# Patient Record
Sex: Female | Born: 1935 | Race: White | Hispanic: No | Marital: Married | Smoking: Former smoker
Health system: Southern US, Community
[De-identification: ages and names within clinical notes are randomized; demographics above are authoritative.]

## PROBLEM LIST (undated history)

## (undated) DIAGNOSIS — I10 Essential (primary) hypertension: Secondary | ICD-10-CM

## (undated) DIAGNOSIS — R32 Unspecified urinary incontinence: Secondary | ICD-10-CM

## (undated) DIAGNOSIS — F329 Major depressive disorder, single episode, unspecified: Secondary | ICD-10-CM

## (undated) DIAGNOSIS — F419 Anxiety disorder, unspecified: Secondary | ICD-10-CM

## (undated) DIAGNOSIS — F32A Depression, unspecified: Secondary | ICD-10-CM

## (undated) DIAGNOSIS — E785 Hyperlipidemia, unspecified: Secondary | ICD-10-CM

## (undated) DIAGNOSIS — M1990 Unspecified osteoarthritis, unspecified site: Secondary | ICD-10-CM

## (undated) DIAGNOSIS — G4733 Obstructive sleep apnea (adult) (pediatric): Secondary | ICD-10-CM

## (undated) DIAGNOSIS — F0390 Unspecified dementia without behavioral disturbance: Secondary | ICD-10-CM

## (undated) HISTORY — DX: Essential (primary) hypertension: I10

## (undated) HISTORY — DX: Hyperlipidemia, unspecified: E78.5

## (undated) HISTORY — DX: Major depressive disorder, single episode, unspecified: F32.9

## (undated) HISTORY — DX: Unspecified osteoarthritis, unspecified site: M19.90

## (undated) HISTORY — PX: NECK SURGERY: SHX720

## (undated) HISTORY — PX: ABDOMINAL HYSTERECTOMY: SHX81

## (undated) HISTORY — DX: Obstructive sleep apnea (adult) (pediatric): G47.33

## (undated) HISTORY — PX: APPENDECTOMY: SHX54

## (undated) HISTORY — DX: Unspecified dementia, unspecified severity, without behavioral disturbance, psychotic disturbance, mood disturbance, and anxiety: F03.90

## (undated) HISTORY — DX: Depression, unspecified: F32.A

## (undated) HISTORY — DX: Unspecified urinary incontinence: R32

## (undated) HISTORY — DX: Anxiety disorder, unspecified: F41.9

---

## 1998-03-18 ENCOUNTER — Other Ambulatory Visit: Admission: RE | Admit: 1998-03-18 | Discharge: 1998-03-18 | Payer: Self-pay | Admitting: Family Medicine

## 1999-04-23 ENCOUNTER — Other Ambulatory Visit: Admission: RE | Admit: 1999-04-23 | Discharge: 1999-04-23 | Payer: Self-pay | Admitting: Family Medicine

## 2000-01-12 ENCOUNTER — Encounter: Admission: RE | Admit: 2000-01-12 | Discharge: 2000-01-12 | Payer: Self-pay | Admitting: Family Medicine

## 2000-01-12 ENCOUNTER — Encounter: Payer: Self-pay | Admitting: Family Medicine

## 2000-06-27 ENCOUNTER — Other Ambulatory Visit: Admission: RE | Admit: 2000-06-27 | Discharge: 2000-06-27 | Payer: Self-pay | Admitting: Family Medicine

## 2001-01-12 ENCOUNTER — Encounter: Admission: RE | Admit: 2001-01-12 | Discharge: 2001-01-12 | Payer: Self-pay | Admitting: Internal Medicine

## 2001-01-12 ENCOUNTER — Encounter: Payer: Self-pay | Admitting: Internal Medicine

## 2001-02-15 ENCOUNTER — Encounter (INDEPENDENT_AMBULATORY_CARE_PROVIDER_SITE_OTHER): Payer: Self-pay | Admitting: Specialist

## 2001-02-15 ENCOUNTER — Emergency Department (HOSPITAL_COMMUNITY): Admission: EM | Admit: 2001-02-15 | Discharge: 2001-02-15 | Payer: Self-pay | Admitting: Emergency Medicine

## 2001-02-15 ENCOUNTER — Ambulatory Visit (HOSPITAL_COMMUNITY): Admission: RE | Admit: 2001-02-15 | Discharge: 2001-02-15 | Payer: Self-pay | Admitting: *Deleted

## 2001-10-26 ENCOUNTER — Other Ambulatory Visit: Admission: RE | Admit: 2001-10-26 | Discharge: 2001-10-26 | Payer: Self-pay | Admitting: Gynecology

## 2002-02-26 ENCOUNTER — Encounter: Payer: Self-pay | Admitting: Gynecology

## 2002-02-26 ENCOUNTER — Encounter: Admission: RE | Admit: 2002-02-26 | Discharge: 2002-02-26 | Payer: Self-pay | Admitting: Gynecology

## 2003-01-28 ENCOUNTER — Other Ambulatory Visit: Admission: RE | Admit: 2003-01-28 | Discharge: 2003-01-28 | Payer: Self-pay | Admitting: Gynecology

## 2003-03-03 ENCOUNTER — Encounter: Admission: RE | Admit: 2003-03-03 | Discharge: 2003-03-03 | Payer: Self-pay | Admitting: Gynecology

## 2003-03-03 ENCOUNTER — Encounter: Payer: Self-pay | Admitting: Gynecology

## 2003-06-07 LAB — HM COLONOSCOPY: HM Colonoscopy: NORMAL

## 2004-03-04 ENCOUNTER — Encounter: Admission: RE | Admit: 2004-03-04 | Discharge: 2004-03-04 | Payer: Self-pay | Admitting: Internal Medicine

## 2004-10-28 ENCOUNTER — Ambulatory Visit (HOSPITAL_COMMUNITY): Admission: RE | Admit: 2004-10-28 | Discharge: 2004-10-28 | Payer: Self-pay | Admitting: *Deleted

## 2004-12-17 ENCOUNTER — Encounter: Admission: RE | Admit: 2004-12-17 | Discharge: 2004-12-17 | Payer: Self-pay | Admitting: Internal Medicine

## 2005-03-21 ENCOUNTER — Encounter: Admission: RE | Admit: 2005-03-21 | Discharge: 2005-03-21 | Payer: Self-pay | Admitting: Internal Medicine

## 2005-04-14 ENCOUNTER — Other Ambulatory Visit: Admission: RE | Admit: 2005-04-14 | Discharge: 2005-04-14 | Payer: Self-pay | Admitting: Gynecology

## 2006-03-31 ENCOUNTER — Encounter: Admission: RE | Admit: 2006-03-31 | Discharge: 2006-03-31 | Payer: Self-pay | Admitting: Internal Medicine

## 2006-05-10 ENCOUNTER — Other Ambulatory Visit: Admission: RE | Admit: 2006-05-10 | Discharge: 2006-05-10 | Payer: Self-pay | Admitting: Gynecology

## 2007-01-03 ENCOUNTER — Encounter: Payer: Self-pay | Admitting: Pulmonary Disease

## 2007-02-22 ENCOUNTER — Encounter: Payer: Self-pay | Admitting: Pulmonary Disease

## 2007-07-19 ENCOUNTER — Encounter: Admission: RE | Admit: 2007-07-19 | Discharge: 2007-07-19 | Payer: Self-pay | Admitting: Internal Medicine

## 2008-01-07 ENCOUNTER — Telehealth: Payer: Self-pay | Admitting: Family Medicine

## 2008-03-19 ENCOUNTER — Encounter: Payer: Self-pay | Admitting: Family Medicine

## 2008-03-26 ENCOUNTER — Ambulatory Visit: Payer: Self-pay | Admitting: Family Medicine

## 2008-03-26 DIAGNOSIS — G43009 Migraine without aura, not intractable, without status migrainosus: Secondary | ICD-10-CM | POA: Insufficient documentation

## 2008-03-26 DIAGNOSIS — E785 Hyperlipidemia, unspecified: Secondary | ICD-10-CM

## 2008-03-26 DIAGNOSIS — J309 Allergic rhinitis, unspecified: Secondary | ICD-10-CM | POA: Insufficient documentation

## 2008-03-26 DIAGNOSIS — I10 Essential (primary) hypertension: Secondary | ICD-10-CM

## 2008-04-09 ENCOUNTER — Ambulatory Visit: Payer: Self-pay | Admitting: Family Medicine

## 2008-04-09 DIAGNOSIS — M545 Low back pain: Secondary | ICD-10-CM

## 2008-04-16 ENCOUNTER — Telehealth: Payer: Self-pay | Admitting: Family Medicine

## 2008-06-16 ENCOUNTER — Ambulatory Visit: Payer: Self-pay | Admitting: Family Medicine

## 2008-06-16 LAB — CONVERTED CEMR LAB
ALT: 19 units/L (ref 0–35)
AST: 21 units/L (ref 0–37)
Albumin: 3.5 g/dL (ref 3.5–5.2)
Alkaline Phosphatase: 63 units/L (ref 39–117)
BUN: 19 mg/dL (ref 6–23)
Calcium: 9.4 mg/dL (ref 8.4–10.5)
Cholesterol: 276 mg/dL (ref 0–200)
Creatinine, Ser: 0.7 mg/dL (ref 0.4–1.2)
GFR calc Af Amer: 105 mL/min
GFR calc non Af Amer: 86 mL/min
Glucose, Bld: 98 mg/dL (ref 70–99)
Sodium: 140 meq/L (ref 135–145)
Triglycerides: 198 mg/dL — ABNORMAL HIGH (ref 0–149)

## 2008-06-20 ENCOUNTER — Ambulatory Visit: Payer: Self-pay | Admitting: Family Medicine

## 2008-06-20 LAB — CONVERTED CEMR LAB: Cholesterol, target level: 200 mg/dL

## 2008-07-21 ENCOUNTER — Encounter: Admission: RE | Admit: 2008-07-21 | Discharge: 2008-07-21 | Payer: Self-pay | Admitting: Family Medicine

## 2008-07-22 ENCOUNTER — Encounter (INDEPENDENT_AMBULATORY_CARE_PROVIDER_SITE_OTHER): Payer: Self-pay | Admitting: *Deleted

## 2008-07-31 ENCOUNTER — Encounter (INDEPENDENT_AMBULATORY_CARE_PROVIDER_SITE_OTHER): Payer: Self-pay | Admitting: *Deleted

## 2008-08-08 ENCOUNTER — Telehealth: Payer: Self-pay | Admitting: Family Medicine

## 2008-09-26 ENCOUNTER — Ambulatory Visit: Payer: Self-pay | Admitting: Family Medicine

## 2008-09-26 DIAGNOSIS — F341 Dysthymic disorder: Secondary | ICD-10-CM

## 2008-09-29 LAB — CONVERTED CEMR LAB
ALT: 21 units/L (ref 0–35)
AST: 26 units/L (ref 0–37)
Alkaline Phosphatase: 65 units/L (ref 39–117)
Bilirubin, Direct: 0.1 mg/dL (ref 0.0–0.3)
Folate: >20 ng/mL
TSH: 1.35 microintl units/mL (ref 0.35–5.50)
Total Bilirubin: 0.8 mg/dL (ref 0.3–1.2)
Total Protein: 7 g/dL (ref 6.0–8.3)
Vitamin B-12: 906 pg/mL (ref 211–911)

## 2009-04-08 ENCOUNTER — Ambulatory Visit: Payer: Self-pay | Admitting: Family Medicine

## 2009-04-08 DIAGNOSIS — F0390 Unspecified dementia without behavioral disturbance: Secondary | ICD-10-CM | POA: Insufficient documentation

## 2009-04-09 ENCOUNTER — Encounter: Payer: Self-pay | Admitting: Family Medicine

## 2009-04-09 LAB — CONVERTED CEMR LAB
AST: 29 units/L (ref 0–37)
Albumin: 3.6 g/dL (ref 3.5–5.2)
Alkaline Phosphatase: 68 units/L (ref 39–117)
Basophils Relative: 0.5 % (ref 0.0–3.0)
CO2: 30 meq/L (ref 19–32)
Chloride: 103 meq/L (ref 96–112)
Direct LDL: 213.3 mg/dL
Folate: >20 ng/mL
HCT: 42.5 % (ref 36.0–46.0)
HDL: 36.9 mg/dL — ABNORMAL LOW (ref >39.00)
Hemoglobin: 14.5 g/dL (ref 12.0–15.0)
Lymphocytes Relative: 34.3 % (ref 12.0–46.0)
MCHC: 34.1 g/dL (ref 30.0–36.0)
MCV: 92.3 fL (ref 78.0–100.0)
Monocytes Absolute: 0.7 10*3/uL (ref 0.1–1.0)
Monocytes Relative: 15.5 % — ABNORMAL HIGH (ref 3.0–12.0)
Neutrophils Relative %: 45.4 % (ref 43.0–77.0)
Potassium: 3.9 meq/L (ref 3.5–5.1)
RBC: 4.61 M/uL (ref 3.87–5.11)
RDW: 12.8 % (ref 11.5–14.6)
Total Bilirubin: 0.6 mg/dL (ref 0.3–1.2)
Total Protein: 7.3 g/dL (ref 6.0–8.3)
Triglycerides: 164 mg/dL — ABNORMAL HIGH (ref 0.0–149.0)
Vitamin B-12: 921 pg/mL — ABNORMAL HIGH (ref 211–911)

## 2009-04-10 LAB — CONVERTED CEMR LAB: Vit D, 25-Hydroxy: 33 ng/mL (ref 30–89)

## 2009-04-17 ENCOUNTER — Ambulatory Visit: Payer: Self-pay | Admitting: Pulmonary Disease

## 2009-04-17 DIAGNOSIS — G4733 Obstructive sleep apnea (adult) (pediatric): Secondary | ICD-10-CM

## 2009-04-22 ENCOUNTER — Other Ambulatory Visit: Admission: RE | Admit: 2009-04-22 | Discharge: 2009-04-22 | Payer: Self-pay | Admitting: Gynecology

## 2009-04-22 ENCOUNTER — Encounter: Payer: Self-pay | Admitting: Family Medicine

## 2009-04-22 ENCOUNTER — Ambulatory Visit: Payer: Self-pay | Admitting: Gynecology

## 2009-05-04 ENCOUNTER — Encounter (INDEPENDENT_AMBULATORY_CARE_PROVIDER_SITE_OTHER): Payer: Self-pay | Admitting: *Deleted

## 2009-05-04 LAB — CONVERTED CEMR LAB
ALT: 15 units/L
Cholesterol: 307 mg/dL
HDL: 33 mg/dL

## 2009-05-14 ENCOUNTER — Ambulatory Visit: Payer: Self-pay | Admitting: Family Medicine

## 2009-05-22 ENCOUNTER — Telehealth: Payer: Self-pay | Admitting: Family Medicine

## 2009-06-25 ENCOUNTER — Encounter: Payer: Self-pay | Admitting: Pulmonary Disease

## 2009-07-06 ENCOUNTER — Encounter: Payer: Self-pay | Admitting: Pulmonary Disease

## 2009-07-13 ENCOUNTER — Ambulatory Visit: Payer: Self-pay | Admitting: Pulmonary Disease

## 2009-07-16 ENCOUNTER — Ambulatory Visit: Payer: Self-pay | Admitting: Family Medicine

## 2009-07-22 LAB — CONVERTED CEMR LAB
ALT: 25 units/L (ref 0–35)
AST: 25 units/L (ref 0–37)
HDL: 44.1 mg/dL (ref >39.00)
Total CHOL/HDL Ratio: 6
Triglycerides: 228 mg/dL — ABNORMAL HIGH (ref 0.0–149.0)

## 2009-07-29 ENCOUNTER — Encounter: Admission: RE | Admit: 2009-07-29 | Discharge: 2009-07-29 | Payer: Self-pay | Admitting: Family Medicine

## 2009-08-04 ENCOUNTER — Ambulatory Visit: Payer: Self-pay | Admitting: Gynecology

## 2009-08-10 ENCOUNTER — Telehealth (INDEPENDENT_AMBULATORY_CARE_PROVIDER_SITE_OTHER): Payer: Self-pay | Admitting: *Deleted

## 2009-08-14 ENCOUNTER — Telehealth: Payer: Self-pay | Admitting: Family Medicine

## 2009-08-20 ENCOUNTER — Encounter: Payer: Self-pay | Admitting: Family Medicine

## 2009-08-21 ENCOUNTER — Ambulatory Visit: Payer: Self-pay | Admitting: Family Medicine

## 2009-08-21 ENCOUNTER — Telehealth: Payer: Self-pay | Admitting: Family Medicine

## 2009-08-28 ENCOUNTER — Ambulatory Visit: Payer: Self-pay | Admitting: Family Medicine

## 2009-10-19 ENCOUNTER — Ambulatory Visit: Payer: Self-pay | Admitting: Family Medicine

## 2009-10-20 LAB — CONVERTED CEMR LAB
AST: 20 units/L (ref 0–37)
Direct LDL: 109.4 mg/dL
HDL: 43.7 mg/dL (ref >39.00)
VLDL: 44 mg/dL — ABNORMAL HIGH (ref 0.0–40.0)

## 2009-10-28 ENCOUNTER — Telehealth: Payer: Self-pay | Admitting: Family Medicine

## 2009-11-04 ENCOUNTER — Encounter: Payer: Self-pay | Admitting: Pulmonary Disease

## 2009-11-16 ENCOUNTER — Ambulatory Visit: Payer: Self-pay | Admitting: Family Medicine

## 2009-11-16 DIAGNOSIS — K219 Gastro-esophageal reflux disease without esophagitis: Secondary | ICD-10-CM | POA: Insufficient documentation

## 2010-01-12 ENCOUNTER — Telehealth: Payer: Self-pay | Admitting: Pulmonary Disease

## 2010-01-15 ENCOUNTER — Telehealth: Payer: Self-pay | Admitting: Pulmonary Disease

## 2010-02-03 ENCOUNTER — Ambulatory Visit: Payer: Self-pay | Admitting: Pulmonary Disease

## 2010-04-20 ENCOUNTER — Ambulatory Visit: Payer: Self-pay | Admitting: Family Medicine

## 2010-04-20 LAB — CONVERTED CEMR LAB
Bilirubin Urine: NEGATIVE
Blood in Urine, dipstick: NEGATIVE
Glucose, Urine, Semiquant: NEGATIVE
Ketones, urine, test strip: NEGATIVE

## 2010-04-21 LAB — CONVERTED CEMR LAB
ALT: 20 units/L (ref 0–35)
Alkaline Phosphatase: 70 units/L (ref 39–117)
BUN: 16 mg/dL (ref 6–23)
Cholesterol: 227 mg/dL — ABNORMAL HIGH (ref 0–200)
Creatinine, Ser: 1 mg/dL (ref 0.4–1.2)
GFR calc non Af Amer: 60.45 mL/min (ref >60)
Glucose, Bld: 88 mg/dL (ref 70–99)
HDL: 47.8 mg/dL (ref >39.00)
Potassium: 4.4 meq/L (ref 3.5–5.1)
Total Bilirubin: 0.4 mg/dL (ref 0.3–1.2)
Total Protein: 6.6 g/dL (ref 6.0–8.3)
Triglycerides: 189 mg/dL — ABNORMAL HIGH (ref 0.0–149.0)
VLDL: 37.8 mg/dL (ref 0.0–40.0)

## 2010-06-24 ENCOUNTER — Telehealth (INDEPENDENT_AMBULATORY_CARE_PROVIDER_SITE_OTHER): Payer: Self-pay | Admitting: *Deleted

## 2010-06-26 ENCOUNTER — Other Ambulatory Visit: Payer: Self-pay | Admitting: Family Medicine

## 2010-06-26 DIAGNOSIS — Z1239 Encounter for other screening for malignant neoplasm of breast: Secondary | ICD-10-CM

## 2010-06-27 ENCOUNTER — Encounter: Payer: Self-pay | Admitting: Gynecology

## 2010-06-29 ENCOUNTER — Ambulatory Visit
Admission: RE | Admit: 2010-06-29 | Discharge: 2010-06-29 | Payer: Self-pay | Source: Home / Self Care | Attending: Family Medicine | Admitting: Family Medicine

## 2010-06-29 DIAGNOSIS — R51 Headache: Secondary | ICD-10-CM | POA: Insufficient documentation

## 2010-06-29 DIAGNOSIS — R519 Headache, unspecified: Secondary | ICD-10-CM | POA: Insufficient documentation

## 2010-07-01 ENCOUNTER — Encounter
Admission: RE | Admit: 2010-07-01 | Discharge: 2010-07-01 | Payer: Self-pay | Source: Home / Self Care | Attending: Family Medicine | Admitting: Family Medicine

## 2010-07-06 NOTE — Progress Notes (Signed)
Summary: Medical Records requested...  Phone Note From Other Clinic   Summary of Call: Pt was referred to [facility] (Neurologis) for Memory Loss on  March 25th.Marland KitchenMarland KitchenPt was seen by the Dr. Quentin Mulling  on Apri 5th. Pt was not orginally referred by Dr. Campbell Lerner per [facility],   they were not authorizated to send Medical Records to our office. Called pt to request Medical Records.Daine Gip  Oct 28, 2009 3:12 PM     Initial call taken by: Daine Gip,  Oct 28, 2009 3:12 PM

## 2010-07-06 NOTE — Progress Notes (Signed)
Summary: pain in left breast   Phone Note Call from Patient   Caller: Patient Call For: Kerby Nora MD Summary of Call: Patient says that she has been having a cramping pain in her left breast, no SOB, it has been happening for about a week at random times of the day. She says that after she takes about 7 tums the pain stops after about 20 min. Wants to know if she needs to be seen for this.  Please advise. Initial call taken by: Melody Comas,  August 14, 2009 1:49 PM  Follow-up for Phone Call        call patient and discuss, improved with TUMS  f/u Dr. Josie Dixon is reasonable Follow-up by: Hannah Beat MD,  August 14, 2009 2:28 PM  Additional Follow-up for Phone Call Additional follow up Details #1::        Spoke with pt's husband, he said pt gets confused.  Advised him to have pt call to schedule appt for next week. Additional Follow-up by: Lowella Petties CMA,  August 14, 2009 2:34 PM    Additional Follow-up for Phone Call Additional follow up Details #2::    Spoke with patient.  She is having no SOB, no chest pain, no wheezing.  No specific symptoms except pain in her breast, she doesn't drink caffeine.  Advised patient if her symptoms get any worse or she develops SOB, wheezing, chest pain, or her symptoms changed over the weekend to go to the ER.  Let us know if she doesn't get any better before her appt on 08/21/2009 with Dr. Ermalene Searing.  Linde Gillis CMA Duncan Dull)  August 14, 2009 2:43 PM

## 2010-07-06 NOTE — Letter (Signed)
Summary: CMN for CPAP Supplies/[facility]  CMN for CPAP Supplies/[facility]   Imported By: Sherian Rein 07/07/2009 09:38:52  _____________________________________________________________________  External Attachment:    Type:   Image     Comment:   External Document

## 2010-07-06 NOTE — Letter (Signed)
Summary: CMN/[facility]  CMN/[facility]   Imported By: Lester Tacna 11/10/2009 11:01:18  _____________________________________________________________________  External Attachment:    Type:   Image     Comment:   External Document

## 2010-07-06 NOTE — Assessment & Plan Note (Signed)
Summary: rov for osa   Copy to:  Kerby Nora Primary Provider/Referring Provider:  Kerby Nora MD  CC:  Pt is here for a 6 month f/u appt on her OSA.  Pt states she is wearing her cpap machine every night.  Pt states she set her cpap machine to read "4" at night when she uses it.  Pt wears her cpap machine approx 5 to 8 hours per night.  Pt c/o waking up with headaches.  Pt does c/o nasal dryness.  Pt denied sore throat. Marland Kitchen  History of Present Illness: The pt comes in today for f/iu of her osa.  We never received her download for pressure optimization, but have obtained today.  She has had adequate compliance, and her pressure needs to be 14cm of water.  The pt continues to have am headaches, but does not feel her mask is too tight.  She has slept better on cpap, but unsure if it has made a significant difference.  I have reminded her it needs to be set at optimal pressure.  She has lost 5 pounds since last visit.  Current Medications (verified): 1)  Fexofenadine Hcl 180 Mg Tabs (Fexofenadine Hcl) .... Take 1 Tablet By Mouth Once A Day 2)  Amlodipine Besy-Benazepril Hcl 5-10 Mg Caps (Amlodipine Besy-Benazepril Hcl) .... Take 1 Tablet By Mouth Once A Day 3)  Lovaza 1 Gm Caps (Omega-3-Acid Ethyl Esters) .... Take 4 Capsules By Mouth Daily 4)  Aspirin 81 Mg  Tabs (Aspirin) .... Take 2 By Mouth Once Daily 5)  Multivitamins   Tabs (Multiple Vitamin) .... Take 1 Tablet By Mouth Once A Day 6)  Calcium Carbonate-Vitamin D 600-400 Mg-Unit  Tabs (Calcium Carbonate-Vitamin D) .... Take 2 By Mouth Once Daily 7)  Glucosamine-Chondroitin   Caps (Glucosamine-Chondroit-Vit C-Mn) .... Take 2 By Mouth Once Daily 8)  Simvastatin 40 Mg Tabs (Simvastatin) .... Take 1 Tablet By Mouth Once A Day 9)  Lecithin 1200 Mg Caps (Lecithin) .... Takes 2 Tabs Daily 10)  Omeprazole 40 Mg Cpdr (Omeprazole) .Marland Kitchen.. 1 Tab By Mouth Daily 11)  Sertraline Hcl 50 Mg Tabs (Sertraline Hcl) .Marland Kitchen.. 1 Tab By Mouth At Bedtime 12)  Paroxetine Hcl  10 Mg Tabs (Paroxetine Hcl) .... Take 1 Tablet By Mouth Once A Day  Allergies (verified): 1)  ! Sulfa  Review of Systems       The patient complains of non-productive cough and headaches.  The patient denies shortness of breath with activity, shortness of breath at rest, productive cough, coughing up blood, chest pain, irregular heartbeats, acid heartburn, indigestion, loss of appetite, weight change, abdominal pain, difficulty swallowing, sore throat, tooth/dental problems, nasal congestion/difficulty breathing through nose, sneezing, itching, ear ache, anxiety, depression, hand/feet swelling, joint stiffness or pain, rash, change in color of mucus, and fever.    Vital Signs:  Patient profile:   77 year old female Height:      63 inches Weight:      182.25 pounds BMI:     32.40 O2 Sat:      95 % on Room air Temp:     98.1 degrees F oral Pulse rate:   83 / minute BP sitting:   114 / 70  (left arm) Cuff size:   regular  Vitals Entered By: Arman Filter LPN (February 03, 2010 9:45 AM)  O2 Flow:  Room air CC: Pt is here for a 6 month f/u appt on her OSA.  Pt states she is wearing her cpap machine every  night.  Pt states she set her cpap machine to read "4" at night when she uses it.  Pt wears her cpap machine approx 5 to 8 hours per night.  Pt c/o waking up with headaches.  Pt does c/o nasal dryness.  Pt denied sore throat.  Comments Medications reviewed with patient Arman Filter LPN  February 03, 2010 9:45 AM    Physical Exam  General:  obese female in nad Nose:  no skin breakdown or pressure necrosis from cpap mask Extremities:  mild edema noted, no cyanosis  Neurologic:  alert and oriented, does not appear sleepy, moves all 4.   Impression & Recommendations:  Problem # 1:  OBSTRUCTIVE SLEEP APNEA (ICD-327.23) the pt has been wearing cpap compliantly, but needs a higher pressure than where she is currently set.  Will have her dme increase her pressure to 14cm, and see how she  responds.  I have also encouraged her to work on modest weight loss as well.  If the pt continues to have am headaches despite wearing cpap at appropriate pressure, will need to consider other causes.  Medications Added to Medication List This Visit: 1)  Paroxetine Hcl 10 Mg Tabs (Paroxetine hcl) .... Take 1 tablet by mouth once a day  Other Orders: Est. Patient Level III (09811) DME Referral (DME)  Patient Instructions: 1)  will have your equipment company set your machine pressure to 14cm. 2)  if you continue to have morning headaches, despite wearing cpap at the appropriate pressure, would discuss with Dr Ermalene Searing other potential causes.   3)  work on weight loss 4)  followup with me in 1 year, but call if having cpap/sleep issues.

## 2010-07-06 NOTE — Progress Notes (Signed)
Summary: nos appt  Phone Note Call from Patient   Caller: [email or web address]  Call For: clance Summary of Call: Rsc nos from 8/11 to 8/31 @ 9:45a. Initial call taken by: Darletta Moll,  January 15, 2010 1:16 PM

## 2010-07-06 NOTE — Assessment & Plan Note (Signed)
Summary: 1 wk f/u dlo   Vital Signs:  Patient profile:   77 year old female Height:      63 inches Weight:      185.0 pounds BMI:     32.89 Temp:     97.8 degrees F oral Pulse rate:   80 / minute Pulse rhythm:   regular BP sitting:   120 / 80  (left arm) Cuff size:   large  Vitals Entered By: Benny Lennert CMA Duncan Dull) (August 28, 2009 9:33 AM)  History of Present Illness: Chief complaint 1 week follow up  Chest Pain, atypical: improved chest pain and stomach issues. Using PPI  Dementia, poor control per husband. Patient has noted as well.  Initial work up negative. Has neurologist... Dr. Ferne Reus. Difficulty finding places..for example finding office this AM. Repeaeting questions. Confusion about meds.  MMSE 25/30  Sleep apnea..now on CPAP  Depression, poor controll off sertraline.   Problems Prior to Update: 1)  Chest Pain, Atypical  (ICD-786.59) 2)  Breast Pain, Bilateral  (ICD-611.71) 3)  Memory Loss  (ICD-780.93) 4)  Anxiety Depression  (ICD-300.4) 5)  Other Screening Mammogram  (ICD-V76.12) 6)  Low Back Pain, Acute  (ICD-724.2) 7)  Hypertension  (ICD-401.9) 8)  Hyperlipidemia  (ICD-272.4) 9)  Allergic Rhinitis  (ICD-477.9) 10)  Obstructive Sleep Apnea  (ICD-327.23) 11)  Common Migraine  (ICD-346.10)  Current Medications (verified): 1)  Fexofenadine Hcl 180 Mg Tabs (Fexofenadine Hcl) .... Take 1 Tablet By Mouth Once A Day 2)  Amlodipine Besy-Benazepril Hcl 5-10 Mg Caps (Amlodipine Besy-Benazepril Hcl) .... Take 1 Tablet By Mouth Once A Day 3)  Lovaza 1 Gm Caps (Omega-3-Acid Ethyl Esters) .... Take 4 Capsules By Mouth Daily 4)  Aspirin 81 Mg  Tabs (Aspirin) .... Take 2 By Mouth Once Daily 5)  Multivitamins   Tabs (Multiple Vitamin) .... Take 1 Tablet By Mouth Once A Day 6)  Calcium Carbonate-Vitamin D 600-400 Mg-Unit  Tabs (Calcium Carbonate-Vitamin D) .... Take 2 By Mouth Once Daily 7)  Glucosamine-Chondroitin   Caps (Glucosamine-Chondroit-Vit C-Mn) .... Take 2  By Mouth Once Daily 8)  Simvastatin 40 Mg Tabs (Simvastatin) .... Take 1 Tablet By Mouth Once A Day 9)  Lecithin 1200 Mg Caps (Lecithin) .... Takes 2 Tabs Daily 10)  Omeprazole 40 Mg Cpdr (Omeprazole) .Marland Kitchen.. 1 Tab By Mouth Daily 11)  Sertraline Hcl 50 Mg Tabs (Sertraline Hcl) .Marland Kitchen.. 1 Tab By Mouth At Bedtime  Allergies: 1)  ! Sulfa  Past History:  Past medical, surgical, family and social histories (including risk factors) reviewed, and no changes noted (except as noted below).  Past Medical History: Reviewed history from 04/17/2009 and no changes required.  MEMORY LOSS (ICD-780.93) ANXIETY DEPRESSION (ICD-300.4) LOW BACK PAIN, ACUTE (ICD-724.2) HYPERTENSION (ICD-401.9) HYPERLIPIDEMIA (ICD-272.4) ALLERGIC RHINITIS (ICD-477.9) OBSTRUCTIVE SLEEP APNEA (ICD-327.23) COMMON MIGRAINE (ICD-346.10)    Past Surgical History: Reviewed history from 04/17/2009 and no changes required. sleep study 03/2008 partial hysterectomy, for fibroids Appendectomy neck surgery   Family History: Reviewed history from 03/26/2008 and no changes required. father: MI age 41s, alcoholic mother: lymphoma brother: no contact MGM: CAD MGF: sudden death  Social History: Reviewed history from 04/17/2009 and no changes required. Retired Married 26 years, husband 21 years younger pt has children. pt is a former smoker.  started smoking around age 46.  approx 2 ppd.  smoked x 8 years.  Alcohol use-no Drug use-no Regular exercise-no Diet: working on healthy eating, eats a lot of Slim Fast  Review of Systems General:  Denies fatigue and fever. CV:  Denies fainting and swelling of feet. Resp:  Denies shortness of breath. GI:  Denies abdominal pain. GU:  Denies dysuria.  Physical Exam  General:  pleasnt overweight feamle iN NAD Mouth:  MMM Neck:  no carotid bruit or thyromegaly no cervical or supraclavicular lymphadenopathy  Lungs:  Normal respiratory effort, chest expands symmetrically. Lungs are  clear to auscultation, no crackles or wheezes. Heart:  Normal rate and regular rhythm. S1 and S2 normal without gallop, murmur, click, rub or other extra sounds. Abdomen:  epigastric ttp, soft, normal bowel sounds, no distention, no masses, no guarding, no rebound tenderness, no hepatomegaly, and no splenomegaly.   Neurologic:  No cranial nerve deficits noted. Station and gait are normal. Plantar reflexes are down-going bilaterally. DTRs are symmetrical throughout. Sensory, motor and coordinative functions appear intact. oriented to self and place NOT TIME Psych:  Oriented X3, memory intact for recent and remote, and tearful.     Impression & Recommendations:  Problem # 1:  CHEST PAIN, ATYPICAL (ICD-786.59) Improving off diclofenac and on PPI. Continue.   Problem # 2:  MEMORY LOSS (ICD-780.93) Early onset..fairly quickly progressive in last 6-9 months per husband. LAb eval negative. Will refer to neuro for further eval.  Sleep apnea now treated.  Orders: Neurology Referral (Neuro)  Problem # 3:  ANXIETY DEPRESSION (ICD-300.4) Restart sertraline at bedtime. Follow up in 1 month.   Complete Medication List: 1)  Fexofenadine Hcl 180 Mg Tabs (Fexofenadine hcl) .... Take 1 tablet by mouth once a day 2)  Amlodipine Besy-benazepril Hcl 5-10 Mg Caps (Amlodipine besy-benazepril hcl) .... Take 1 tablet by mouth once a day 3)  Lovaza 1 Gm Caps (Omega-3-acid ethyl esters) .... Take 4 capsules by mouth daily 4)  Aspirin 81 Mg Tabs (Aspirin) .... Take 2 by mouth once daily 5)  Multivitamins Tabs (Multiple vitamin) .... Take 1 tablet by mouth once a day 6)  Calcium Carbonate-vitamin D 600-400 Mg-unit Tabs (Calcium carbonate-vitamin d) .... Take 2 by mouth once daily 7)  Glucosamine-chondroitin Caps (Glucosamine-chondroit-vit c-mn) .... Take 2 by mouth once daily 8)  Simvastatin 40 Mg Tabs (Simvastatin) .... Take 1 tablet by mouth once a day 9)  Lecithin 1200 Mg Caps (Lecithin) .... Takes 2 tabs  daily 10)  Omeprazole 40 Mg Cpdr (Omeprazole) .Marland Kitchen.. 1 tab by mouth daily 11)  Sertraline Hcl 50 Mg Tabs (Sertraline hcl) .Marland Kitchen.. 1 tab by mouth at bedtime  Patient Instructions: 1)  Please schedule a follow-up appointment in 1 month 30 min OV. 2)  Continue omeprazole and restart sertraline for depression. 3)  Referral Appointment Information 4)  Day/Date: 5)  Time: 6)  Place/MD: 7)  Address: 8)  Phone/Fax: 9)  Patient given appointment information. Information/Orders faxed/mailed.  Prescriptions: SERTRALINE HCL 50 MG TABS (SERTRALINE HCL) 1 tab by mouth at bedtime  #30 x 5   Entered and Authorized by:   Kerby Nora MD   Signed by:   Kerby Nora MD on 08/28/2009   Method used:   [facility] to        [facility]  [address] [phone]ail)       [address]       [place], [place]  [address]       Ph: [phone]       Fax: [phone]   RxID:   [id number]   Current Allergies (reviewed today): ! SULFA

## 2010-07-06 NOTE — Assessment & Plan Note (Signed)
Summary: PAIN IN BREAST/NT   Vital Signs:  Patient profile:   77 year old female Height:      63 inches Weight:      182.8 pounds BMI:     32.50 Temp:     98.1 degrees F oral Pulse rate:   87 / minute Pulse rhythm:   regular BP sitting:   126 / 90  (left arm) Cuff size:   large  Vitals Entered By: Benny Lennert CMA Duncan Dull) (August 21, 2009 8:44 AM)  History of Present Illness: Chief complaint pain in breast and stomach   Central chest pain radiates in B breasts...gradual onset..worse in last 2 days.  Not sure how long prior. Lasts 2 hours.  OCc better with TUMS.  No relationship to food or exertion. No nausea, no burping, no vomiting. No SOB. no radiation to neck or arm.  Recently found diclofenac and cyclobenzaprine ..thought she should be taking these so restarted. Not sure when restarted these meds.   Last 07/2009 no breast lesions.  Has not had recent  stress test per records and pt memory.   Problems Prior to Update: 1)  Memory Loss  (ICD-780.93) 2)  Anxiety Depression  (ICD-300.4) 3)  Other Screening Mammogram  (ICD-V76.12) 4)  Low Back Pain, Acute  (ICD-724.2) 5)  Hypertension  (ICD-401.9) 6)  Hyperlipidemia  (ICD-272.4) 7)  Allergic Rhinitis  (ICD-477.9) 8)  Obstructive Sleep Apnea  (ICD-327.23) 9)  Common Migraine  (ICD-346.10)  Current Medications (verified): 1)  Fexofenadine Hcl 180 Mg Tabs (Fexofenadine Hcl) .... Take 1 Tablet By Mouth Once A Day 2)  Amlodipine Besy-Benazepril Hcl 5-10 Mg Caps (Amlodipine Besy-Benazepril Hcl) .... Take 1 Tablet By Mouth Once A Day 3)  Lovaza 1 Gm Caps (Omega-3-Acid Ethyl Esters) .... Take 4 Capsules By Mouth Daily 4)  Aspirin 81 Mg  Tabs (Aspirin) .... Take 2 By Mouth Once Daily 5)  Multivitamins   Tabs (Multiple Vitamin) .... Take 1 Tablet By Mouth Once A Day 6)  Calcium Carbonate-Vitamin D 600-400 Mg-Unit  Tabs (Calcium Carbonate-Vitamin D) .... Take 2 By Mouth Once Daily 7)  Glucosamine-Chondroitin   Caps  (Glucosamine-Chondroit-Vit C-Mn) .... Take 2 By Mouth Once Daily 8)  Simvastatin 40 Mg Tabs (Simvastatin) .... Take 1 Tablet By Mouth Once A Day 9)  Lecithin 1200 Mg Caps (Lecithin) .... Takes 2 Tabs Daily 10)  Omeprazole 40 Mg Cpdr (Omeprazole) .Marland Kitchen.. 1 Tab By Mouth Daily  Allergies: 1)  ! Sulfa  Past History:  Past medical, surgical, family and social histories (including risk factors) reviewed, and no changes noted (except as noted below).  Past Medical History: Reviewed history from 04/17/2009 and no changes required.  MEMORY LOSS (ICD-780.93) ANXIETY DEPRESSION (ICD-300.4) LOW BACK PAIN, ACUTE (ICD-724.2) HYPERTENSION (ICD-401.9) HYPERLIPIDEMIA (ICD-272.4) ALLERGIC RHINITIS (ICD-477.9) OBSTRUCTIVE SLEEP APNEA (ICD-327.23) COMMON MIGRAINE (ICD-346.10)    Past Surgical History: Reviewed history from 04/17/2009 and no changes required. sleep study 03/2008 partial hysterectomy, for fibroids Appendectomy neck surgery   Family History: Reviewed history from 03/26/2008 and no changes required. father: MI age 57s, alcoholic mother: lymphoma brother: no contact MGM: CAD MGF: sudden death  Social History: Reviewed history from 04/17/2009 and no changes required. Retired Married 26 years, husband 21 years younger pt has children. pt is a former smoker.  started smoking around age 49.  approx 2 ppd.  smoked x 8 years.  Alcohol use-no Drug use-no Regular exercise-no Diet: working on healthy eating, eats a lot of Slim Fast  Review of Systems General:  Denies fatigue and fever. CV:  Complains of chest pain or discomfort; denies swelling of feet. Resp:  Denies shortness of breath. GI:  Denies constipation and diarrhea. GU:  Denies dysuria.  Physical Exam  General:  pleasnt overweight feamle iN NAD Mouth:  MMM Neck:  no carotid bruit or thyromegaly no cervical or supraclavicular lymphadenopathy  Chest Wall:  No deformities, masses, or tenderness noted. Breasts:  No  mass, nodules, thickening, tenderness, bulging, retraction, inflamation, nipple discharge or skin changes noted.   Lungs:  Normal respiratory effort, chest expands symmetrically. Lungs are clear to auscultation, no crackles or wheezes. Heart:  Normal rate and regular rhythm. S1 and S2 normal without gallop, murmur, click, rub or other extra sounds. Abdomen:  epigastric ttp, soft, normal bowel sounds, no distention, no masses, no guarding, no rebound tenderness, no hepatomegaly, and no splenomegaly.     Impression & Recommendations:  Problem # 1:  CHEST PAIN, ATYPICAL (ICD-786.59) Given symptoms likely GERD, gastritis. EKG: artifact, but no ST changes, no Q.   Stop diclofecac as may have triggered.  Start PPI, change diet. Follow up in 1 week.  If continued pain given pt age..will consider stress test at that time.  Orders: EKG w/ Interpretation (93000)  Problem # 2:  BREAST PAIN, BILATERAL (ICD-611.71) NMl breast exam, recent normal mammogram ..pain does not seem consistent with breast source.   Complete Medication List: 1)  Fexofenadine Hcl 180 Mg Tabs (Fexofenadine hcl) .... Take 1 tablet by mouth once a day 2)  Amlodipine Besy-benazepril Hcl 5-10 Mg Caps (Amlodipine besy-benazepril hcl) .... Take 1 tablet by mouth once a day 3)  Lovaza 1 Gm Caps (Omega-3-acid ethyl esters) .... Take 4 capsules by mouth daily 4)  Aspirin 81 Mg Tabs (Aspirin) .... Take 2 by mouth once daily 5)  Multivitamins Tabs (Multiple vitamin) .... Take 1 tablet by mouth once a day 6)  Calcium Carbonate-vitamin D 600-400 Mg-unit Tabs (Calcium carbonate-vitamin d) .... Take 2 by mouth once daily 7)  Glucosamine-chondroitin Caps (Glucosamine-chondroit-vit c-mn) .... Take 2 by mouth once daily 8)  Simvastatin 40 Mg Tabs (Simvastatin) .... Take 1 tablet by mouth once a day 9)  Lecithin 1200 Mg Caps (Lecithin) .... Takes 2 tabs daily 10)  Omeprazole 40 Mg Cpdr (Omeprazole) .Marland Kitchen.. 1 tab by mouth daily  Patient  Instructions: 1)  Stop diclofenac  and cyclobenzaprine (threw bottles away in office) 2)  Start omeprazole for gastritis, reflux. 3)  Follow up in 1 week, call earlier if new symptoms. Go to ER if severe chest pain.  Prescriptions: OMEPRAZOLE 40 MG CPDR (OMEPRAZOLE) 1 tab by mouth daily  #30 x 3   Entered and Authorized by:   Kerby Nora MD   Signed by:   Kerby Nora MD on 08/21/2009   Method used:   [facility] to        [facility]  [address] [phone][facility] (retail)       [address]       [place], [place]  [address]       Ph: [phone]       Fax: [phone]   RxID:   [id number]   Current Allergies (reviewed today): ! SULFA

## 2010-07-06 NOTE — Assessment & Plan Note (Signed)
Summary: UTI/RBH   Vital Signs:  Patient profile:   77 year old female Height:      63 inches Weight:      183.25 pounds BMI:     32.58 Temp:     98.6 degrees F oral Pulse rate:   80 / minute Pulse rhythm:   regular BP sitting:   130 / 78  (left arm) Cuff size:   large  Vitals Entered By: Delilah Shan CMA  Dull) (April 20, 2010 8:18 AM) CC: ? UTI   History of Present Illness: H/o backache.  "I never have a backache."  Started on a cruise to [place].  Went with family.  Plane ride to [place] before the trip.  No burning with urination.  Nothing makes the back pain better or worse.  No FCNAV.  H/o rare UTI.  B lower back pain.  No CVA pain.  Can't remember if the pain started before the flight.    Allergies: 1)  ! Sulfa  Review of Systems       See HPI.  Otherwise negative.    Physical Exam  General:  NAD MMM RRR clear to auscultation bilaterally  abdominal exam soft and not tender to palpation  no cva pain no midline back pain reports tenderness in horizontal band below the waistline SLR neg, no pain with int/ext rotation of hips distally nv intact. lower back pain better with knee to chest stretching.    Impression & Recommendations:  Problem # 1:  LOW BACK PAIN, ACUTE (ICD-724.2) I would take tylenol, use heat as needed and use knee to chest stretch.  Likely benign muscle strain, possibly related to plane flight, moving luggage, etc. follow up as needed.  she agrees.  No indication that this is UTI.  Her updated medication list for this problem includes:    Aspirin 81 Mg Tabs (Aspirin) .Marland Kitchen... Take 2 by mouth once daily  Complete Medication List: 1)  Fexofenadine Hcl 180 Mg Tabs (Fexofenadine hcl) .... Take 1 tablet by mouth once a day 2)  Amlodipine Besy-benazepril Hcl 5-10 Mg Caps (Amlodipine besy-benazepril hcl) .... Take 1 tablet by mouth once a day 3)  Lovaza 1 Gm Caps (Omega-3-acid ethyl esters) .... Take 4 capsules by mouth daily 4)  Aspirin 81 Mg Tabs  (Aspirin) .... Take 2 by mouth once daily 5)  Multivitamins Tabs (Multiple vitamin) .... Take 1 tablet by mouth once a day 6)  Calcium Carbonate-vitamin D 600-400 Mg-unit Tabs (Calcium carbonate-vitamin d) .... Take 2 by mouth once daily 7)  Glucosamine-chondroitin Caps (Glucosamine-chondroit-vit c-mn) .... Take 2 by mouth once daily 8)  Simvastatin 40 Mg Tabs (Simvastatin) .... Take 1 tablet by mouth once a day 9)  Lecithin 1200 Mg Caps (Lecithin) .... Takes 2 tabs daily 10)  Omeprazole 40 Mg Cpdr (Omeprazole) .Marland Kitchen.. 1 tab by mouth daily 11)  Sertraline Hcl 50 Mg Tabs (Sertraline hcl) .Marland Kitchen.. 1 tab by mouth at bedtime 12)  Paroxetine Hcl 10 Mg Tabs (Paroxetine hcl) .... Take 1 tablet by mouth once a day  Patient Instructions: 1)  I would take tylenol as needed, use heat as needed and use the knee to chest stretch.  You ikely have a muscle strain, possibly related to plane flight, moving luggage, etc.  Let us know if you aren't getting better.  Take care.     Orders Added: 1)  Est. Patient Level III [16109]    Current Allergies (reviewed today): ! SULFA  Laboratory Results   Urine Tests  Date/Time Received: April 20, 2010 8:31 AM   Routine Urinalysis   Color: yellow Appearance: Clear Glucose: negative   (Normal Range: Negative) Bilirubin: negative   (Normal Range: Negative) Ketone: negative   (Normal Range: Negative) Spec. Gravity: 1.025   (Normal Range: 1.003-1.035) Blood: negative   (Normal Range: Negative) pH: 6.0   (Normal Range: 5.0-8.0) Protein: trace   (Normal Range: Negative) Urobilinogen: 0.2   (Normal Range: 0-1) Nitrite: negative   (Normal Range: Negative) Leukocyte Esterace: negative   (Normal Range: Negative)

## 2010-07-06 NOTE — Progress Notes (Signed)
Summary: Need refill for Lovaza  Phone Note Call from Patient   Caller: Patient Call For: Kerby Nora MD Reason for Call: Acute Illness Summary of Call: Need refill a Lovaza 1 gm caps-takes 4 tablets daily... Called into [facility] # [phone].Marland KitchenDaine Gip  August 10, 2009 10:18 AM  Initial call taken by: Daine Gip,  August 10, 2009 10:18 AM    Prescriptions: LOVAZA 1 GM CAPS (OMEGA-3-ACID ETHYL ESTERS) Take 4 capsules by mouth daily  #360 x 3   Entered by:   Benny Lennert CMA (AAMA)   Authorized by:   Kerby Nora MD   Signed by:   Benny Lennert CMA (AAMA) on 08/10/2009   Method used:   Electronically to        [facility] [phone]* (retail)       [address]       [place], [place]  [address]       Ph: [phone]       Fax: [phone]   RxID:   [id number]

## 2010-07-06 NOTE — Progress Notes (Signed)
Summary: husband is asking for phone call  Phone Note Call from Patient Call back at [phone]   Caller: Spouse Call For: Kerby Nora MD Summary of Call: Pt's husband is asking that you call him regarding pt's visit this morning.  He is asking if her memory problems were discussed. Initial call taken by: Lowella Petties CMA,  August 21, 2009 10:45 AM  Follow-up for Phone Call        Please call husband.. we focused on her chest pain..you can send him todays office visit note for him to review what was done. She has follow up in 1 week...recommend he come to appts with her. If he has furthre questions, let me know and I can call him.  Follow-up by: Kerby Nora MD,  August 21, 2009 11:15 AM  Additional Follow-up for Phone Call Additional follow up Details #1::        Patients husband would like for you to metioned the memory loss because last time he came with her she stayed mad at him for over a week and didnt talk to him. Husband would like to talk to you at some point but, it is not urgent. Additional Follow-up by: Benny Lennert CMA Duncan Dull),  August 21, 2009 11:32 AM    Additional Follow-up for Phone Call Additional follow up Details #2::    Spent 30 min discussing patient care with husband.  Wil repeat MMSE next OV with pt and refer acl to Dr. Quentin Mulling her neurologist for further dementia eval. Initial labs here are negative.  Follow-up by: Kerby Nora MD,  August 21, 2009 5:06 PM

## 2010-07-06 NOTE — Assessment & Plan Note (Signed)
Summary: follow up appt memory/hmw   Vital Signs:  Patient profile:   77 year old female Height:      63 inches Weight:      181.4 pounds BMI:     32.25 Temp:     98.1 degrees F oral Pulse rate:   80 / minute Pulse rhythm:   regular BP sitting:   120 / 78  (left arm) Cuff size:   large  Vitals Entered By: Benny Lennert CMA Duncan Dull) (November 16, 2009 8:13 AM)  History of Present Illness: Chief complaint follow up memory  Dementia, early onset: Continued difficult remembering things. Per pt today has seen neurologist sometime this year...per pt he stated her memory was okay. Lab eval negative.   Depression: Restarted sertraline: Some improvement with mood. Still lack of motivation. Only feels sad when by herself during the day. No insomnia, no anhedonia.   GERD on omeprazole. Doing well ..no heartburn or chest pain.   High cholesterol: At goal on simvastain 40. LFTs nml. Reviewed las with pt.  Watching what she eats..4 lb weight loss.   Walking every few days for exercise.     Problems Prior to Update: 1)  Chest Pain, Atypical  (ICD-786.59) 2)  Breast Pain, Bilateral  (ICD-611.71) 3)  Memory Loss  (ICD-780.93) 4)  Anxiety Depression  (ICD-300.4) 5)  Other Screening Mammogram  (ICD-V76.12) 6)  Low Back Pain, Acute  (ICD-724.2) 7)  Hypertension  (ICD-401.9) 8)  Hyperlipidemia  (ICD-272.4) 9)  Allergic Rhinitis  (ICD-477.9) 10)  Obstructive Sleep Apnea  (ICD-327.23) 11)  Common Migraine  (ICD-346.10)  Current Medications (verified): 1)  Fexofenadine Hcl 180 Mg Tabs (Fexofenadine Hcl) .... Take 1 Tablet By Mouth Once A Day 2)  Amlodipine Besy-Benazepril Hcl 5-10 Mg Caps (Amlodipine Besy-Benazepril Hcl) .... Take 1 Tablet By Mouth Once A Day 3)  Lovaza 1 Gm Caps (Omega-3-Acid Ethyl Esters) .... Take 4 Capsules By Mouth Daily 4)  Aspirin 81 Mg  Tabs (Aspirin) .... Take 2 By Mouth Once Daily 5)  Multivitamins   Tabs (Multiple Vitamin) .... Take 1 Tablet By Mouth Once A Day 6)   Calcium Carbonate-Vitamin D 600-400 Mg-Unit  Tabs (Calcium Carbonate-Vitamin D) .... Take 2 By Mouth Once Daily 7)  Glucosamine-Chondroitin   Caps (Glucosamine-Chondroit-Vit C-Mn) .... Take 2 By Mouth Once Daily 8)  Simvastatin 40 Mg Tabs (Simvastatin) .... Take 1 Tablet By Mouth Once A Day 9)  Lecithin 1200 Mg Caps (Lecithin) .... Takes 2 Tabs Daily 10)  Omeprazole 40 Mg Cpdr (Omeprazole) .Marland Kitchen.. 1 Tab By Mouth Daily 11)  Sertraline Hcl 50 Mg Tabs (Sertraline Hcl) .Marland Kitchen.. 1 Tab By Mouth At Bedtime  Allergies: 1)  ! Sulfa  Past History:  Past medical, surgical, family and social histories (including risk factors) reviewed, and no changes noted (except as noted below).  Past Medical History: Reviewed history from 04/17/2009 and no changes required.  MEMORY LOSS (ICD-780.93) ANXIETY DEPRESSION (ICD-300.4) LOW BACK PAIN, ACUTE (ICD-724.2) HYPERTENSION (ICD-401.9) HYPERLIPIDEMIA (ICD-272.4) ALLERGIC RHINITIS (ICD-477.9) OBSTRUCTIVE SLEEP APNEA (ICD-327.23) COMMON MIGRAINE (ICD-346.10)    Past Surgical History: Reviewed history from 04/17/2009 and no changes required. sleep study 03/2008 partial hysterectomy, for fibroids Appendectomy neck surgery   Family History: Reviewed history from 03/26/2008 and no changes required. father: MI age 53s, alcoholic mother: lymphoma brother: no contact MGM: CAD MGF: sudden death  Social History: Reviewed history from 04/17/2009 and no changes required. Retired Married 26 years, husband 21 years younger pt has children. pt is a former smoker.  started smoking around age 47.  approx 2 ppd.  smoked x 8 years.  Alcohol use-no Drug use-no Regular exercise-no Diet: working on healthy eating, eats a lot of Slim Fast  Physical Exam  General:  pleasnt overweight feamle iN NAD Mouth:  MMM Neck:  no carotid bruit or thyromegaly no cervical or supraclavicular lymphadenopathy  Lungs:  Normal respiratory effort, chest expands symmetrically. Lungs  are clear to auscultation, no crackles or wheezes. Heart:  Normal rate and regular rhythm. S1 and S2 normal without gallop, murmur, click, rub or other extra sounds. Abdomen:  epigastric ttp, soft, normal bowel sounds, no distention, no masses, no guarding, no rebound tenderness, no hepatomegaly, and no splenomegaly.   Pulses:  R and L posterior tibial pulses are full and equal bilaterally  Extremities:  no edema Neurologic:  No cranial nerve deficits noted. Station and gait are normal. Plantar reflexes are down-going bilaterally. DTRs are symmetrical throughout. Sensory, motor and coordinative functions appear intact. oriented to self and place NOT TIME   Impression & Recommendations:  Problem # 1:  MEMORY LOSS (ICD-780.93) Stable. MMSE 29/20, but significant difficult per husband as well as noted during office visit.  Pt repeats questions, repeated conversation, repeated questions about meds, past confucion about meds to significant extent. Lab eval for reversible causes negative. We have not done MRi brain. per pt she may have seen neuro in 2011..no records. Will obtain. if not recent will send back for reeval or to different neurologist.   Problem # 2:  ANXIETY DEPRESSION (ICD-300.4) Well controlled. Continue current medication.   Problem # 3:  HYPERTENSION (ICD-401.9) Well controlled. Continue current medication.  Her updated medication list for this problem includes:    Amlodipine Besy-benazepril Hcl 5-10 Mg Caps (Amlodipine besy-benazepril hcl) .Marland Kitchen... Take 1 tablet by mouth once a day  BP today: 120/78 Prior BP: 120/80 (08/28/2009)  Prior 10 Yr Risk Heart Disease: Not enough information (09/26/2008)  Labs Reviewed: K+: 3.9 (04/08/2009) Creat: : 0.9 (04/08/2009)   Chol: 186 (10/19/2009)   HDL: 43.70 (10/19/2009)   LDL: 219 (05/04/2009)   TG: 220.0 (10/19/2009)  Problem # 4:  HYPERLIPIDEMIA (ICD-272.4) Excellent improvement..continue current meds.  Her updated medication list for  this problem includes:    Lovaza 1 Gm Caps (Omega-3-acid ethyl esters) .Marland Kitchen... Take 4 capsules by mouth daily    Simvastatin 40 Mg Tabs (Simvastatin) .Marland Kitchen... Take 1 tablet by mouth once a day  Problem # 5:  OBSTRUCTIVE SLEEP APNEA (ICD-327.23) Tolerating CPAP at night, doing well.   Problem # 6:  GERD (ICD-530.81) Chest pain resolved with treatment...continue omeprazole and diet changes.  Her updated medication list for this problem includes:    Omeprazole 40 Mg Cpdr (Omeprazole) .Marland Kitchen... 1 tab by mouth daily  Complete Medication List: 1)  Fexofenadine Hcl 180 Mg Tabs (Fexofenadine hcl) .... Take 1 tablet by mouth once a day 2)  Amlodipine Besy-benazepril Hcl 5-10 Mg Caps (Amlodipine besy-benazepril hcl) .... Take 1 tablet by mouth once a day 3)  Lovaza 1 Gm Caps (Omega-3-acid ethyl esters) .... Take 4 capsules by mouth daily 4)  Aspirin 81 Mg Tabs (Aspirin) .... Take 2 by mouth once daily 5)  Multivitamins Tabs (Multiple vitamin) .... Take 1 tablet by mouth once a day 6)  Calcium Carbonate-vitamin D 600-400 Mg-unit Tabs (Calcium carbonate-vitamin d) .... Take 2 by mouth once daily 7)  Glucosamine-chondroitin Caps (Glucosamine-chondroit-vit c-mn) .... Take 2 by mouth once daily 8)  Simvastatin 40 Mg Tabs (Simvastatin) .... Take 1 tablet  by mouth once a day 9)  Lecithin 1200 Mg Caps (Lecithin) .... Takes 2 tabs daily 10)  Omeprazole 40 Mg Cpdr (Omeprazole) .Marland Kitchen.. 1 tab by mouth daily 11)  Sertraline Hcl 50 Mg Tabs (Sertraline hcl) .Marland Kitchen.. 1 tab by mouth at bedtime  Patient Instructions: 1)  Cholesterol much improved. 2)   Continue increased exercise, diet changes and weight loss.   3)  Continue current cholesterol medicine: simvastain and lovaza. 4)  Recheck fasting LIPIDS, CMET  in 6 months Dx 272.0    5)  Please schedule a follow-up appointment in 3 months  30 min.  6)  We will call you once Dr. Delena Bali recent notes are reviewed or for other neurology referral.   Current Allergies (reviewed  today): ! SULFA

## 2010-07-06 NOTE — Assessment & Plan Note (Signed)
Summary: rov for osa   Copy to:  Kerby Nora Primary Provider/Referring Provider:  Kerby Nora MD  CC:  Pt is here for a f/u appt.  Pt states she is using her cpap machine every night.  Approx 6 hours per night.  Pt denied any complaints with mask.  Pt wonders if pressure may need to be adjusted because pt c/o waking up every morning with a headache.  .  History of Present Illness: the pt comes in today for f/u of her osa.  She has been wearing cpap everynight, and is having no mask or pressure issues.  She does note breakthru snoring, and is having am h/a.  She denies pulling mask too tight.  She has not seen a huge difference in how she sleeps or feels, but I have reminded her that she is not at her optimal pressure.  At least she has been able to wear the device consistently.  Medications Prior to Update: 1)  Fexofenadine Hcl 180 Mg Tabs (Fexofenadine Hcl) .... Take 1 Tablet By Mouth Once A Day 2)  Amlodipine Besy-Benazepril Hcl 5-10 Mg Caps (Amlodipine Besy-Benazepril Hcl) .... Take 1 Tablet By Mouth Once A Day 3)  Lovaza 1 Gm Caps (Omega-3-Acid Ethyl Esters) .... Take 4 Capsules By Mouth Daily 4)  Aspirin 81 Mg  Tabs (Aspirin) .... Take 2 By Mouth Once Daily 5)  Multivitamins   Tabs (Multiple Vitamin) .... Take 1 Tablet By Mouth Once A Day 6)  Calcium Carbonate-Vitamin D 600-400 Mg-Unit  Tabs (Calcium Carbonate-Vitamin D) .... Take 2 By Mouth Once Daily 7)  Glucosamine-Chondroitin   Caps (Glucosamine-Chondroit-Vit C-Mn) .... Take 2 By Mouth Once Daily 8)  Zocor 40 Mg Tabs (Simvastatin) .... Take 1 Tab At Bedtime  Allergies (verified): 1)  ! Sulfa  Review of Systems      See HPI  Vital Signs:  Patient profile:   77 year old female Height:      63 inches Weight:      187.25 pounds BMI:     33.29 O2 Sat:      96 % on Room air Temp:     98.0 degrees F oral Pulse rate:   87 / minute BP sitting:   130 / 78  (left arm) Cuff size:   large  Vitals Entered By: Arman Filter LPN  (July 13, 2009 1:45 PM)  O2 Flow:  Room air CC: Pt is here for a f/u appt.  Pt states she is using her cpap machine every night.  Approx 6 hours per night.  Pt denied any complaints with mask.  Pt wonders if pressure may need to be adjusted because pt c/o waking up every morning with a headache.   Comments Medications reviewed with patient Arman Filter LPN  July 13, 2009 1:45 PM    Physical Exam  General:  ow female in nad Nose:  no skin breakdown or pressure necrosis from cpap mask Neurologic:  alert, not sleepy, moves all 4.   Impression & Recommendations:  Problem # 1:  OBSTRUCTIVE SLEEP APNEA (ICD-327.23) the pt is at least wearing cpap this time around, and is having no mask tolerance issues.  I suspect her current symptoms are due to subtherapeutic pressure, but I have explained to her that she needed a desensitization period.  Will go ahead and optimize her pressure on auto mode, and I have encouraged her to work hard on weight loss.  Time spent with pt today was .  Other  Orders: Est. Patient Level III (62130) DME Referral (DME)  Patient Instructions: 1)  will optimize your pressure for you on automatic mode for 2 weeks 2)  Will get a download off machine and let you know your pressure 3)  work on weight loss 4)  If things are going well, will see you in 6mos.  Please call if having issues with tolerance.

## 2010-07-06 NOTE — Progress Notes (Signed)
Summary: nos appt  Phone Note Call from Patient   Caller: [email or web address]  Call For: Brandy [name] Summary of Call: Rsc nos from 8/8 to 8/11 @ 11a. Initial call taken by: Darletta Moll,  January 12, 2010 3:32 PM

## 2010-07-08 NOTE — Assessment & Plan Note (Signed)
Summary: HEADACHE/CLE   Vital Signs:  Patient profile:   77 year old female Height:      63 inches Weight:      181.75 pounds BMI:     32.31 Temp:     98.4 degrees F oral Pulse rate:   80 / minute Pulse rhythm:   regular BP sitting:   120 / 72  (left arm) Cuff size:   large  Vitals Entered By: Benny Lennert CMA Duncan Dull) (June 29, 2010 9:12 AM)  History of Present Illness: Chief complaint headaches  Has history of migraine.Marland KitchenMarland KitchenHad improved significantly. In past few months..  back and more frequently.  One severe migraine in last week.. given imitrex..  headache resolved completely with 2 tabs. Pain in right frontal...nausea. No vomiting with last headache. Sensitive to light and sound. No numbness, no weakness.  No new medications.   Poor historian.. spoke with husband in detail. Migraine have returned... occuring several times a week. He is also having migraines return. She has used several imitrex given migraine returns after it resolved... per husband the nasal spray works the best for her. Husband reports she is having unusual dreams...progressive memory loss.   Dementia, early onset: Continued difficult remembering things. Per pt today has seen neurologist sometime this year...per pt he stated her memory was okay. Lab eval negative. See last few notes.  Today she is unable to tell me who that is...continues to have decline in memory.   Depression: Restarted sertraline: Some improvement with mood. Still lack of motivation. Only feels sad when by herself during the day. No insomnia, no anhedonia.   On med list today.. say she is on both paroxetine and sertraline.Marland Kitchen after pt's med  bag review: she is only taking sertraline.   Problems Prior to Update: 1)  Gerd  (ICD-530.81) 2)  Memory Loss  (ICD-780.93) 3)  Anxiety Depression  (ICD-300.4) 4)  Other Screening Mammogram  (ICD-V76.12) 5)  Low Back Pain, Acute  (ICD-724.2) 6)  Hypertension  (ICD-401.9) 7)  Hyperlipidemia   (ICD-272.4) 8)  Allergic Rhinitis  (ICD-477.9) 9)  Obstructive Sleep Apnea  (ICD-327.23) 10)  Common Migraine  (ICD-346.10)  Current Medications (verified): 1)  Fexofenadine Hcl 180 Mg Tabs (Fexofenadine Hcl) .... Take 1 Tablet By Mouth Once A Day 2)  Amlodipine Besy-Benazepril Hcl 5-10 Mg Caps (Amlodipine Besy-Benazepril Hcl) .... Take 1 Tablet By Mouth Once A Day 3)  Lovaza 1 Gm Caps (Omega-3-Acid Ethyl Esters) .... Take 4 Capsules By Mouth Daily 4)  Aspirin 81 Mg  Tabs (Aspirin) .... Take 2 By Mouth Once Daily 5)  Multivitamins   Tabs (Multiple Vitamin) .... Take 1 Tablet By Mouth Once A Day 6)  Calcium Carbonate-Vitamin D 600-400 Mg-Unit  Tabs (Calcium Carbonate-Vitamin D) .... Take 2 By Mouth Once Daily 7)  Glucosamine-Chondroitin   Caps (Glucosamine-Chondroit-Vit C-Mn) .... Take 2 By Mouth Once Daily 8)  Simvastatin 40 Mg Tabs (Simvastatin) .... Take 1 Tablet By Mouth Once A Day 9)  Lecithin 1200 Mg Caps (Lecithin) .... Takes 2 Tabs Daily 10)  Omeprazole 40 Mg Cpdr (Omeprazole) .Marland Kitchen.. 1 Tab By Mouth Daily 11)  Sertraline Hcl 50 Mg Tabs (Sertraline Hcl) .Marland Kitchen.. 1 Tab By Mouth At Bedtime 12)  Paroxetine Hcl 10 Mg Tabs (Paroxetine Hcl) .... Take 1 Tablet By Mouth Once A Day 13)  Sumatriptan Succinate 100 Mg Tabs (Sumatriptan Succinate) .Marland Kitchen.. 1 Tab By Mouth X 1 , May Repeat in 2 Hours If No Relief. Max Is 200 Mg Daily  Allergies: 1)  !  Sulfa  Past History:  Past medical, surgical, family and social histories (including risk factors) reviewed, and no changes noted (except as noted below).  Past Medical History: Reviewed history from 04/17/2009 and no changes required.  MEMORY LOSS (ICD-780.93) ANXIETY DEPRESSION (ICD-300.4) LOW BACK PAIN, ACUTE (ICD-724.2) HYPERTENSION (ICD-401.9) HYPERLIPIDEMIA (ICD-272.4) ALLERGIC RHINITIS (ICD-477.9) OBSTRUCTIVE SLEEP APNEA (ICD-327.23) COMMON MIGRAINE (ICD-346.10)    Past Surgical History: Reviewed history from 04/17/2009 and no changes  required. sleep study 03/2008 partial hysterectomy, for fibroids Appendectomy neck surgery   Family History: Reviewed history from 03/26/2008 and no changes required. father: MI age 31s, alcoholic mother: lymphoma brother: no contact MGM: CAD MGF: sudden death  Social History: Reviewed history from 04/17/2009 and no changes required. Retired Married 26 years, husband 21 years younger pt has children. pt is a former smoker.  started smoking around age 65.  approx 2 ppd.  smoked x 8 years.  Alcohol use-no Drug use-no Regular exercise-no Diet: working on healthy eating, eats a lot of Slim Fast  Review of Systems General:  Denies fatigue and fever. CV:  Denies chest pain or discomfort. Resp:  Denies shortness of breath. GI:  Denies abdominal pain. GU:  Denies dysuria.  Physical Exam  General:  overweight female inNAD  Head:  Normocephalic and atraumatic without obvious abnormalities. No apparent alopecia or balding. Eyes:  No corneal or conjunctival inflammation noted. EOMI. Perrla. Funduscopic exam benign, without hemorrhages, exudates or papilledema. Vision grossly normal. Ears:  External ear exam shows no significant lesions or deformities.  Otoscopic examination reveals clear canals, tympanic membranes are intact bilaterally without bulging, retraction, inflammation or discharge. Hearing is grossly normal bilaterally. Nose:  External nasal examination shows no deformity or inflammation. Nasal mucosa are pink and moist without lesions or exudates. Mouth:  Oral mucosa and oropharynx without lesions or exudates.  Teeth in good repair. Neck:  no carotid bruit or thyromegaly no cervical or supraclavicular lymphadenopathy  Lungs:  Normal respiratory effort, chest expands symmetrically. Lungs are clear to auscultation, no crackles or wheezes. Heart:  Normal rate and regular rhythm. S1 and S2 normal without gallop, murmur, click, rub or other extra sounds. Pulses:  R and L  posterior tibial pulses are full and equal bilaterally  Extremities:  no edema Neurologic:  No cranial nerve deficits noted. Station and gait are normal. Plantar reflexes are down-going bilaterally. DTRs are symmetrical throughout. Sensory, motor and coordinative functions appear intact. oriented to self and place NOT TIME Psych:  Cognition and judgment appear intact. Alert and cooperative with normal attention span and concentration. No apparent delusions, illusions, hallucinations   Impression & Recommendations:  Problem # 1:  COMMON MIGRAINE (ICD-346.10)  Will send her home with a migraine diary and trigger info.  Reviewed with pt husband.  Given return of headaches as weell as progression of memory loss... will eval with MRI brain. nml neuro exam otherwise.  Her updated medication list for this problem includes:    Aspirin 81 Mg Tabs (Aspirin) .Marland Kitchen... Take 2 by mouth once daily    Sumatriptan Succinate 100 Mg Tabs (Sumatriptan succinate) .Marland Kitchen... 1 tab by mouth x 1 , may repeat in 2 hours if no relief. max is 200 mg daily  Problem # 2:  MEMORY LOSS (ICD-780.93)  Pt continues to have memory issue. Per pt she has seen Dr. Quentin Mulling and told no memory issues.Marland Kitchen this is not the case per our last MMSE and per husband.   Back on CPAP no help with memory. Will check MRi  brain.  Refer to a different neurologist for reeval.   Orders: Radiology Referral (Radiology)  Problem # 3:  ANXIETY DEPRESSION (ICD-300.4) Moderate control on sertraline.  Complete Medication List: 1)  Fexofenadine Hcl 180 Mg Tabs (Fexofenadine hcl) .... Take 1 tablet by mouth once a day 2)  Amlodipine Besy-benazepril Hcl 5-10 Mg Caps (Amlodipine besy-benazepril hcl) .... Take 1 tablet by mouth once a day 3)  Lovaza 1 Gm Caps (Omega-3-acid ethyl esters) .... Take 4 capsules by mouth daily 4)  Aspirin 81 Mg Tabs (Aspirin) .... Take 2 by mouth once daily 5)  Multivitamins Tabs (Multiple vitamin) .... Take 1 tablet by mouth  once a day 6)  Calcium Carbonate-vitamin D 600-400 Mg-unit Tabs (Calcium carbonate-vitamin d) .... Take 2 by mouth once daily 7)  Glucosamine-chondroitin Caps (Glucosamine-chondroit-vit c-mn) .... Take 2 by mouth once daily 8)  Simvastatin 40 Mg Tabs (Simvastatin) .... Take 1 tablet by mouth once a day 9)  Lecithin 1200 Mg Caps (Lecithin) .... Takes 2 tabs daily 10)  Omeprazole 40 Mg Cpdr (Omeprazole) .Marland Kitchen.. 1 tab by mouth daily 11)  Sertraline Hcl 50 Mg Tabs (Sertraline hcl) .Marland Kitchen.. 1 tab by mouth at bedtime 12)  Sumatriptan Succinate 100 Mg Tabs (Sumatriptan succinate) .Marland Kitchen.. 1 tab by mouth x 1 , may repeat in 2 hours if no relief. max is 200 mg daily  Other Orders: Neurology Referral (Neuro)  Patient Instructions: 1)  Keep headache diary. 2)  Look for triggers. 3)  Get back on track with exercise 3-5 times a week. 4)   Referral Appointment Information 5)  Day/Date: 6)  Time: 7)  Place/MD: 8)  Address: 9)  Phone/Fax: 10)  Patient given appointment information. Information/Orders faxed/mailed.  11)  Please schedule a follow-up appointment in 1 month appt.    Orders Added: 1)  Neurology Referral [Neuro] 2)  Radiology Referral [Radiology] 3)  Est. Patient Level IV [[address]]    Current Allergies (reviewed today): ! SULFA

## 2010-07-08 NOTE — Progress Notes (Signed)
Summary: requests imitrex  Phone Note Call from Patient   Caller: Spouse Summary of Call: Pt's husband states pt has had a headache for a few days and is asking if imitrex can be called in.  I advised him that since pt has not been treated here for headaches that she would need to be seen first.   Pt states she has a migraine and doesnt feel like coming in for appt.  Please advise.                Lowella Petties CMA, AAMA  June 24, 2010 12:40 PM   Follow-up for Phone Call        It does not look like we have ever written for imitrex or any triptan at all. I think headache evaluation is most appropriate before first triptan script in an 77 year old. Follow-up by: Hannah Beat MD,  June 24, 2010 1:08 PM  Additional Follow-up for Phone Call Additional follow up Details #1::        Patient husband advised. He said that patient has not held her head up and has not eat in 2 day or even been out bed Additional Follow-up by: Benny Lennert CMA Duncan Dull),  June 24, 2010 2:42 PM    Additional Follow-up for Phone Call Additional follow up Details #2::    Again, I urge medical attention and evaluation as noted before.  Follow-up by: Hannah Beat MD,  June 24, 2010 2:59 PM  Additional Follow-up for Phone Call Additional follow up Details #3:: Details for Additional Follow-up Action Taken: I let husband know and he says patient will not go to the hospital or come here Additional Follow-up by: Benny Lennert CMA Duncan Dull),  June 24, 2010 3:02 PM   Appended Document: requests imitrex After reviewing prior MD notes patient was on maxalt for migraine headaches will forword to dr. Ermalene Searing for furthur recommendations  Appended Document: Orders Update If she has been on maxalt in the past and feels she has typical migraine symptoms... go ahead and send in imitrex as below.  Make sure verify that symptoms are typical for her migraines...if not  she needs to be seen. Kerby Nora MD   June 25, 2010 8:22 AM   Clinical Lists Changes  Medications: Added new medication of SUMATRIPTAN SUCCINATE 100 MG TABS (SUMATRIPTAN SUCCINATE) 1 tab by mouth x 1 , may repeat in 2 hours if no relief. MAx is 200 mg daily - Signed Rx of SUMATRIPTAN SUCCINATE 100 MG TABS (SUMATRIPTAN SUCCINATE) 1 tab by mouth x 1 , may repeat in 2 hours if no relief. MAx is 200 mg daily;  #9 x 0;  Signed;  Entered by: Kerby Nora MD;  Authorized by: Kerby Nora MD;  Method used: [facility] [phone]*, [address], [place], [place], [place]  [address], Ph: [phone], Fax: [phone]    Prescriptions: SUMATRIPTAN SUCCINATE 100 MG TABS (SUMATRIPTAN SUCCINATE) 1 tab by mouth x 1 , may repeat in 2 hours if no relief. MAx is 200 mg daily  #9 x 0   Entered and Authorized by:   Kerby Nora MD   Signed by:   Kerby Nora MD on 06/25/2010   Method used:   [facility]  [address] [phone][facility] (retail)       [address] [facility]       [place], [place]  [address]       Ph: [phone]       Fax: [phone]   RxID:   [id number]   Patient is having same symptoms as before when she had a migraine head ache.Consuello Masse CMA

## 2010-07-14 ENCOUNTER — Telehealth (INDEPENDENT_AMBULATORY_CARE_PROVIDER_SITE_OTHER): Payer: Self-pay | Admitting: *Deleted

## 2010-07-21 ENCOUNTER — Encounter: Payer: Self-pay | Admitting: Pulmonary Disease

## 2010-07-22 NOTE — Progress Notes (Signed)
Summary: needs something for headache pain  Phone Note Call from Patient   Caller: Spouse Summary of Call: Pt's husband called, pt is having severe headaches every day and is out of imitrex.  She doesnt have a neuro appt until 4/11.   Husband says she cant wait that long, she is losing a lot of memory.  Needs something for the headache pain, [facility] [address].  Please call and let pt know. Initial call taken by: Lowella Petties CMA, AAMA,  July 14, 2010 12:22 PM  Follow-up for Phone Call        9 Imitrex were filled on 07/12/2010.  All out now...that is quick use of all script.  may need recheck if doing that poorly. Hannah Beat MD  July 14, 2010 6:14 PM   Additional Follow-up for Phone Call Additional follow up Details #1::        Spoke with patient and advised her medication sent to pharmacy on 07-12-2010 Additional Follow-up by: Benny Lennert CMA (AAMA),  July 14, 2010 6:42 PM

## 2010-07-30 ENCOUNTER — Ambulatory Visit: Payer: Self-pay | Admitting: Family Medicine

## 2010-07-30 ENCOUNTER — Ambulatory Visit
Admission: RE | Admit: 2010-07-30 | Discharge: 2010-07-30 | Disposition: A | Discharge status: Home / Self Care | Payer: Medicare Other | Source: Ambulatory Visit | Attending: Family Medicine | Admitting: Family Medicine

## 2010-07-30 DIAGNOSIS — Z1239 Encounter for other screening for malignant neoplasm of breast: Secondary | ICD-10-CM

## 2010-08-02 ENCOUNTER — Encounter: Payer: Self-pay | Admitting: Family Medicine

## 2010-08-02 ENCOUNTER — Ambulatory Visit (INDEPENDENT_AMBULATORY_CARE_PROVIDER_SITE_OTHER): Payer: Medicare Other | Admitting: Family Medicine

## 2010-08-02 DIAGNOSIS — G43009 Migraine without aura, not intractable, without status migrainosus: Secondary | ICD-10-CM

## 2010-08-02 DIAGNOSIS — F341 Dysthymic disorder: Secondary | ICD-10-CM

## 2010-08-02 DIAGNOSIS — R413 Other amnesia: Secondary | ICD-10-CM

## 2010-08-03 NOTE — Letter (Signed)
Summary: CMN for PAP/[facility]  CMN for PAP/[facility]   Imported By: Sherian Rein 07/28/2010 12:00:33  _____________________________________________________________________  External Attachment:    Type:   Image     Comment:   External Document

## 2010-08-12 NOTE — Assessment & Plan Note (Signed)
Summary: 1 MONTH F/U 30 MIN JRT   Vital Signs:  Patient profile:   77 year old female Height:      63 inches Weight:      179.75 pounds BMI:     31.96 Temp:     98.9 degrees F oral Pulse rate:   80 / minute Pulse rhythm:   regular BP sitting:   120 / 70  (left arm) Cuff size:   large  Vitals Entered By: Benny Lennert CMA Duncan Dull) (August 02, 2010 2:46 PM)  History of Present Illness: Chief complaint 1 month follow up   Migraine..Headache occuring daily. Over eyes, frontal head. No nausea, sensitive to light. No vision change Using excedrin.Marland Kitchen 4-6 a day. Wakes up in AM with bad headaches. Using CPAP for sleep apnea. tried keeping migraine diary for 1 week.. no specific triggers.   Dementia: MRI brain  showed nonspecific moderate small vessel changes, no acute infarct. She has pending appt with neurology on 4/11 with Dr. Vickey Huger.   Depression: On sertraline for mood.. feeling well in this area.  HTN, well controlled on current med.  Problems Prior to Update: 1)  Headache  (ICD-784.0) 2)  Gerd  (ICD-530.81) 3)  Memory Loss  (ICD-780.93) 4)  Anxiety Depression  (ICD-300.4) 5)  Other Screening Mammogram  (ICD-V76.12) 6)  Low Back Pain, Acute  (ICD-724.2) 7)  Hypertension  (ICD-401.9) 8)  Hyperlipidemia  (ICD-272.4) 9)  Allergic Rhinitis  (ICD-477.9) 10)  Obstructive Sleep Apnea  (ICD-327.23) 11)  Common Migraine  (ICD-346.10)  Current Medications (verified): 1)  Fexofenadine Hcl 180 Mg Tabs (Fexofenadine Hcl) .... Take 1 Tablet By Mouth Once A Day 2)  Amlodipine Besy-Benazepril Hcl 5-10 Mg Caps (Amlodipine Besy-Benazepril Hcl) .... Take 1 Tablet By Mouth Once A Day 3)  Lovaza 1 Gm Caps (Omega-3-Acid Ethyl Esters) .... Take 4 Capsules By Mouth Daily 4)  Aspirin 81 Mg  Tabs (Aspirin) .... Take 2 By Mouth Once Daily 5)  Multivitamins   Tabs (Multiple Vitamin) .... Take 1 Tablet By Mouth Once A Day 6)  Calcium Carbonate-Vitamin D 600-400 Mg-Unit  Tabs (Calcium  Carbonate-Vitamin D) .... Take 2 By Mouth Once Daily 7)  Glucosamine-Chondroitin   Caps (Glucosamine-Chondroit-Vit C-Mn) .... Take 2 By Mouth Once Daily 8)  Simvastatin 40 Mg Tabs (Simvastatin) .... Take 1 Tablet By Mouth Once A Day 9)  Lecithin 1200 Mg Caps (Lecithin) .... Takes 2 Tabs Daily 10)  Omeprazole 40 Mg Cpdr (Omeprazole) .Marland Kitchen.. 1 Tab By Mouth Daily 11)  Sertraline Hcl 50 Mg Tabs (Sertraline Hcl) .Marland Kitchen.. 1 Tab By Mouth At Bedtime 12)  Sumatriptan Succinate 100 Mg Tabs (Sumatriptan Succinate) .Marland Kitchen.. 1 Tab By Mouth X 1 , May Repeat in 2 Hours If No Relief. Max Is 200 Mg Daily  Allergies: 1)  ! Sulfa  Past History:  Past medical, surgical, family and social histories (including risk factors) reviewed, and no changes noted (except as noted below).  Past Medical History: Reviewed history from 04/17/2009 and no changes required.  MEMORY LOSS (ICD-780.93) ANXIETY DEPRESSION (ICD-300.4) LOW BACK PAIN, ACUTE (ICD-724.2) HYPERTENSION (ICD-401.9) HYPERLIPIDEMIA (ICD-272.4) ALLERGIC RHINITIS (ICD-477.9) OBSTRUCTIVE SLEEP APNEA (ICD-327.23) COMMON MIGRAINE (ICD-346.10)    Past Surgical History: Reviewed history from 04/17/2009 and no changes required. sleep study 03/2008 partial hysterectomy, for fibroids Appendectomy neck surgery   Family History: Reviewed history from 03/26/2008 and no changes required. father: MI age 22s, alcoholic mother: lymphoma brother: no contact MGM: CAD MGF: sudden death  Social History: Reviewed history from 04/17/2009 and  no changes required. Retired Married 26 years, husband 21 years younger pt has children. pt is a former smoker.  started smoking around age 24.  approx 2 ppd.  smoked x 8 years.  Alcohol use-no Drug use-no Regular exercise-no Diet: working on healthy eating, eats a lot of Slim Fast  Review of Systems General:  Denies fatigue and fever. CV:  Denies chest pain or discomfort. Resp:  Denies shortness of breath. GI:  Denies  abdominal pain. Neuro:  Denies falling down, headaches, numbness, seizures, tingling, and tremors.  Physical Exam  General:  overweight female inNAD  Ears:  External ear exam shows no significant lesions or deformities.  Otoscopic examination reveals clear canals, tympanic membranes are intact bilaterally without bulging, retraction, inflammation or discharge. Hearing is grossly normal bilaterally. Nose:  External nasal examination shows no deformity or inflammation. Nasal mucosa are pink and moist without lesions or exudates. Mouth:  Oral mucosa and oropharynx without lesions or exudates.  Teeth in good repair. Neck:  no carotid bruit or thyromegaly no cervical or supraclavicular lymphadenopathy  Lungs:  Normal respiratory effort, chest expands symmetrically. Lungs are clear to auscultation, no crackles or wheezes. Heart:  Normal rate and regular rhythm. S1 and S2 normal without gallop, murmur, click, rub or other extra sounds. Pulses:  R and L posterior tibial pulses are full and equal bilaterally  Extremities:  no edema Neurologic:  No cranial nerve deficits noted. Station and gait are normal. Plantar reflexes are down-going bilaterally. DTRs are symmetrical throughout. Sensory, motor and coordinative functions appear intact. oriented to self and place NOT TIME   Impression & Recommendations:  Problem # 1:  COMMON MIGRAINE (ICD-346.10) MRI to eval this and #2.. showed no specific changes. Migrianes returned recently for unclear reason.  No clear triggers.  Definately elemnet of meidcation overuse headache. Start migraine preventative.. topiramate 25 mg daily.. increase as tolerated.  Her updated medication list for this problem includes:    Aspirin 81 Mg Tabs (Aspirin) .Marland Kitchen... Take 2 by mouth once daily    Sumatriptan Succinate 100 Mg Tabs (Sumatriptan succinate) .Marland Kitchen... 1 tab by mouth x 1 , may repeat in 2 hours if no relief. max is 200 mg daily  Orders: Prescription Created  Electronically [phone])  Problem # 2:  MEMORY LOSS (ICD-780.93) Significant.. interfering with daily functioning.  No clear SE to med....continued in past even when off sertraline. Refer to neuro... for further eval. and diagnosis.  Problem # 3:  ANXIETY DEPRESSION (ICD-300.4) Well controlled on sertraline daily.   Problem # 4:  HYPERTENSION (ICD-401.9) Well controlled. Continue current medication.  Her updated medication list for this problem includes:    Amlodipine Besy-benazepril Hcl 5-10 Mg Caps (Amlodipine besy-benazepril hcl) .Marland Kitchen... Take 1 tablet by mouth once a day  Complete Medication List: 1)  Fexofenadine Hcl 180 Mg Tabs (Fexofenadine hcl) .... Take 1 tablet by mouth once a day 2)  Amlodipine Besy-benazepril Hcl 5-10 Mg Caps (Amlodipine besy-benazepril hcl) .... Take 1 tablet by mouth once a day 3)  Lovaza 1 Gm Caps (Omega-3-acid ethyl esters) .... Take 4 capsules by mouth daily 4)  Aspirin 81 Mg Tabs (Aspirin) .... Take 2 by mouth once daily 5)  Multivitamins Tabs (Multiple vitamin) .... Take 1 tablet by mouth once a day 6)  Calcium Carbonate-vitamin D 600-400 Mg-unit Tabs (Calcium carbonate-vitamin d) .... Take 2 by mouth once daily 7)  Glucosamine-chondroitin Caps (Glucosamine-chondroit-vit c-mn) .... Take 2 by mouth once daily 8)  Simvastatin 40 Mg Tabs (Simvastatin) .Marland KitchenMarland KitchenMarland Kitchen  Take 1 tablet by mouth once a day 9)  Lecithin 1200 Mg Caps (Lecithin) .... Takes 2 tabs daily 10)  Omeprazole 40 Mg Cpdr (Omeprazole) .Marland Kitchen.. 1 tab by mouth daily 11)  Sertraline Hcl 50 Mg Tabs (Sertraline hcl) .Marland Kitchen.. 1 tab by mouth at bedtime 12)  Sumatriptan Succinate 100 Mg Tabs (Sumatriptan succinate) .Marland Kitchen.. 1 tab by mouth x 1 , may repeat in 2 hours if no relief. max is 200 mg daily 13)  Topamax 25 Mg Tabs (Topiramate) 14)  Topiramate 25 Mg Tabs (Topiramate) .Marland Kitchen.. 1 tab by mouth daily at bedtime, if no side effects can increase to 2 tabs by mouth at bedtime  Patient Instructions: 1)  Start topirimate at 25  mg at bedtime for migraine perevention... take every day to prevent migraines. 2)   If no side effects but no improvement increasr to 2 tabs at bedtime. 3)   Stop all over the counter pain meds such and excedrine. 4)   Use imitrex only for severe migraine. 5)  Keep appt 4/11 with neurologist for memory eval and discussion of migraine. 6)  Increase exercsie, increase water, eat regular meals.  7)  Please schedule a follow-up appointment in 1 month migraine. Prescriptions: TOPIRAMATE 25 MG TABS (TOPIRAMATE) 1 tab by mouth daily at bedtime, if no side effects can increase to 2 tabs by mouth at bedtime  #60 x 2   Entered and Authorized by:   Kerby Nora MD   Signed by:   Kerby Nora MD on 08/02/2010   Method used:   [facility]  [address] [phone]* (retail)       [address]       [place], [place]  [address]       Ph: [phone]       Fax: [phone]   RxID:   [id number]    Orders Added: 1)  Prescription Created Electronically [G8553] 2)  Est. Patient Level IV [[address]]    Current Allergies (reviewed today): ! SULFA

## 2010-10-01 ENCOUNTER — Other Ambulatory Visit: Payer: Self-pay | Admitting: *Deleted

## 2010-10-01 MED ORDER — OMEGA-3-ACID ETHYL ESTERS 1 G PO CAPS: ORAL_CAPSULE | ORAL | Status: DC

## 2010-10-22 NOTE — Procedures (Signed)
[facility]  Patient:    Brandy [name], Brandy [name] Visit Number: [id number] MRN: [id number]          Service Type: END Location: ENDO Attending Physician:  Sabino Gasser Proc. Date: 02/15/01 Admit Date:  02/15/2001                             Procedure Report  PROCEDURE:  Colonoscopy.  INDICATIONS:  Rectal bleeding.  ANESTHESIA:  Fentanyl 50 mcg, Versed 4 mg.  DESCRIPTION OF PROCEDURE:  With the patient mildly sedated in the left lateral decubitus position, the Olympus videoscopic variable-stiffness colonoscope was inserted in the rectum and passed under direct vision to the cecum, identified by the ileocecal valve and appendiceal orifice, both of which were photographed.  From this point, the colonoscope was slowly withdrawn, taking circumferential views of the entire colonic mucosa, stopping at approximately 20-30 cm from the anal verge at which point first a small polyp was seen and removed using hot biopsy forceps technique.  A larger polyp on a stalk was seen and photographed and removed using snare cautery technique setting of 20-20 blended current.  All of the tissue was obtained and sent for pathology. There was good hemostasis.  The scope was withdrawn.  The patients vital signs and pulse oximeter remained stable.  The patient tolerated the procedure well without apparent complications.  FINDINGS:  Polyp at 20-30 cm, and internal hemorrhoids, otherwise unremarkable examination.  PLAN:  Await biopsy report.  The patient will call me for results and follow up with me as an outpatient. Attending Physician:  Sabino Gasser DD:  02/15/01 TD:  02/15/01 Job: [id number]

## 2010-10-22 NOTE — Procedures (Signed)
[facility]  Patient:    Brandy [name], Brandy [name] Visit Number: [id number] MRN: [id number]          Service Type: END Location: ENDO Attending Physician:  Sabino Gasser Dictated by:   Sabino Gasser, M.D. Proc. Date: 02/15/01 Admit Date:  02/15/2001                             Procedure Report  PROCEDURE PERFORMED:  Upper endoscopy with biopsy.  ENDOSCOPIST:  Sabino Gasser, M.D.  INDICATIONS FOR PROCEDURE:  Abdominal pain.  ANESTHESIA:  Fentanyl 75 mcg, Versed 8 mg.  DESCRIPTION OF PROCEDURE:  With the patient mildly sedated in the left lateral decubitus position, the Olympus video endoscope was inserted in the mouth and passed under direct vision through the esophagus where there was a question of a hiatal hernia.  We entered into the stomach.  The fundus, body, antrum, duodenal bulb and second portion of the duodenum were all well-visualized. From this point, the endoscope was slowly withdrawn taking circumferential views of the entire duodenal mucosa until the endoscope had been pulled back into the stomach and placed on retroflexion to view the stomach from below. The endoscope was then straightened and withdrawn taking circumferential views of the entire gastric and esophageal mucosa, stopping in the stomach which appeared slightly reddened and was biopsied to rule out gastritis.  Patients vital signs and pulse oximeter remained stable.  The patient tolerated the procedure well without apparent complications.  FINDINGS:  Changes of gastritis biopsied.  Await biopsy report.  Patient will call me for results and follow up with me as an outpatient.  Proceed to colonoscopy as planned.  PLAN: Dictated by:   Sabino Gasser, M.D. Attending Physician:  Sabino Gasser DD:  02/15/01 TD:  02/15/01 Job: [id number]

## 2010-10-22 NOTE — Op Note (Signed)
Brandy [name], Brandy [name]               ACCOUNT NO.:  [id number]   MEDICAL RECORD NO.:  [id number]          PATIENT TYPE:  AMB   LOCATION:  ENDO                         FACILITY:  [facility]   PHYSICIAN:  Georgiana Spinner, M.D.    DATE OF BIRTH:  Jul 05, 1929   DATE OF PROCEDURE:  10/28/2004  DATE OF DISCHARGE:                                 OPERATIVE REPORT   PROCEDURE:  Colonoscopy.   INDICATIONS:  Colon polyps.   ANESTHESIA:  Fentanyl 100 mcg, Versed 10 mg.   PROCEDURE:  The patient mildly sedated in the left lateral decubitus  position, the Olympus videoscopic colonoscope was inserted in the rectum and  passed under direct vision to the cecum identified by ileocecal valve,  appendiceal orifice both which were photographed.  From this point the  colonoscope was slowly withdrawn taking circumferential views of colonic  mucosa stopping in the rectum which appeared normal on direct and showed  hemorrhoids on retroflexed view.  The endoscope was straightened and  withdrawn. The patient's vital signs, pulse oximeter remained stable. The  patient tolerated procedure well without apparent complications.   FINDINGS:  Internal hemorrhoids, some diverticulosis of sigmoid colon.  Otherwise an unremarkable colonoscopic examination to the cecum.   PLAN:  Repeat examination possibly in 5 years      GMO/MEDQ  D:  10/28/2004  T:  10/28/2004  Job:  [id number]   cc:   Dr. Synetta Fail

## 2010-10-28 ENCOUNTER — Telehealth: Payer: Self-pay | Admitting: *Deleted

## 2010-10-28 NOTE — Telephone Encounter (Signed)
Patients husband thinks that medication is to strong or we don't have the right diagnosis. Patient Passed memory test at [facility] neurology. They wanted her to see psy. Patient declined. Patient husband said that patient is very ill some times and some times she is good as gold. Husband states that patient is not going to come in for appt unless we call her and schedule it

## 2010-10-29 NOTE — Telephone Encounter (Signed)
I reviewed neuro's note. I do think we need to have both her  AND her husband in to discuss concerns at the sametime to get to the bottom of what is going on.  Have her make an appt, but make it clear I NEED her husband or daughter to be present as well.

## 2010-11-03 NOTE — Telephone Encounter (Signed)
Spoke to patient and told her you would like her to come in for appt. She said her time is tied up right know b/c her son is in [facility]. I told her that i would give her a couple of weeks and call her back.

## 2010-11-15 ENCOUNTER — Telehealth: Payer: Self-pay | Admitting: Family Medicine

## 2010-11-15 NOTE — Telephone Encounter (Signed)
Message copied by Excell Seltzer on Mon Nov 15, 2010  5:23 PM ------      Message from: Carin Primrose F      Created: Mon Nov 15, 2010  3:17 PM      Regarding: Change of PCP      Contact: [phone]       Dr. Ermalene Searing:            Mrs. Dereka Lueras would like to change her PCP doctor to Dr. Sharen Hones.  Please advise as to your wishes.            Bonita Quin

## 2010-11-15 NOTE — Telephone Encounter (Signed)
Okay to change. This patient has been having memory issues, poor historian. Neuro referral stated they did not feel she had memory issues!  She does have depression which remians moderately controlled... ? If this is contributing to memory issues.

## 2010-12-18 ENCOUNTER — Other Ambulatory Visit: Payer: Self-pay | Admitting: Family Medicine

## 2011-01-02 ENCOUNTER — Other Ambulatory Visit: Payer: Self-pay | Admitting: Family Medicine

## 2011-01-02 DIAGNOSIS — R413 Other amnesia: Secondary | ICD-10-CM

## 2011-01-02 DIAGNOSIS — I10 Essential (primary) hypertension: Secondary | ICD-10-CM

## 2011-01-02 DIAGNOSIS — E785 Hyperlipidemia, unspecified: Secondary | ICD-10-CM

## 2011-01-05 ENCOUNTER — Other Ambulatory Visit (INDEPENDENT_AMBULATORY_CARE_PROVIDER_SITE_OTHER): Payer: Medicare Other | Admitting: Family Medicine

## 2011-01-05 DIAGNOSIS — I10 Essential (primary) hypertension: Secondary | ICD-10-CM

## 2011-01-05 DIAGNOSIS — R413 Other amnesia: Secondary | ICD-10-CM

## 2011-01-05 DIAGNOSIS — E785 Hyperlipidemia, unspecified: Secondary | ICD-10-CM

## 2011-01-05 LAB — LIPID PANEL
Total CHOL/HDL Ratio: 5
Triglycerides: 113 mg/dL (ref 0.0–149.0)

## 2011-01-05 LAB — CBC WITH DIFFERENTIAL/PLATELET
Eosinophils Absolute: 0.2 10*3/uL (ref 0.0–0.7)
Hemoglobin: 13.1 g/dL (ref 12.0–15.0)
Monocytes Absolute: 0.6 10*3/uL (ref 0.1–1.0)
Monocytes Relative: 7.7 % (ref 3.0–12.0)
Neutro Abs: 4.9 10*3/uL (ref 1.4–7.7)
Neutrophils Relative %: 62.3 % (ref 43.0–77.0)
RDW: 13.5 % (ref 11.5–14.6)
WBC: 7.9 10*3/uL (ref 4.5–10.5)

## 2011-01-06 LAB — COMPREHENSIVE METABOLIC PANEL
AST: 19 U/L (ref 0–37)
Albumin: 3.7 g/dL (ref 3.5–5.2)
BUN: 19 mg/dL (ref 6–23)
CO2: 25 mEq/L (ref 19–32)
Calcium: 9.2 mg/dL (ref 8.4–10.5)
GFR: 55.58 mL/min — ABNORMAL LOW (ref >60.00)
Potassium: 3.6 mEq/L (ref 3.5–5.1)
Sodium: 141 mEq/L (ref 135–145)

## 2011-01-06 LAB — LDL CHOLESTEROL, DIRECT: Direct LDL: 137.2 mg/dL

## 2011-01-07 ENCOUNTER — Encounter: Payer: Self-pay | Admitting: Family Medicine

## 2011-01-10 ENCOUNTER — Ambulatory Visit (INDEPENDENT_AMBULATORY_CARE_PROVIDER_SITE_OTHER): Payer: Medicare Other | Admitting: Family Medicine

## 2011-01-10 ENCOUNTER — Encounter: Payer: Self-pay | Admitting: Family Medicine

## 2011-01-10 VITALS — BP 130/70 | HR 80 | Temp 98.5°F | Wt 173.8 lb

## 2011-01-10 DIAGNOSIS — R413 Other amnesia: Secondary | ICD-10-CM

## 2011-01-10 DIAGNOSIS — F341 Dysthymic disorder: Secondary | ICD-10-CM

## 2011-01-10 DIAGNOSIS — E785 Hyperlipidemia, unspecified: Secondary | ICD-10-CM

## 2011-01-10 DIAGNOSIS — G4733 Obstructive sleep apnea (adult) (pediatric): Secondary | ICD-10-CM

## 2011-01-10 DIAGNOSIS — I10 Essential (primary) hypertension: Secondary | ICD-10-CM

## 2011-01-10 MED ORDER — SERTRALINE HCL 100 MG PO TABS: 100.0000 mg | ORAL_TABLET | Freq: Every day | ORAL | Status: DC

## 2011-01-10 NOTE — Progress Notes (Signed)
Subjective:    Patient ID: Brandy [name], female    DOB: [birth year], 77 y.o.   MRN: [id number]  HPI CC: CPE/transfer care  Transfer from Dr. Ermalene Searing presents for CPE and to establish care.  However did not receive nor fill out annual medicare wellness forms.  Memory problems/depression - states "my memory is horrible".  Has had w/u by neurology wnl per records, pt states cannot remember.  Was supposed to see psychiatrist, states this was rescheduled for sometime this month.  has had several episodes of depression.  Endorses worsening recently.  Takes mind off things or goes for walk to help cope with issues but not enough.  On topamax per neurology for HAs.  Takes excedrin as well for HAs.  Has run out of imitrex.  MRI 06/2010 - no acute changes, just age-related changes.  Neuro did not think dementia going on.  Attributed memory problems to family stressors.  HLD - on simvastatin 40mg  qhs as well as started on lovaza, wants to see how levels are.  Denies h/o HTN.  Preventative: Colonoscopy Result Date: 06/07/2003 Colonoscopy Result: normal Colonoscopy Next Due: 10 yr PAP Next Due: Not Indicated Bone Density Result Date: 06/07/1999  Bone Density Result: normal  Bone Density Next Due: 2 yr  Pneumonia shot - UTD.  Last tetanus 2004.     Would like to be set up for dexa. Mammogram 07/2010 normal  Medications and allergies reviewed and updated in chart. Patient Active Problem List  Diagnoses  . HYPERLIPIDEMIA  . ANXIETY DEPRESSION  . OBSTRUCTIVE SLEEP APNEA  . COMMON MIGRAINE  . HYPERTENSION  . ALLERGIC RHINITIS  . GERD  . LOW BACK PAIN, ACUTE  . MEMORY LOSS  . HEADACHE   Past Medical History  Diagnosis Date  . Anxiety   . Depression   . Memory loss   . HTN (hypertension)   . HLD (hyperlipidemia)   . Allergic rhinitis   . OSA (obstructive sleep apnea)     CPAP at night  . Migraine    Past Surgical History  Procedure Date  . Partial hysterectomy     for fibroids  .  Appendectomy   . Neck surgery    History  Substance Use Topics  . Smoking status: Former Smoker -- 2.0 packs/day for 8 years    Types: Cigarettes    Quit date: 06/06/1969  . Smokeless tobacco: Never Used  . Alcohol Use: No   Family History  Problem Relation Age of Onset  . Heart attack Father     in 25's  . Alcohol abuse Father   . Lymphoma Mother   . Coronary artery disease Maternal Grandmother   . Sudden death Maternal Grandfather    Allergies  Allergen Reactions  . Sulfonamide Derivatives     REACTION: Can't remember   Current Outpatient Prescriptions on File Prior to Visit  Medication Sig Dispense Refill  . amLODipine-benazepril (LOTREL) 5-10 MG per capsule Take 1 capsule by mouth daily.        Marland Kitchen aspirin 81 MG tablet Take 162 mg by mouth daily.       . Calcium Carbonate-Vitamin D (CALCIUM 600+D) 600-400 MG-UNIT per tablet Take 2 tablets by mouth daily.        . Glucosamine-Chondroitin (GLUCOSAMINE CHONDR COMPLEX PO) Take 2 capsules by mouth daily.        . Lecithin 1200 MG CAPS Take 2 capsules by mouth daily.        . Multiple Vitamin (MULTIVITAMIN) tablet  Take 1 tablet by mouth daily.        Marland Kitchen omega-3 acid ethyl esters (LOVAZA) 1 G capsule Take 4 caps by mouth daily  360 capsule  2  . omeprazole (PRILOSEC) 40 MG capsule TAKE ONE CAPSULE EVERY DAY  30 capsule  9  . simvastatin (ZOCOR) 40 MG tablet Take 40 mg by mouth at bedtime.        . fexofenadine (ALLEGRA) 180 MG tablet Take 180 mg by mouth daily.        . SUMAtriptan (IMITREX) 100 MG tablet Take 100 mg by mouth every 2 (two) hours as needed.         Review of Systems  Constitutional: Negative for fever, chills, activity change, appetite change, fatigue and unexpected weight change.  HENT: Negative for hearing loss and neck pain.   Eyes: Negative for visual disturbance.  Respiratory: Negative for cough, chest tightness, shortness of breath and wheezing.   Cardiovascular: Negative for chest pain, palpitations and  leg swelling.  Gastrointestinal: Negative for nausea, vomiting, abdominal pain, diarrhea, constipation, blood in stool and abdominal distention.  Genitourinary: Negative for hematuria and difficulty urinating.  Musculoskeletal: Negative for myalgias and arthralgias.  Skin: Negative for rash.  Neurological: Positive for headaches. Negative for dizziness, seizures and syncope.  Hematological: Does not bruise/bleed easily.  Psychiatric/Behavioral: Positive for dysphoric mood. The patient is not nervous/anxious.        Objective:   Physical Exam  Nursing note and vitals reviewed. Constitutional: She is oriented to person, place, and time. She appears well-developed and well-nourished. No distress.  HENT:  Head: Normocephalic and atraumatic.  Right Ear: External ear normal.  Left Ear: External ear normal.  Nose: Nose normal.  Mouth/Throat: Oropharynx is clear and moist. No oropharyngeal exudate.  Eyes: Conjunctivae and EOM are normal. Pupils are equal, round, and reactive to light. No scleral icterus.  Neck: Normal range of motion. Neck supple.  Cardiovascular: Normal rate, regular rhythm, normal heart sounds and intact distal pulses.   No murmur heard. Pulses:      Radial pulses are 2+ on the right side, and 2+ on the left side.  Pulmonary/Chest: Effort normal and breath sounds normal. No respiratory distress. She has no wheezes. She has no rales. Right breast exhibits no inverted nipple, no mass, no nipple discharge, no skin change and no tenderness. Left breast exhibits no inverted nipple, no mass, no nipple discharge, no skin change and no tenderness. Breasts are symmetrical.  Abdominal: Soft. Bowel sounds are normal. She exhibits no distension and no mass. There is no tenderness. There is no rebound and no guarding.  Musculoskeletal: Normal range of motion. She exhibits no edema.  Lymphadenopathy:    She has no cervical adenopathy.  Neurological: She is alert and oriented to person,  place, and time.       CN grossly intact, station and gait intact  Skin: Skin is warm and dry. No rash noted.  Psychiatric: She has a normal mood and affect. Her behavior is normal. Judgment and thought content normal.          Assessment & Plan:

## 2011-01-10 NOTE — Patient Instructions (Addendum)
Increase sertraline to 100mg  daily.  You may start 2 pills daily until new prescription which will be for 100mg  tablets (then just take one) Work on getting back into gym. Check with insurance company about where they prefer you get shingles shot - here or at pharmacy. Cholesterol levels are looking better!  Keep up good work, watch diet - more nuts, beans, legumes, soy. Good to meet you today, call us with questions. Return in 1 month for follow up.

## 2011-01-10 NOTE — Assessment & Plan Note (Signed)
Endorses taking lotrel.  bp good control today, continue.

## 2011-01-10 NOTE — Assessment & Plan Note (Addendum)
Endorsing continued depressive sxs.  Interested in increasing SSRI. PHQ-9 today 8. GAD 7 not completed. Increase Zoloft to 100mg  daily, return 1 month for f/u.

## 2011-01-10 NOTE — Assessment & Plan Note (Addendum)
Reviewed blood work. Continue lovaza and simvastatin 40mg  (also on amlodipine, may need to decrease doses in future or switch statin).

## 2011-01-10 NOTE — Assessment & Plan Note (Addendum)
Await eval by psych, per pt later this month. Topamax started only recently, doubt affecting memory as this is a longterm complaint.

## 2011-01-10 NOTE — Assessment & Plan Note (Signed)
Endorses compliance with CPAP nightly.

## 2011-02-02 ENCOUNTER — Ambulatory Visit: Payer: Self-pay | Admitting: Pulmonary Disease

## 2011-02-10 ENCOUNTER — Encounter: Payer: Self-pay | Admitting: Family Medicine

## 2011-02-10 ENCOUNTER — Ambulatory Visit (INDEPENDENT_AMBULATORY_CARE_PROVIDER_SITE_OTHER): Payer: Medicare Other | Admitting: Family Medicine

## 2011-02-10 DIAGNOSIS — R413 Other amnesia: Secondary | ICD-10-CM

## 2011-02-10 DIAGNOSIS — F341 Dysthymic disorder: Secondary | ICD-10-CM

## 2011-02-10 DIAGNOSIS — I10 Essential (primary) hypertension: Secondary | ICD-10-CM

## 2011-02-10 DIAGNOSIS — R51 Headache: Secondary | ICD-10-CM

## 2011-02-10 NOTE — Assessment & Plan Note (Signed)
?   Bipolar dx at recent psych eval. No records available. Will have pt call me with what med was started.

## 2011-02-10 NOTE — Assessment & Plan Note (Signed)
Good control.  continue meds.

## 2011-02-10 NOTE — Assessment & Plan Note (Signed)
Improved with new medicine by psych, pt doesn't know which it is. Will ask her to call us with new med names.

## 2011-02-10 NOTE — Patient Instructions (Addendum)
Call us with the name of the new medicines you're on by psychiatry. Good to see you today. Return to see me in 3 months for follow up.

## 2011-02-10 NOTE — Assessment & Plan Note (Signed)
Doesn't think increased sertraline.  Will hold things for now while I get name of new meds.  Advised to continue taking meds as has been.

## 2011-02-10 NOTE — Progress Notes (Signed)
  Subjective:    Patient ID: Brandy [name], female    DOB: 16-Nov-1929, 77 y.o.   MRN: [id number]  HPI CC: 1 mo f/u  Previous visit increased sertraline to 100mg , unsure if actually increased sertraline dose or if taking 50mg .  "biggest problem I have is forgetfulness".    Saw psychiatrist last month, started on some meds but forgot to bring today.  Takes 3 new pills which have helped headaches.  Husband says helping her mood as well.  Unsure of next appointment.  Thinks he may have said bipolar.  HTN - BP doing well, tolerating lotrel.  No vision changes, CP/tightness, SOB, leg swelling.  Trying to lose weight - eating healthy, leaving off dessert.  Goal weight is 135lb. Wt Readings from Last 3 Encounters:  02/10/11 169 lb 12 oz (76.998 kg)  01/10/11 173 lb 12 oz (78.812 kg)  08/02/10 179 lb 12 oz (81.534 kg)   Review of Systems Per HPI    Objective:   Physical Exam  Nursing note and vitals reviewed. Constitutional: She appears well-developed and well-nourished. No distress.  HENT:  Head: Normocephalic and atraumatic.  Mouth/Throat: Oropharynx is clear and moist. No oropharyngeal exudate.  Eyes: Conjunctivae and EOM are normal. Pupils are equal, round, and reactive to light. No scleral icterus.  Neck: Normal range of motion. Neck supple. Carotid bruit is not present.  Cardiovascular: Normal rate, regular rhythm, normal heart sounds and intact distal pulses.   No murmur heard. Pulmonary/Chest: Effort normal and breath sounds normal. No respiratory distress. She has no wheezes. She has no rales.  Lymphadenopathy:    She has no cervical adenopathy.  Skin: Skin is warm and dry. No rash noted.  Psychiatric: She has a normal mood and affect.       forgetful          Assessment & Plan:

## 2011-03-17 ENCOUNTER — Encounter: Payer: Self-pay | Admitting: Family Medicine

## 2011-03-17 ENCOUNTER — Ambulatory Visit (INDEPENDENT_AMBULATORY_CARE_PROVIDER_SITE_OTHER): Payer: Medicare Other | Admitting: Family Medicine

## 2011-03-17 VITALS — BP 124/72 | HR 84 | Temp 99.1°F | Wt 170.8 lb

## 2011-03-17 DIAGNOSIS — R350 Frequency of micturition: Secondary | ICD-10-CM

## 2011-03-17 DIAGNOSIS — IMO0002 Reserved for concepts with insufficient information to code with codable children: Secondary | ICD-10-CM

## 2011-03-17 LAB — POCT URINALYSIS DIPSTICK
Glucose, UA: NEGATIVE
Ketones, UA: NEGATIVE
Leukocytes, UA: NEGATIVE
Protein, UA: NEGATIVE
Spec Grav, UA: 1.015
Urobilinogen, UA: 1

## 2011-03-17 NOTE — Assessment & Plan Note (Signed)
No evidence of infection currently. Anticipate vaginal atrophy leading to burning, dyspareunia and soreness. rec trial of vaginal lubricant or moisturizer. If not improving, advised pt return for further eval, pelvic, etc.

## 2011-03-17 NOTE — Progress Notes (Signed)
  Subjective:    Patient ID: Brandy [name], female    DOB: Jul 18, 1929, 77 y.o.   MRN: [id number]  HPI CC: "I think I have infection"  2-3d history of symptoms.  Yesterday and day before had burning with urination as well as soreness vaginal area.  None today.  Mild back pain.  Some discomfort with sexual relations with husband, thinks due to dryness.  No urgency or frequency, abd pain, fevers/chills, nausea/vomiting.  No new rashes, no burning or itching on outside.  No vaginal discharge.  No change in color of urine.  No sense of smell.  Review of Systems Per HPI    Objective:   Physical Exam  Nursing note and vitals reviewed. Constitutional: She appears well-developed and well-nourished. No distress.  HENT:  Head: Normocephalic and atraumatic.  Mouth/Throat: Oropharynx is clear and moist. No oropharyngeal exudate.  Eyes: Conjunctivae and EOM are normal. Pupils are equal, round, and reactive to light. No scleral icterus.  Neck: Normal range of motion. Neck supple.  Cardiovascular: Normal rate, regular rhythm, normal heart sounds and intact distal pulses.   No murmur heard. Pulmonary/Chest: Effort normal and breath sounds normal. No respiratory distress. She has no wheezes. She has no rales.  Abdominal: Soft. Bowel sounds are normal. She exhibits no distension. There is no hepatosplenomegaly. There is no tenderness. There is no rigidity, no rebound, no guarding, no CVA tenderness and negative Murphy's sign.  Genitourinary:       deferred  Skin: Skin is warm and dry. No rash noted.  Psychiatric: She has a normal mood and affect.      Assessment & Plan:

## 2011-03-17 NOTE — Patient Instructions (Signed)
There doesn't seem to be any infection in the urine. I'd recommend lots of water and cranberry juice for bladder health.   Try vaginal lubricant or moisturizer prior to relations (replens 3 times a week or elegance women's lubricants). If burning returns, let me know. If not improving as expected, please return to be seen. Good to see you today, call us with questions.

## 2011-04-08 ENCOUNTER — Other Ambulatory Visit: Payer: Self-pay | Admitting: Family Medicine

## 2011-04-09 ENCOUNTER — Other Ambulatory Visit: Payer: Self-pay | Admitting: Family Medicine

## 2011-04-14 ENCOUNTER — Other Ambulatory Visit (HOSPITAL_COMMUNITY)
Admission: RE | Admit: 2011-04-14 | Discharge: 2011-04-14 | Disposition: A | Discharge status: Home / Self Care | Payer: Medicare Other | Source: Ambulatory Visit | Attending: Gynecology | Admitting: Gynecology

## 2011-04-14 ENCOUNTER — Encounter: Payer: Self-pay | Admitting: Gynecology

## 2011-04-14 ENCOUNTER — Ambulatory Visit (INDEPENDENT_AMBULATORY_CARE_PROVIDER_SITE_OTHER): Payer: Medicare Other | Admitting: Gynecology

## 2011-04-14 DIAGNOSIS — Z124 Encounter for screening for malignant neoplasm of cervix: Secondary | ICD-10-CM

## 2011-04-14 DIAGNOSIS — N941 Unspecified dyspareunia: Secondary | ICD-10-CM

## 2011-04-14 DIAGNOSIS — Z01419 Encounter for gynecological examination (general) (routine) without abnormal findings: Secondary | ICD-10-CM

## 2011-04-14 DIAGNOSIS — Z1211 Encounter for screening for malignant neoplasm of colon: Secondary | ICD-10-CM

## 2011-04-14 DIAGNOSIS — IMO0002 Reserved for concepts with insufficient information to code with codable children: Secondary | ICD-10-CM

## 2011-04-14 DIAGNOSIS — N951 Menopausal and female climacteric states: Secondary | ICD-10-CM

## 2011-04-14 DIAGNOSIS — N952 Postmenopausal atrophic vaginitis: Secondary | ICD-10-CM

## 2011-04-14 DIAGNOSIS — K921 Melena: Secondary | ICD-10-CM

## 2011-04-14 LAB — POC HEMOCCULT BLD/STL (OFFICE/1-CARD/DIAGNOSTIC): Fecal Occult Blood, POC: POSITIVE

## 2011-04-14 MED ORDER — ESTRADIOL 0.1 MG/GM VA CREA: 2.0000 g | TOPICAL_CREAM | Freq: Every day | VAGINAL | Status: DC

## 2011-04-14 MED ORDER — ESTRADIOL 0.1 MG/GM VA CREA: 4.0000 g | TOPICAL_CREAM | Freq: Every day | VAGINAL | Status: DC

## 2011-04-14 NOTE — Progress Notes (Signed)
Brandy [name] [birth year] [id number]  History:    77 y.o.  who has not been seen the office since 2010. Patient complaining of vaginal dryness and dyspareunia. Her primary physician is Dr. Eustaquio Boyden who is been monitoring patient's hypertension hypercholesterolemia. All her labs were done 3 months ago so no additional lap will be drawn today. Review of her record indicated her colonoscopy was in 2005 was benign. She has a history of total abdominal hysterectomy in the past. Her mammogram was this year was normal. Her records indicated that she is overdue for bone density study the last was in 2007  Past medical history,surgical history, family history and social history were all reviewed and documented in the EPIC chart.  Gynecologic History No LMP recorded. Patient has had a hysterectomy. Contraception: none Last Pap: 2010. Results were:normal} Last mammogram: 2012. Results were:normal}  Obstetric History OB History    Grav Para Term Preterm Abortions TAB SAB Ect Mult Living   4 3 3  1  1   3      # Outc Date GA Lbr Len/2nd Wgt Sex Del Anes PTL Lv   1 TRM     M SVD   Yes   2 TRM     M SVD   Yes   3 TRM     F SVD   Yes   4 SAB                ROS:  Was performed and pertinent positives and negatives are included in the history.  Exam: chaperone present  BP 126/80  Ht 5' 2.5" (1.588 m)  Wt 168 lb (76.204 kg)  BMI 30.24 kg/m2  Body mass index is 30.24 kg/(m^2).  General appearance : Well developed well nourished female. No acute distress HEENT: Neck supple, trachea midline, no carotid bruits, no thyroidmegaly Lungs: Clear to auscultation, no rhonchi or wheezes, or rib retractions  Heart: Regular rate and rhythm, no murmurs or gallops Breast:Examined in sitting and supine position were symmetrical in appearance, no palpable masses or tenderness,  no skin retraction, no nipple inversion, no nipple discharge, no skin discoloration, no axillary or supraclavicular  lymphadenopathy Abdomen: no palpable masses or tenderness, no rebound or guarding Extremities: no edema or skin discoloration or tenderness  Pelvic:  Bartholin, Urethra, Skene Glands: Within normal limits             Vagina: No gross lesions or discharge  Cervix: Absent Uterus absent Adnexa  Without masses or tenderness  Anus and perineum  normal   Rectovaginal  normal sphincter tone without palpated masses or tenderness             Hemoccult obtained results pending at time of this dictation     Assessment/Plan:  77 y.o. female with complaint of vaginal dryness and pruritus. Findings significant for postmenopausal vaginal atrophy. We went to place her on Estrace vaginal cream to apply each bedtime for one week then to apply twice a week thereafter. She was scheduled for bone density study. Pap smear was done today. She was instructed to followup with Dr. Eustaquio Boyden to discuss further of her progressive memory loss to discuss any medications that are currently in the market to see if they can help her as well. She is instructed take her calcium and vitamin D for osteoporosis prevention as well and to continue to maintain an active lifestyle.  Ok Edwards MD, 12:10 PM 04/14/2011

## 2011-04-18 ENCOUNTER — Encounter: Payer: Self-pay | Admitting: Family Medicine

## 2011-04-19 ENCOUNTER — Other Ambulatory Visit: Payer: Self-pay | Admitting: Family Medicine

## 2011-04-21 ENCOUNTER — Ambulatory Visit: Payer: Medicare Other | Admitting: Family Medicine

## 2011-04-22 ENCOUNTER — Other Ambulatory Visit: Payer: Self-pay | Admitting: *Deleted

## 2011-04-22 DIAGNOSIS — K921 Melena: Secondary | ICD-10-CM

## 2011-04-26 ENCOUNTER — Ambulatory Visit: Payer: Medicare Other | Admitting: Family Medicine

## 2011-04-26 ENCOUNTER — Encounter: Payer: Self-pay | Admitting: Family Medicine

## 2011-04-26 ENCOUNTER — Ambulatory Visit (INDEPENDENT_AMBULATORY_CARE_PROVIDER_SITE_OTHER): Payer: Medicare Other | Admitting: Family Medicine

## 2011-04-26 VITALS — BP 150/82 | HR 80 | Temp 97.9°F | Wt 165.8 lb

## 2011-04-26 DIAGNOSIS — M549 Dorsalgia, unspecified: Secondary | ICD-10-CM

## 2011-04-26 DIAGNOSIS — M546 Pain in thoracic spine: Secondary | ICD-10-CM | POA: Insufficient documentation

## 2011-04-26 DIAGNOSIS — R109 Unspecified abdominal pain: Secondary | ICD-10-CM

## 2011-04-26 LAB — POCT URINALYSIS DIPSTICK
Bilirubin, UA: NEGATIVE
Blood, UA: NEGATIVE
Glucose, UA: NEGATIVE
Ketones, UA: 15
Spec Grav, UA: 1.02
Urobilinogen, UA: 0.2

## 2011-04-26 MED ORDER — CYCLOBENZAPRINE HCL 5 MG PO TABS: 5.0000 mg | ORAL_TABLET | Freq: Two times a day (BID) | ORAL | Status: DC | PRN

## 2011-04-26 NOTE — Assessment & Plan Note (Addendum)
UA - negative for infection.  No blood, protein or LE, nitrates. Keep appt with Dr. Elnoria Howard.

## 2011-04-26 NOTE — Assessment & Plan Note (Signed)
Seems very MSK as sore to touch.   Anticipate thoracic paraspinous mm strain and rhomboid strain. Provided with stretching exercises, rec treat with ice/heat as well as flexeril as muscle relaxant. Discussed sedation precautions. Update if not improving as expected.

## 2011-04-26 NOTE — Patient Instructions (Addendum)
It looks like you have appointment tomorrow with Dr. Elnoria Howard at 2:45pm.  Make sure to keep it. For your back - I think you have muscle strain.  Treat with ice/heat for back.  May use flexeril which is a muscle relaxant - short course of this, may make you sleepy. Urine looked negative for infection today. Update [place] if not improving as expected.

## 2011-04-26 NOTE — Progress Notes (Signed)
  Subjective:    Patient ID: Brandy [name], female    DOB: 01/14/1930, 77 y.o.   MRN: [id number]  HPI CC: back pain  3wk h/o back pain worse on left side.  Nagging ache.  Intermittent, one day hurts, next doesn't.  Denies inciting injury  No fevers/chills, dysuria, nausea/vomiting.  No shooting pain down legs, numbness or weakness in legs, urinary or bowel changes/accidents.  bp elevated today - states did not take am bp med 2/2 several meetings this morning.  Last BM was yesterday morning, normal.  Denies blood in stool or urine.  No diarrhea.  No chest pain or tightness.  Taking stool softeners.  Review of Systems Per HPI    Objective:   Physical Exam  Nursing note and vitals reviewed. Constitutional: She appears well-developed and well-nourished. No distress.  HENT:  Head: Normocephalic and atraumatic.  Mouth/Throat: Oropharynx is clear and moist. No oropharyngeal exudate.  Eyes: Conjunctivae and EOM are normal. Pupils are equal, round, and reactive to light. No scleral icterus.  Neck: Normal range of motion. Neck supple.  Cardiovascular: Normal rate, regular rhythm, normal heart sounds and intact distal pulses.   No murmur heard. Pulmonary/Chest: Effort normal and breath sounds normal. No respiratory distress. She has no wheezes. She has no rales.  Abdominal: Soft. Bowel sounds are normal. She exhibits no distension and no mass. There is generalized tenderness (mild). There is no rebound and no guarding.  Musculoskeletal: She exhibits no edema.       Midline mild tenderness mid thoracic as well as left paraspinous/rhomboid tightness, sore to touch. Neg SLR bilaterally. No SIJ or GTB tenderness nor at sciatic notch No CVA tenderness No pain with int/ext rotation at hip.  Neg FABER bilaterally  Lymphadenopathy:    She has no cervical adenopathy.  Neurological: She is alert. She has normal strength. No sensory deficit.  Reflex Scores:      Patellar reflexes are 2+ on the  right side and 2+ on the left side. Skin: Skin is warm and dry. No rash noted.  Psychiatric: She has a normal mood and affect.       Assessment & Plan:

## 2011-06-24 ENCOUNTER — Ambulatory Visit (INDEPENDENT_AMBULATORY_CARE_PROVIDER_SITE_OTHER): Payer: Medicare Other | Admitting: Family Medicine

## 2011-06-24 ENCOUNTER — Encounter: Payer: Self-pay | Admitting: Family Medicine

## 2011-06-24 VITALS — BP 124/78 | HR 84 | Temp 98.2°F | Wt 161.0 lb

## 2011-06-24 DIAGNOSIS — R413 Other amnesia: Secondary | ICD-10-CM

## 2011-06-24 DIAGNOSIS — M6740 Ganglion, unspecified site: Secondary | ICD-10-CM

## 2011-06-24 DIAGNOSIS — R0989 Other specified symptoms and signs involving the circulatory and respiratory systems: Secondary | ICD-10-CM

## 2011-06-24 DIAGNOSIS — M546 Pain in thoracic spine: Secondary | ICD-10-CM

## 2011-06-24 MED ORDER — CYCLOBENZAPRINE HCL 5 MG PO TABS: 5.0000 mg | ORAL_TABLET | Freq: Two times a day (BID) | ORAL | Status: DC | PRN

## 2011-06-24 NOTE — Patient Instructions (Signed)
Return in 2-3 months for follow up.  We will talk more about your memory at your follow up visit.  Watch the driving. I think you have muscle strain of your back - take flexeril (sent in)  You were on this previously but unsure if still on it.  Continue heating pad.  If not improving let me know. For finger - looks like ganglion cyst, don't need to do anything unless starts bothering you. If you're trying, weight loss is good.  Keep up the good work with diet changes. Return for blood work prior to next appointment.

## 2011-06-24 NOTE — Progress Notes (Signed)
  Subjective:    Patient ID: Brandy [name], female    DOB: February 23, 1930, 78 y.o.   MRN: [id number]  HPI CC: multiple concerns  presents alone today.  Continues with memory problems.  Noticed in last year.  Doesn't know what medicines she is taking so unable to do med rec, says "pharmacist calls me when I run out of my medicines".  Husband started paying bills.  Pt continues driving, has not gotten lost.  Wonders if too young for beginnings of dementia.   1 mo h/o L back pain - just under shoulderblade.  Doesn't keep awake at night.  Improved when laying on left side.  Nothing worsens pain.  Has not tried anything for this that she can remember.  Has tried heating pad which helps at night time.  Weight loss - has cut back on carbohydrates.  States goal for her is 135lbs.  Not worried about this weight loss.  No recent change in activity.  Normal colonoscopy 2005, rpt due 10 yrs.  Due for mammogram, last normal early 2012. Wt Readings from Last 3 Encounters:  06/24/11 161 lb (73.029 kg)  04/26/11 165 lb 12 oz (75.184 kg)  04/14/11 168 lb (76.204 kg)   Chest congestion - "i've always had some cough" nonsmoker.  "not a problem".  However family concerned.  Finger nodule - doesn't bother her.  Doesn't want treatment for this.  present for several months to possibly years.  Still driving.  Never gets lost driving.  Doesn't forget to turn off stove.  Review of Systems Per HPI    Objective:   Physical Exam  Nursing note and vitals reviewed. Constitutional: She appears well-developed and well-nourished. No distress.  HENT:  Head: Normocephalic and atraumatic.  Mouth/Throat: Oropharynx is clear and moist. No oropharyngeal exudate.  Eyes: Conjunctivae and EOM are normal. Pupils are equal, round, and reactive to light. No scleral icterus.  Neck: Normal range of motion. Neck supple.  Cardiovascular: Normal rate, regular rhythm, normal heart sounds and intact distal pulses.   No murmur  heard. Pulmonary/Chest: Effort normal and breath sounds normal. No respiratory distress. She has no wheezes. She has no rales.  Musculoskeletal: Normal range of motion. She exhibits no edema.       Arms:      Left back inferior to scapula tender to palpation.  4th finger left hand with multiple connected hard cystic structures dorsal radial PIP joint  Lymphadenopathy:    She has no cervical adenopathy.  Skin: Skin is warm and dry. No rash noted.  Psychiatric: She has a normal mood and affect.   Does not identify president "he's repeated and I can see him in my mind". Oriented to person and mostly place.  "i'm in [place]", [place].  Today is Friday, 13th, February, 2013. Repeats questions and statements.    Assessment & Plan:

## 2011-06-25 ENCOUNTER — Encounter: Payer: Self-pay | Admitting: Family Medicine

## 2011-06-25 DIAGNOSIS — R0989 Other specified symptoms and signs involving the circulatory and respiratory systems: Secondary | ICD-10-CM | POA: Insufficient documentation

## 2011-06-25 DIAGNOSIS — M6740 Ganglion, unspecified site: Secondary | ICD-10-CM | POA: Insufficient documentation

## 2011-06-25 NOTE — Assessment & Plan Note (Signed)
Discussed and reassured. Pt does not want further eval/treatment currently.

## 2011-06-25 NOTE — Assessment & Plan Note (Signed)
Anticipate muscle strain - treat with flexeril, has tolerated well in past. Update [place] if not improving as expected. Continue heating pad.

## 2011-06-25 NOTE — Assessment & Plan Note (Addendum)
Has had several evaluations: will review all records available and addend note. Return for rpt MMSE, memory eval in 2-3 months, asked to bring husband with her.

## 2011-06-25 NOTE — Assessment & Plan Note (Signed)
Lungs clear today, no coughing on exam or during visit. Reassured. Update me if deteriorating.

## 2011-07-06 ENCOUNTER — Other Ambulatory Visit: Payer: Self-pay | Admitting: Family Medicine

## 2011-09-16 ENCOUNTER — Other Ambulatory Visit: Payer: Self-pay | Admitting: Family Medicine

## 2011-09-16 DIAGNOSIS — Z1231 Encounter for screening mammogram for malignant neoplasm of breast: Secondary | ICD-10-CM

## 2011-09-30 ENCOUNTER — Other Ambulatory Visit: Payer: Self-pay | Admitting: Family Medicine

## 2011-09-30 NOTE — Telephone Encounter (Signed)
OK to refill

## 2011-10-03 ENCOUNTER — Ambulatory Visit: Payer: Medicare Other

## 2011-10-25 ENCOUNTER — Ambulatory Visit
Admission: RE | Admit: 2011-10-25 | Discharge: 2011-10-25 | Disposition: A | Discharge status: Home / Self Care | Payer: Medicare Other | Source: Ambulatory Visit | Attending: Family Medicine | Admitting: Family Medicine

## 2011-10-25 DIAGNOSIS — Z1231 Encounter for screening mammogram for malignant neoplasm of breast: Secondary | ICD-10-CM

## 2011-10-26 ENCOUNTER — Other Ambulatory Visit: Payer: Self-pay | Admitting: Family Medicine

## 2011-11-09 ENCOUNTER — Telehealth: Payer: Self-pay

## 2011-11-09 NOTE — Telephone Encounter (Signed)
Do not recommend diazepam given her age and it will not help memory either.  Check with psych to see what they recommend for stress. Also due for f/u visit - never came back for memory eval.  i'd like her to come in for 30 min OV to discuss if desired (with husband), but I want her to check with psych first.

## 2011-11-09 NOTE — Telephone Encounter (Signed)
Pt husband request Diazepam 10 mg. Pt under a lot of stress and memory problems. Pt also seeing Dr Prudencio Burly. [facility].

## 2011-11-10 NOTE — Telephone Encounter (Signed)
Attempted to call patient's husband. Mailbox was full. Unable to leave message. Will try again later.

## 2011-11-11 ENCOUNTER — Encounter: Payer: Self-pay | Admitting: *Deleted

## 2011-11-11 NOTE — Telephone Encounter (Signed)
Spoke with husband about his own appt and that is scheduled with Dr. Para March on 11/14/11.  Mailed a letter to patient concerning the wife and we will also speak to him about the wife's condition at the 11/14/11 appt.

## 2011-11-21 ENCOUNTER — Emergency Department (HOSPITAL_COMMUNITY)
Admission: EM | Admit: 2011-11-21 | Discharge: 2011-11-23 | Disposition: A | Discharge status: Other Acute Inpatient Hospital | Payer: Medicare Other | Attending: Emergency Medicine | Admitting: Emergency Medicine

## 2011-11-21 ENCOUNTER — Encounter (HOSPITAL_COMMUNITY): Payer: Self-pay | Admitting: Emergency Medicine

## 2011-11-21 ENCOUNTER — Emergency Department (HOSPITAL_COMMUNITY): Payer: Medicare Other

## 2011-11-21 DIAGNOSIS — I10 Essential (primary) hypertension: Secondary | ICD-10-CM | POA: Insufficient documentation

## 2011-11-21 DIAGNOSIS — F17200 Nicotine dependence, unspecified, uncomplicated: Secondary | ICD-10-CM | POA: Insufficient documentation

## 2011-11-21 DIAGNOSIS — M545 Low back pain, unspecified: Secondary | ICD-10-CM | POA: Insufficient documentation

## 2011-11-21 DIAGNOSIS — F068 Other specified mental disorders due to known physiological condition: Secondary | ICD-10-CM | POA: Insufficient documentation

## 2011-11-21 DIAGNOSIS — F3289 Other specified depressive episodes: Secondary | ICD-10-CM | POA: Insufficient documentation

## 2011-11-21 DIAGNOSIS — M25519 Pain in unspecified shoulder: Secondary | ICD-10-CM | POA: Insufficient documentation

## 2011-11-21 DIAGNOSIS — D696 Thrombocytopenia, unspecified: Secondary | ICD-10-CM | POA: Insufficient documentation

## 2011-11-21 DIAGNOSIS — Z79899 Other long term (current) drug therapy: Secondary | ICD-10-CM | POA: Insufficient documentation

## 2011-11-21 DIAGNOSIS — F0390 Unspecified dementia without behavioral disturbance: Secondary | ICD-10-CM

## 2011-11-21 DIAGNOSIS — M25512 Pain in left shoulder: Secondary | ICD-10-CM

## 2011-11-21 DIAGNOSIS — E871 Hypo-osmolality and hyponatremia: Secondary | ICD-10-CM | POA: Insufficient documentation

## 2011-11-21 DIAGNOSIS — R0602 Shortness of breath: Secondary | ICD-10-CM | POA: Insufficient documentation

## 2011-11-21 DIAGNOSIS — G4733 Obstructive sleep apnea (adult) (pediatric): Secondary | ICD-10-CM | POA: Insufficient documentation

## 2011-11-21 DIAGNOSIS — R443 Hallucinations, unspecified: Secondary | ICD-10-CM

## 2011-11-21 DIAGNOSIS — E785 Hyperlipidemia, unspecified: Secondary | ICD-10-CM | POA: Insufficient documentation

## 2011-11-21 DIAGNOSIS — F411 Generalized anxiety disorder: Secondary | ICD-10-CM | POA: Insufficient documentation

## 2011-11-21 DIAGNOSIS — F329 Major depressive disorder, single episode, unspecified: Secondary | ICD-10-CM | POA: Insufficient documentation

## 2011-11-21 NOTE — ED Notes (Signed)
PT. REPORTS LEFT SHOULDER/LEFT UPPER ARM PAIN FOR 2 WEEKS , DENIES CHEST PAIN , NO SOB , UNSURE IF SHE INJURED IT.

## 2011-11-22 ENCOUNTER — Emergency Department (HOSPITAL_COMMUNITY): Payer: Medicare Other

## 2011-11-22 LAB — URINALYSIS, ROUTINE W REFLEX MICROSCOPIC
Leukocytes, UA: NEGATIVE
Protein, ur: NEGATIVE mg/dL
Urobilinogen, UA: 1 mg/dL (ref 0.0–1.0)

## 2011-11-22 LAB — DIFFERENTIAL
Basophils Absolute: 0 10*3/uL (ref 0.0–0.1)
Basophils Relative: 0 % (ref 0–1)
Eosinophils Absolute: 0.2 10*3/uL (ref 0.0–0.7)
Eosinophils Relative: 3 % (ref 0–5)
Lymphocytes Relative: 41 % (ref 12–46)

## 2011-11-22 LAB — CBC
MCH: 30.6 pg (ref 26.0–34.0)
MCV: 90.4 fL (ref 78.0–100.0)
Platelets: 122 10*3/uL — ABNORMAL LOW (ref 150–400)
RDW: 13.6 % (ref 11.5–15.5)
WBC: 7.6 10*3/uL (ref 4.0–10.5)

## 2011-11-22 LAB — BASIC METABOLIC PANEL
Calcium: 9.2 mg/dL (ref 8.4–10.5)
GFR calc non Af Amer: 58 mL/min — ABNORMAL LOW (ref >90)
Sodium: 132 mEq/L — ABNORMAL LOW (ref 135–145)

## 2011-11-22 MED ORDER — SERTRALINE HCL 100 MG PO TABS
100.0000 mg | ORAL_TABLET | Freq: Every day | ORAL | Status: DC
  Administered 2011-11-22: 100 mg via ORAL
  Filled 2011-11-22 (×2): qty 1

## 2011-11-22 MED ORDER — TOPIRAMATE 25 MG PO TABS
50.0000 mg | ORAL_TABLET | Freq: Every day | ORAL | Status: DC
  Administered 2011-11-22 – 2011-11-23 (×2): 50 mg via ORAL
  Filled 2011-11-22 (×2): qty 2

## 2011-11-22 MED ORDER — DIVALPROEX SODIUM ER 500 MG PO TB24: 500.0000 mg | ORAL_TABLET | ORAL | Status: DC

## 2011-11-22 MED ORDER — PANTOPRAZOLE SODIUM 40 MG PO TBEC
40.0000 mg | DELAYED_RELEASE_TABLET | Freq: Every day | ORAL | Status: DC
  Administered 2011-11-22 – 2011-11-23 (×2): 40 mg via ORAL
  Filled 2011-11-22 (×2): qty 1

## 2011-11-22 MED ORDER — ACETAMINOPHEN 325 MG PO TABS
650.0000 mg | ORAL_TABLET | Freq: Once | ORAL | Status: AC
  Administered 2011-11-22: 650 mg via ORAL
  Filled 2011-11-22: qty 2

## 2011-11-22 MED ORDER — ZOLPIDEM TARTRATE 5 MG PO TABS
5.0000 mg | ORAL_TABLET | Freq: Every evening | ORAL | Status: DC | PRN
  Administered 2011-11-22: 5 mg via ORAL
  Filled 2011-11-22: qty 1

## 2011-11-22 NOTE — ED Notes (Signed)
Gave Sitter a break 

## 2011-11-22 NOTE — ED Notes (Signed)
Husband requested that pt also be evaluated for psychological issues. Per family, pt is not on the correct medication and has been having issues. Will continue to monitor.

## 2011-11-22 NOTE — ED Notes (Signed)
Patient refused to cooperate with vitals which is why leg B/P and aux.temp was taken.
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
## 2011-11-22 NOTE — ED Notes (Signed)
Sitter with patient and taking vitals. AIDET performed.

## 2011-11-22 NOTE — ED Provider Notes (Signed)
Social work consult complete. Recommend placement at [facility] for, dementia and hallucinations. Act team consulted for placement.  Glynn Octave, MD 11/22/11 1243

## 2011-11-22 NOTE — ED Notes (Signed)
Patient came in to the ED with left shoulder pain.  Patient has dementia however, the husband advised she had fallen out of bed and landed on her left shoulder.  The fall was not witnessed.  No obvious bruising.

## 2011-11-22 NOTE — ED Notes (Signed)
Py impulsive, trying to get OOB. Family at bedside. Pt reoriented. AC called and pt is placed on list for safety sitter. Will continue to monitor.

## 2011-11-22 NOTE — ED Notes (Signed)
Dr Otter at bedside  

## 2011-11-22 NOTE — ED Notes (Signed)
Safety sitter no longer at bedside, NT & RN informed, Charge Rn notified

## 2011-11-22 NOTE — ED Notes (Signed)
RN notified pt requesting pain medicine for back.  Offered pt other interventions such as repositioning and extra pillows.  Pt denied anything other than more pain medicine.

## 2011-11-22 NOTE — ED Notes (Signed)
Rancour, MD notified re: back pain
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
## 2011-11-22 NOTE — ED Provider Notes (Addendum)
History     CSN: [id number]  Arrival date & time 11/21/11  2149   First MD Initiated Contact with Patient 11/22/11 0100      Chief Complaint  Patient presents with  . Shoulder Pain    (Consider location/radiation/quality/duration/timing/severity/associated sxs/prior treatment) HPI 78 yo female presents to the ER c/o left shoulder pain after fall.  Family is concerned about patient's worsening mental status.  They report over the last 6 months she has been having outpatient evaluations for dementia, but not diagnosed as of yet.  Pt has seen neurology, psychiatry, and her pcm.  They report over the last 3 weeks she has gotten progressively worse.  Pt is having auditory and visual hallucinations.  She is having delusions about her husband being dead, and travelling for work.  Pt calls her husband nonstop at work demanding he come home.  She is not sleeping at night, keeping her husband up.  Family is unable to care for her further at home. Past Medical History  Diagnosis Date  . Anxiety   . Depression   . Memory loss     neuro eval WNL, seeing psych, started on gabapentin and improved  . HTN (hypertension)   . HLD (hyperlipidemia)   . Allergic rhinitis   . OSA (obstructive sleep apnea)     CPAP at night  . Migraine   . NSVD (normal spontaneous vaginal delivery)     X3  . Arthritis   . Herpes zoster 12/29/2006    Past Surgical History  Procedure Date  . Appendectomy   . Neck surgery     CALCIUM DEPOSITS  . Abdominal hysterectomy     TAH for fibroids    Family History  Problem Relation Age of Onset  . Heart attack Father     in 27's  . Alcohol abuse Father   . Lymphoma Mother   . Cancer Mother     LYMPHOMA  . Coronary artery disease Maternal Grandmother   . Breast cancer Maternal Grandmother   . Sudden death Maternal Grandfather     History  Substance Use Topics  . Smoking status: Former Smoker -- 2.0 packs/day for 8 years    Types: Cigarettes    Quit date:  06/06/1969  . Smokeless tobacco: Never Used  . Alcohol Use: No    OB History    Grav Para Term Preterm Abortions TAB SAB Ect Mult Living   4 3 3  1  1   3       Review of Systems  Unable to perform ROS: Dementia    Allergies  Other and Sulfonamide derivatives  Home Medications   Current Outpatient Rx  Name Route Sig Dispense Refill  . CALCIUM CARBONATE-VITAMIN D 600-400 MG-UNIT PO TABS Oral Take 2 tablets by mouth daily.      Marland Kitchen GLUCOSAMINE CHONDR COMPLEX PO Oral Take 2 capsules by mouth daily.      Marland Kitchen LECITHIN 1200 MG PO CAPS Oral Take 2 capsules by mouth daily.      Marland Kitchen ONE-DAILY MULTI VITAMINS PO TABS Oral Take 1 tablet by mouth daily.      . OMEGA-3-ACID ETHYL ESTERS 1 G PO CAPS Oral Take 2 g by mouth 2 (two) times daily.    Marland Kitchen AMLODIPINE BESY-BENAZEPRIL HCL 5-10 MG PO CAPS  TAKE 1 CAPSULE EVERY DAY 90 capsule 3  . ASPIRIN-ACETAMINOPHEN-CAFFEINE 250-250-65 MG PO TABS Oral Take 1 tablet by mouth every 6 (six) hours as needed.      Marland Kitchen  CYCLOBENZAPRINE HCL 5 MG PO TABS  TAKE 1 TABLET BY MOUTH TWICE A DAY AS NEEDED FOR MUSCLE SPASMS 30 tablet 0  . DULOXETINE HCL 20 MG PO CPEP Oral Take 20 mg by mouth daily.      Marland Kitchen ESTRADIOL 0.1 MG/GM VA CREA Vaginal Place 2 g vaginally daily. Apply vaginally before bedtime for one week then two times a week theeafter 42.5 g 12  . FEXOFENADINE HCL 180 MG PO TABS Oral Take 180 mg by mouth daily.      Marland Kitchen OMEPRAZOLE 40 MG PO CPDR  TAKE ONE CAPSULE EVERY DAY 30 capsule 9  . SERTRALINE HCL 100 MG PO TABS Oral Take 1 tablet (100 mg total) by mouth daily. 30 tablet 6  . SIMVASTATIN 40 MG PO TABS  TAKE 1 TABLET BY MOUTH EVERY DAY 30 tablet 6  . SUMATRIPTAN SUCCINATE 100 MG PO TABS Oral Take 100 mg by mouth every 2 (two) hours as needed.      . TOPIRAMATE 25 MG PO TABS Oral Take 50 mg by mouth daily.       BP 127/58  Pulse 74  Temp 98.4 F (36.9 C) (Oral)  Resp 16  SpO2 96%  Physical Exam  Nursing note and vitals reviewed. Constitutional: She appears  well-developed and well-nourished.  HENT:  Head: Normocephalic and atraumatic.  Right Ear: External ear normal.  Left Ear: External ear normal.  Nose: Nose normal.  Mouth/Throat: Oropharynx is clear and moist.  Eyes: Conjunctivae and EOM are normal. Pupils are equal, round, and reactive to light.  Neck: Normal range of motion. Neck supple. No JVD present. No tracheal deviation present. No thyromegaly present.  Cardiovascular: Normal rate, regular rhythm, normal heart sounds and intact distal pulses.  Exam reveals no gallop and no friction rub.   No murmur heard. Pulmonary/Chest: Effort normal and breath sounds normal. No stridor. No respiratory distress. She has no wheezes. She has no rales. She exhibits no tenderness.  Abdominal: Soft. Bowel sounds are normal. She exhibits no distension and no mass. There is no tenderness. There is no rebound and no guarding.  Musculoskeletal: Normal range of motion. She exhibits tenderness. She exhibits no edema (tendernes with palpation over low back and left posterior scapular without crepitus or deformity).  Lymphadenopathy:    She has no cervical adenopathy.  Neurological: She has normal reflexes. No cranial nerve deficit. She exhibits normal muscle tone. Coordination normal.       Oriented to self  Skin: Skin is dry. No rash noted. No erythema. No pallor.  Psychiatric: She has a normal mood and affect. Her behavior is normal. Judgment and thought content normal.    ED Course  Procedures (including critical care time)  Labs Reviewed  CBC - Abnormal; Notable for the following:    Platelets 122 (*)     All other components within normal limits  DIFFERENTIAL - Abnormal; Notable for the following:    Neutrophils Relative 41 (*)     Monocytes Relative 16 (*)     Monocytes Absolute 1.2 (*)     All other components within normal limits  BASIC METABOLIC PANEL - Abnormal; Notable for the following:    Sodium 132 (*)     Chloride 95 (*)     GFR calc  non Af Amer 58 (*)     GFR calc Af Amer 68 (*)     All other components within normal limits  URINALYSIS, ROUTINE W REFLEX MICROSCOPIC   Dg Chest 2  View  11/22/2011  *RADIOLOGY REPORT*  Clinical Data: Shortness of breath and shoulder pain.  CHEST - 2 VIEW  Comparison: None.  Findings: The lungs are well-aerated.  Mild vascular congestion is noted.  There is no evidence of focal opacification, pleural effusion or pneumothorax.  The heart is normal in size; the mediastinal contour is within normal limits.  No acute osseous abnormalities are seen.  IMPRESSION: Mild vascular congestion noted; lungs remain grossly clear.  Original Report Authenticated By: Tonia Ghent, M.D.   Dg Lumbar Spine Complete  11/22/2011  *RADIOLOGY REPORT*  Clinical Data: Lower back pain.  LUMBAR SPINE - COMPLETE 4+ VIEW  Comparison: None.  Findings: There is no evidence of fracture or subluxation. Vertebral bodies demonstrate normal height and alignment.  Mild intervertebral disc space narrowing is noted at L2-L3.  The visualized neural foramina are grossly unremarkable in appearance. Mild facet disease is noted at the lower lumbar spine.  The visualized bowel gas pattern is unremarkable in appearance; air and stool are noted within the colon.  The sacroiliac joints are within normal limits.  IMPRESSION: No evidence of fracture or subluxation along the lumbar spine.  Original Report Authenticated By: Tonia Ghent, M.D.   Dg Shoulder Left  11/21/2011  *RADIOLOGY REPORT*  Clinical Data: Left shoulder pain for 2 months.  LEFT SHOULDER - 2+ VIEW  Comparison: None.  Findings: There is no evidence of fracture or dislocation.  The left humeral head is seated within the glenoid fossa.  The acromioclavicular joint is unremarkable in appearance.  No significant soft tissue abnormalities are seen.  The visualized portions of the left lung are clear.  IMPRESSION: No evidence of fracture or dislocation.  Original Report Authenticated By:  Tonia Ghent, M.D.     1. Left shoulder pain   2. Dementia   3. Hallucination   4. Thrombocytopenia   5. Hyponatremia      Date: 11/21/2011  Rate: 84  Rhythm: normal sinus rhythm  QRS Axis: normal  Intervals: normal  ST/T Wave abnormalities: normal  Conduction Disutrbances:none  Narrative Interpretation:   Old EKG Reviewed: none available   MDM  78 year old female with ongoing dementia which has recently worsened. She has had a fall, but no acute injury noted. Her workup here is unremarkable. Discuss with hospitalist for admission given her worsening dementia, but she does not meet criteria per his evaluation. Will discuss with social work for help with possible placement        Olivia Mackie, MD 11/22/11 [phone]  Care passed to Dr Manus Gunning awaiting SW consult.  Olivia Mackie, MD 11/22/11 1191  Olivia Mackie, MD 12/20/11 2252

## 2011-11-22 NOTE — ED Notes (Addendum)
Pt wandering in hall, pt redirected to room, pt reoriented, NT to remain at bedside

## 2011-11-23 NOTE — ED Provider Notes (Signed)
The patient was accepted at [facility] for further psychiatric treatment.  Except in physician is Dr. Lowanda Foster.  Cheri Guppy, MD 11/23/11 1246

## 2011-12-26 ENCOUNTER — Ambulatory Visit (INDEPENDENT_AMBULATORY_CARE_PROVIDER_SITE_OTHER): Payer: Medicare Other | Admitting: Family Medicine

## 2011-12-26 ENCOUNTER — Encounter: Payer: Self-pay | Admitting: Family Medicine

## 2011-12-26 VITALS — BP 130/80 | HR 68 | Temp 98.0°F | Ht 62.0 in | Wt 157.0 lb

## 2011-12-26 DIAGNOSIS — M7989 Other specified soft tissue disorders: Secondary | ICD-10-CM

## 2011-12-26 DIAGNOSIS — G4733 Obstructive sleep apnea (adult) (pediatric): Secondary | ICD-10-CM

## 2011-12-26 DIAGNOSIS — F0390 Unspecified dementia without behavioral disturbance: Secondary | ICD-10-CM

## 2011-12-26 DIAGNOSIS — R32 Unspecified urinary incontinence: Secondary | ICD-10-CM

## 2011-12-26 DIAGNOSIS — I10 Essential (primary) hypertension: Secondary | ICD-10-CM

## 2011-12-26 DIAGNOSIS — F341 Dysthymic disorder: Secondary | ICD-10-CM

## 2011-12-26 LAB — POCT URINALYSIS DIPSTICK
Blood, UA: NEGATIVE
Ketones, UA: NEGATIVE
Leukocytes, UA: NEGATIVE
Nitrite, UA: NEGATIVE
Protein, UA: NEGATIVE
pH, UA: 6.5

## 2011-12-26 MED ORDER — TOPIRAMATE 25 MG PO TABS: 25.0000 mg | ORAL_TABLET | Freq: Every day | ORAL | Status: DC

## 2011-12-26 MED ORDER — SERTRALINE HCL 50 MG PO TABS: ORAL_TABLET | ORAL | Status: DC

## 2011-12-26 NOTE — Patient Instructions (Addendum)
Keep appointment with psychiatry. Stop flexeril (we were using this for migraines). Stop depakote (valproate) 500mg  (this was started either at ER or at [facility] and is a mood swing or bipolar medicine).  It's also used for headaches but I think this is what is causing the leg swelling so we will stop and monitor. I've refilled topamax 25mg  daily (headache prevention). Continue daily - sertraline, topamax, wellbutrin, aricept, namenda, omeprazole. we will call you at [phone] with urine results.

## 2011-12-26 NOTE — Progress Notes (Signed)
Subjective:    Patient ID: Brandy [name], female    DOB: Aug 24, 1929, 78 y.o.   MRN: [id number]  HPI CC: f/u recent hospitalization.  Brandy [name] is pleasant 78 yo who has not been seen since 06/2011.  Plan then was to return with husband for geriatric assessment/memory evaluation.  Never returned.  Presents with husband and son.  Daughter on phone very upset at difficulty with diagnosis.  Went to hospital at [facility] 11/21/2011 for 36 hours after fall with shoulder pain, found to have worsening disoreintation and psychotic sxs of visual hallucinations, aggression/agitation with paranoia and hostility towards family members.  Admitted to [facility] medical [facility] behavioral health 6/19-12/05/2011.  Dx of depression and psychosis 2/2 dementia.  Meds changed.  Then seen at [facility] for f/u.  Started somewhere during this period on depakote as well as risperdal and wellbutrin.  Also started on aricept 10mg  daily and namenda 5mg  bid.  Always someone at home with her.  Doing better now.  Sleeping better as well since starting medications.  Leg swelling noted during hospitalization, denies SOB, chest pain, cough.    For last week having urge incontinence.  Going more frequently.  No dysuria, abd pain, flank pain.  Fevers/chills.   Has depens at home but doesn't use.  Wt Readings from Last 3 Encounters:  12/26/11 157 lb (71.215 kg)  06/24/11 161 lb (73.029 kg)  04/26/11 165 lb 12 oz (75.184 kg)   Past Medical History  Diagnosis Date  . Anxiety   . Depression   . Memory loss     neuro eval WNL, seeing psych, started on gabapentin and improved  . HTN (hypertension)   . HLD (hyperlipidemia)   . Allergic rhinitis   . OSA (obstructive sleep apnea)     CPAP at night  . Migraine   . NSVD (normal spontaneous vaginal delivery)     X3  . Arthritis   . Herpes zoster 12/29/2006   History   Social History  . Marital Status: Married    Spouse Name: N/A    Number of Children: 3  . Years of Education:  N/A   Occupational History  . Retired    Social History Main Topics  . Smoking status: Former Smoker -- 2.0 packs/day for 8 years    Types: Cigarettes    Quit date: 06/06/1969  . Smokeless tobacco: Never Used  . Alcohol Use: No  . Drug Use: No  . Sexually Active: Not on file   Other Topics Concern  . Not on file   Social History Narrative   Caffeine: not much because of headachesRetiredMarried 26 years, husband 21 years youngerPatient has children - 3Activity: goes out to walk, not much thoughWorking on healthy eating, eats a lot of Slim fast    Review of Systems Per HPI    Objective:   Physical Exam  Nursing note and vitals reviewed. Constitutional: She appears well-developed and well-nourished. No distress.  HENT:  Head: Normocephalic and atraumatic.  Mouth/Throat: Oropharynx is clear and moist. No oropharyngeal exudate.  Eyes: Conjunctivae and EOM are normal. Pupils are equal, round, and reactive to light. No scleral icterus.  Neck: Normal range of motion. Neck supple.  Cardiovascular: Normal rate, regular rhythm, normal heart sounds and intact distal pulses.   No murmur heard. Pulmonary/Chest: Effort normal and breath sounds normal. No respiratory distress. She has no wheezes. She has no rales.  Musculoskeletal: She exhibits edema (1+ pitting edema bilaterally).  Lymphadenopathy:    She has no  cervical adenopathy.  Skin: Skin is warm and dry. No rash noted.  Psychiatric:       Somewhat blunt affect and slowed responses but does respond appropriately to questions       Assessment & Plan:  I will fax today's note to [facility].

## 2011-12-27 ENCOUNTER — Encounter: Payer: Self-pay | Admitting: Family Medicine

## 2011-12-27 DIAGNOSIS — M7989 Other specified soft tissue disorders: Secondary | ICD-10-CM | POA: Insufficient documentation

## 2011-12-27 DIAGNOSIS — N3941 Urge incontinence: Secondary | ICD-10-CM | POA: Insufficient documentation

## 2011-12-27 NOTE — Assessment & Plan Note (Signed)
Seems to correlate with starting of VPA.  Have stopped this so will monitor off this med. Did not start diuretic 2/2 concern for worsening urge incontinence. Lung exam and cardiac exam benign today.

## 2011-12-27 NOTE — Assessment & Plan Note (Signed)
Seems stable off lotrel.  Did not restart today. BP Readings from Last 3 Encounters:  12/26/11 130/80  11/23/11 111/63  06/24/11 124/78

## 2011-12-27 NOTE — Assessment & Plan Note (Signed)
Difficulty pinpointing diagnosis in past after neuro/psych evaluations.   Recent diagnosis after 10+d stay at behavioral health center. Started on aricept and namenda, seems to be tolerating meds well. Have asked her to return in 1 month for follow up. Short term memory loss present. Currently being followed by [facility].

## 2011-12-27 NOTE — Assessment & Plan Note (Signed)
Family endorses better sleeping with more regular CPAP use, however husband says sometimes it's hard to have Brandy [name] keep mask on.

## 2011-12-27 NOTE — Assessment & Plan Note (Addendum)
Mood disorder.  Prior followed by Dr Tomasa Rand. I have not seen evidence of bipolar, valproate seems to have been started during [facility] admission. Will stop for now. Continue sertraline (sent refill) as well as wellbutrin (has refill script), but will defer to psych to further manage this. Continue risperdal for recent psychosis thought related to dementia, seems to be helping significantly.  Discussed watching weight on this med. Wt Readings from Last 3 Encounters:  12/26/11 157 lb (71.215 kg)  06/24/11 161 lb (73.029 kg)  04/26/11 165 lb 12 oz (75.184 kg)

## 2011-12-27 NOTE — Assessment & Plan Note (Addendum)
Check UA today to r/o UTI as cause of recent worsening predominantly urge incontinence. Discussed this, as well as how I currently don't want to add medication until stable from psych and dementia standpoint as anticholinergics can cause cognitive decline. UA - negative for infection. Encouraged more regular use of depens especially when going out. Husband already doing some bladder training by having pt go on regular basis even if no urge. Has recently started aricept, risperdal and VPA which could all cause/worsen incontinence - for now will monitor off VPA.

## 2011-12-31 ENCOUNTER — Other Ambulatory Visit: Payer: Self-pay | Admitting: Family Medicine

## 2012-01-31 ENCOUNTER — Ambulatory Visit: Payer: Medicare Other | Admitting: Family Medicine

## 2012-02-01 ENCOUNTER — Ambulatory Visit (INDEPENDENT_AMBULATORY_CARE_PROVIDER_SITE_OTHER): Payer: Medicare Other | Admitting: Family Medicine

## 2012-02-01 ENCOUNTER — Encounter: Payer: Self-pay | Admitting: Family Medicine

## 2012-02-01 VITALS — BP 130/70 | HR 76 | Temp 98.2°F | Wt 155.0 lb

## 2012-02-01 DIAGNOSIS — M7989 Other specified soft tissue disorders: Secondary | ICD-10-CM

## 2012-02-01 DIAGNOSIS — E785 Hyperlipidemia, unspecified: Secondary | ICD-10-CM

## 2012-02-01 DIAGNOSIS — F341 Dysthymic disorder: Secondary | ICD-10-CM

## 2012-02-01 DIAGNOSIS — F0390 Unspecified dementia without behavioral disturbance: Secondary | ICD-10-CM

## 2012-02-01 DIAGNOSIS — I10 Essential (primary) hypertension: Secondary | ICD-10-CM

## 2012-02-01 DIAGNOSIS — N3941 Urge incontinence: Secondary | ICD-10-CM

## 2012-02-01 DIAGNOSIS — D696 Thrombocytopenia, unspecified: Secondary | ICD-10-CM

## 2012-02-01 MED ORDER — HYDROCHLOROTHIAZIDE 12.5 MG PO TABS: 12.5000 mg | ORAL_TABLET | Freq: Every day | ORAL | Status: DC

## 2012-02-01 MED ORDER — TOPIRAMATE 25 MG PO TABS: 25.0000 mg | ORAL_TABLET | Freq: Every day | ORAL | Status: DC

## 2012-02-01 NOTE — Assessment & Plan Note (Signed)
-  Continue zoloft  

## 2012-02-01 NOTE — Assessment & Plan Note (Signed)
Chronic, stable off meds.  Continue to monitor. BP Readings from Last 3 Encounters:  02/01/12 130/70  12/26/11 130/80  11/23/11 111/63

## 2012-02-01 NOTE — Patient Instructions (Signed)
We will hold on cholesterol medicine for now. Return at your convenience fasting for blood work - will check cholesterol then as well as other causes of leg swelling although this may just be from fluid retention so start water pill - hydrochlorothiazide 12.5mg  daily. Continue bladder training as you have been doing - scheduled times to void.  This will help urinary accidents.  If worsening we can discuss medicine called Myrbetriq for urinary incontinence. No other changes to medicines. Good to see you today, call us with questions.

## 2012-02-01 NOTE — Assessment & Plan Note (Signed)
Chronic hx.  Last check on statin and lovaza.   Currently off statin, only on fish oil 2gm daily.  Recheck FLP when returns and possibly restart statin.

## 2012-02-01 NOTE — Assessment & Plan Note (Signed)
Seems to be doing well using depens as well as with bladder training.  Encouraged husband to continue this. Briefly discussed myrbetriq, opt to wait for now 2/2 possible elevated BP side effect.  Hesitant for anticholinergics 2/2 dementia/aricept. risperdal and aricept can both worsen incontinence.

## 2012-02-01 NOTE — Assessment & Plan Note (Addendum)
Continued despite stopping valproic acid.   Denies SOB or other cardiac sxs. Check blood work when returns fasting. Start low dose diuretic - monitor incontinence. Discussed elevation of legs.  Already avoids salt. Consider compression stockings next visit.  ?CVI.  Good peripheral pulses today.

## 2012-02-01 NOTE — Assessment & Plan Note (Signed)
Continue aricept 10mg  and namenda 5mg  bid. H/o psychosis thought 2/2 dementia, on risperdal for this and tolerating well. Asked to return in 1 mo, prior fasting for blood work. Continue to follow with [facility]. Discussed private home health aide.  Husband doesn't think currently necessary, but would like information - will mail to him.

## 2012-02-01 NOTE — Progress Notes (Signed)
  Subjective:    Patient ID: Brandy [name], female    DOB: 11/28/1929, 78 y.o.   MRN: [id number]  HPI CC: 1 mo f/u  Seen Dr. Clerance Lav at [facility], has f/u planned for 8 wks.  No med changes made.  Ankle swelling - Last visit VPA stopped - continued ankle swelling.  No chest pain/tightness, SOB, no orthopnea, PNdyspnea. Continued urinary incontinence - mostly urge. H/o OSA, currently not using CPAP.  Dementia - tolerating aricept and namenda ok.  Some lip quivering noted.  Stays 2 days with children, rest of time stays with husband's mother (who is 58 yo).  Live in [place].  Feels like doing well.  Doesn't think needs home aide but would like info for private companies.  HLD - on fish oil 2 gm daily (unable to afford lovaza).  Stopped simvastatin (prior on 40mg  daily).  Due for FLP check.  Not fasting today.  Wt Readings from Last 3 Encounters:  02/01/12 155 lb (70.308 kg)  12/26/11 157 lb (71.215 kg)  06/24/11 161 lb (73.029 kg)   Past Medical History  Diagnosis Date  . Anxiety   . Depression   . Dementia     neuro eval WNL, normal MRI, eval at behavioral health center after admission with dx mild-mod dementia  . HTN (hypertension)   . HLD (hyperlipidemia)   . Allergic rhinitis   . OSA (obstructive sleep apnea)     intermittent CPAP use  . Migraine   . Arthritis   . Herpes zoster 12/29/2006  . Urinary incontinence    Review of Systems Denies nausea, abd pain, chest pain or tightness, SOB.    Objective:   Physical Exam  Nursing note and vitals reviewed. Constitutional: She appears well-developed and well-nourished. No distress.  HENT:  Head: Normocephalic and atraumatic.  Mouth/Throat: Oropharynx is clear and moist. No oropharyngeal exudate.  Eyes: Conjunctivae and EOM are normal. Pupils are equal, round, and reactive to light. No scleral icterus.  Neck: Normal range of motion. Neck supple. No JVD present. Carotid bruit is not present.  Cardiovascular:  Normal rate, regular rhythm and intact distal pulses.   Murmur (mild SEM) heard. Pulses:      Dorsalis pedis pulses are 2+ on the right side, and 2+ on the left side.  Pulmonary/Chest: Effort normal and breath sounds normal. No respiratory distress. She has no wheezes. She has no rales.  Musculoskeletal: She exhibits edema (1+ pitting edema bilaterally mainly at feet).  Lymphadenopathy:    She has no cervical adenopathy.  Skin: Skin is warm and dry. No rash noted.  Psychiatric:       Somewhat blunt affect and slowed responses but does respond appropriately to questions       Assessment & Plan:

## 2012-02-08 ENCOUNTER — Telehealth: Payer: Self-pay

## 2012-02-08 NOTE — Telephone Encounter (Signed)
Pt's wife has fallen 2-3 times over the past 2 weeks with no apparent injury; still problems with urinary incontinence; still using depends and incontinence training. Pt 's husband said this was discussed at 02/01/12 visit but pt is no better. Mr Mellott wants to know if meds could be causing pts imbalance, falling and incontinence. What is next step?Please advise.

## 2012-02-09 MED ORDER — MIRABEGRON ER 25 MG PO TB24: 25.0000 mg | ORAL_TABLET | Freq: Every day | ORAL | Status: DC

## 2012-02-09 NOTE — Telephone Encounter (Signed)
Spoke with patient's husband. He said he hasn't noticed any worsening incontinence since starting fluid pill. He did say that he hasn't noticed any improvement in her foot swelling though. He would like to try the myrbetriq to see how it works for her. Pamphlet mailed as directed.

## 2012-02-09 NOTE — Telephone Encounter (Signed)
Husband aware.

## 2012-02-09 NOTE — Telephone Encounter (Signed)
I don't think meds are causing falls but they could be worsening incontinence. Has the fluid pill for leg swelling worsened incontinence? If he'd like we can try myretriq as discussed for incontinence but would need to keep eye on blood pressure.  I can send this into pharmacy. Also please offer to send info on home health aid as I don't think this was provided at the last appointment (green pamphlet placed in Brandy [name]'s box).

## 2012-02-09 NOTE — Telephone Encounter (Signed)
Sent in.  Watch blood pressure.

## 2012-02-29 ENCOUNTER — Other Ambulatory Visit: Payer: Self-pay | Admitting: Family Medicine

## 2012-02-29 ENCOUNTER — Ambulatory Visit: Payer: 59 | Admitting: Family Medicine

## 2012-02-29 DIAGNOSIS — Z0289 Encounter for other administrative examinations: Secondary | ICD-10-CM

## 2012-04-09 ENCOUNTER — Telehealth: Payer: Self-pay

## 2012-04-09 NOTE — Telephone Encounter (Signed)
pts husband request handicap form filled out pt cannot walk 200 ft without stopping;this started 8-9 months ago.form in Dr Sharen Hones in box for completion. 2) pts husband said fluid pill 12.5 mg (Not sure of name of med ? HCTZ) is not helping swelling. Pt taking HCTZ 12.5 mg one daily. Feet and hands swelling. NO chest pain or SOB. [facility].Please advise.

## 2012-04-10 NOTE — Telephone Encounter (Signed)
plz notify form filled and ready to pick up.  Placed in Brandy [name]'s box. Pt and husband didn't keep appt 02/29/2012. If leg swelling continued, esp if now in hands, recommend come in for eval.

## 2012-04-10 NOTE — Telephone Encounter (Signed)
Patient's husband notified. He forgot about appt and apologized. Appt scheduled for a couple weeks out due to husbands work schedule. I advised that if patient had any SOB or chest pain prior to appt, then she needed to be seen at ER. He verbalized understanding.

## 2012-04-27 ENCOUNTER — Encounter: Payer: Self-pay | Admitting: Family Medicine

## 2012-04-27 ENCOUNTER — Ambulatory Visit (INDEPENDENT_AMBULATORY_CARE_PROVIDER_SITE_OTHER): Payer: Medicare Other | Admitting: Family Medicine

## 2012-04-27 VITALS — BP 144/80 | HR 76 | Temp 98.0°F | Wt 159.5 lb

## 2012-04-27 DIAGNOSIS — M7989 Other specified soft tissue disorders: Secondary | ICD-10-CM

## 2012-04-27 DIAGNOSIS — F0390 Unspecified dementia without behavioral disturbance: Secondary | ICD-10-CM

## 2012-04-27 DIAGNOSIS — D696 Thrombocytopenia, unspecified: Secondary | ICD-10-CM

## 2012-04-27 DIAGNOSIS — E785 Hyperlipidemia, unspecified: Secondary | ICD-10-CM

## 2012-04-27 DIAGNOSIS — G43009 Migraine without aura, not intractable, without status migrainosus: Secondary | ICD-10-CM

## 2012-04-27 DIAGNOSIS — I10 Essential (primary) hypertension: Secondary | ICD-10-CM

## 2012-04-27 DIAGNOSIS — F341 Dysthymic disorder: Secondary | ICD-10-CM

## 2012-04-27 DIAGNOSIS — J309 Allergic rhinitis, unspecified: Secondary | ICD-10-CM

## 2012-04-27 LAB — CBC WITH DIFFERENTIAL/PLATELET
Basophils Relative: 0 % (ref 0–1)
HCT: 37.8 % (ref 36.0–46.0)
Hemoglobin: 12.7 g/dL (ref 12.0–15.0)
Lymphocytes Relative: 29 % (ref 12–46)
Lymphs Abs: 2.6 10*3/uL (ref 0.7–4.0)
MCHC: 33.6 g/dL (ref 30.0–36.0)
Monocytes Absolute: 1.2 10*3/uL — ABNORMAL HIGH (ref 0.1–1.0)
Monocytes Relative: 14 % — ABNORMAL HIGH (ref 3–12)
Neutro Abs: 5 10*3/uL (ref 1.7–7.7)
Neutrophils Relative %: 56 % (ref 43–77)
RBC: 4.4 MIL/uL (ref 3.87–5.11)
WBC: 9 10*3/uL (ref 4.0–10.5)

## 2012-04-27 NOTE — Assessment & Plan Note (Addendum)
Did not resolve with holding VPA.  Normal cardiac and pulm exam today. Check blood work today (CMP, CBC, TSH, and microalb). Anticipate CVI - rec elevation of legs, avoiding salt (already done), and continue HCTZ.   Good pulses. Prescribed 20-73mmHg compression stockings.  Reassess next visit.  Consider increase HCTZ.

## 2012-04-27 NOTE — Assessment & Plan Note (Addendum)
Never returned for fasting blood work.  Will defer recheck for now as not fasting toda.

## 2012-04-27 NOTE — Assessment & Plan Note (Addendum)
Comes in today taking topamax, valproate, and lamictal - by 3 different prescribers.  I have stopped topamax and lamictal. Continue VPA at 500mg  ER daily, continue sertraline 100mg  in am and 50mg  in pm, and risperdal 0.5mg  bid. Continue aricept and namenda. I've asked them to request updated list of meds from [facility] to bring to me to ensure on meds she needs to be on. Also will fax today's note to [facility].

## 2012-04-27 NOTE — Patient Instructions (Addendum)
For runny nose try daily antihistamine over the counter (zyrtec, allegra or claritin) Request an updated medication list at next appointment with [facility]. Let's stop lamictal (lamotrigine) previously prescribed by Dr. Tomasa Rand as the depakote is similar. Let's stop topamax (topiramate) 25mg  daily.  We were using this for headache prevention.  Monitor migraines and if restarting, we may need topamax. I think some of this swelling is coming from venous insufficiency.  Treat with limiting salt, elevating legs, and use compression stockings - provided today.  Take prescription to durable medical equipment store for fitting.  Place in am, take off at night.

## 2012-04-27 NOTE — Assessment & Plan Note (Signed)
Mildly elevated. Continue to monitor on HCTZ 12.5mg 

## 2012-04-27 NOTE — Assessment & Plan Note (Signed)
Rec regular use of antihistamine for at least 1-2 wks to see if effective.

## 2012-04-27 NOTE — Assessment & Plan Note (Signed)
Has not had migraine in months, since starting topamax. Now back on VPA.  Will stop topamax to see if migraine controlled off this med.

## 2012-04-27 NOTE — Progress Notes (Signed)
  Subjective:    Patient ID: Brandy [name], female    DOB: 1930/01/23, 78 y.o.   MRN: [id number]  HPI CC: leg swelling, f/u  Brandy [name] presents today as 3 mo f/u, missed September's appt.  Presents with husband, Iantha Fallen.  Continues seeing [facility].  Back on depakote ER 500mg  daily (which I had d/c'd at visit in July).  Also on lamictal 100mg  at bedtime per Dr. Tomasa Rand (prior psychiatrist who they no longer see).  topamax was restarted (which I had d/c'd last visit in August).  Next appt with psych - sometime in December.  Leg swelling - continues to have swollen legs.  Never returned for requested fasting blood work.  Continues to deny chest pain, SOB, cough, dizziness.  HTN - noted trending upwards.  Last visit started HCTZ 12.5mg  daily, no noted improvement in swelling.  weight stable. BP Readings from Last 3 Encounters:  04/27/12 144/80  02/01/12 130/70  12/26/11 130/80    Wt Readings from Last 3 Encounters:  04/27/12 159 lb 8 oz (72.349 kg)  02/01/12 155 lb (70.308 kg)  12/26/11 157 lb (71.215 kg)    Noted decreased activity recently.  Past Medical History  Diagnosis Date  . Anxiety   . Depression   . Dementia     neuro eval WNL, normal MRI, eval at behavioral health center after admission with dx mild-mod dementia  . HTN (hypertension)   . HLD (hyperlipidemia)   . Allergic rhinitis   . OSA (obstructive sleep apnea)     intermittent CPAP use  . Migraine   . Arthritis   . Herpes zoster 12/29/2006  . Urinary incontinence      Review of Systems Per HPI    Objective:   Physical Exam  Nursing note and vitals reviewed. Constitutional: She appears well-developed and well-nourished. No distress.  HENT:  Head: Normocephalic and atraumatic.  Mouth/Throat: Oropharynx is clear and moist. No oropharyngeal exudate.  Eyes: Conjunctivae normal and EOM are normal. Pupils are equal, round, and reactive to light.  Neck: Normal range of motion. Neck supple.  Cardiovascular:  Normal rate, regular rhythm, normal heart sounds and intact distal pulses.   No murmur heard. Pulses:      Dorsalis pedis pulses are 2+ on the right side, and 2+ on the left side.       Posterior tibial pulses are 2+ on the right side, and 2+ on the left side.  Pulmonary/Chest: Effort normal and breath sounds normal. No respiratory distress. She has no wheezes. She has no rales.  Musculoskeletal: She exhibits edema (1+ pitting bilaterally to lower calf).  Lymphadenopathy:    She has no cervical adenopathy.  Skin: Skin is warm and dry. No rash noted.  Psychiatric: She has a normal mood and affect.       Slowed responses but responds appropriately       Assessment & Plan:

## 2012-04-27 NOTE — Assessment & Plan Note (Signed)
Continue aricept and namenda Short term memory present. H/o psychosis/agitation, controlled on risperdal

## 2012-04-28 LAB — COMPREHENSIVE METABOLIC PANEL
AST: 15 U/L (ref 0–37)
Albumin: 3.6 g/dL (ref 3.5–5.2)
BUN: 19 mg/dL (ref 6–23)
CO2: 32 mEq/L (ref 19–32)
Calcium: 9.4 mg/dL (ref 8.4–10.5)
Chloride: 95 mEq/L — ABNORMAL LOW (ref 96–112)
Creat: 0.73 mg/dL (ref 0.50–1.10)
Glucose, Bld: 76 mg/dL (ref 70–99)
Potassium: 4.1 mEq/L (ref 3.5–5.3)

## 2012-04-28 LAB — MICROALBUMIN / CREATININE URINE RATIO

## 2012-04-30 ENCOUNTER — Encounter: Payer: Self-pay | Admitting: *Deleted

## 2012-04-30 NOTE — Addendum Note (Signed)
Addended by: Liane Comber C on: 04/30/2012 11:49 AM   Modules accepted: Orders

## 2012-05-01 LAB — MICROALBUMIN / CREATININE URINE RATIO
Creatinine,U: 95.2 mg/dL
Microalb, Ur: 0.6 mg/dL (ref 0.0–1.9)

## 2012-05-04 ENCOUNTER — Other Ambulatory Visit: Payer: Self-pay | Admitting: Family Medicine

## 2012-05-04 NOTE — Telephone Encounter (Signed)
Electronic refill request.  Please advise. 

## 2012-05-17 ENCOUNTER — Telehealth: Payer: Self-pay

## 2012-05-17 NOTE — Telephone Encounter (Signed)
Spoke with husband.  Last several nights have been rough.  headaches returning, trouble sleeping at night and more argumentative. Recommended restart topamax. Called Dr. Yetta Numbers [phone]) at [facility] to touch base re patient.  She is not there today.  Asked them to leave message and have her call me back tomorrow.  Will await call. Called [name] back and discussed this.

## 2012-05-17 NOTE — Telephone Encounter (Signed)
[name] called; Topamax and Lamictal stopped on 04/27/12. Pt having problem with mental attitude; pt starting to have h/a again and is very argumentative.Pt is not sleeping well at night; pt is sleeping during the day.[facility]. Ken request call back.

## 2012-05-18 MED ORDER — TOPIRAMATE 25 MG PO TABS: 25.0000 mg | ORAL_TABLET | Freq: Every day | ORAL | Status: DC

## 2012-05-18 NOTE — Telephone Encounter (Signed)
Spoke with Dr. Yetta Numbers.  Touched base re meds, answered questions. She agrees with restarting topamax.  Will hold off on lamictal for now. Called husband and discussed above.  Sent in topamax.

## 2012-05-18 NOTE — Telephone Encounter (Signed)
Dr. Yetta Numbers called back and left message - I called her back - and left another message with Tresa Endo RN.

## 2012-05-28 ENCOUNTER — Other Ambulatory Visit: Payer: Self-pay | Admitting: Family Medicine

## 2012-06-03 ENCOUNTER — Other Ambulatory Visit: Payer: Self-pay | Admitting: Family Medicine

## 2012-07-16 ENCOUNTER — Other Ambulatory Visit: Payer: Self-pay | Admitting: Family Medicine

## 2012-08-09 ENCOUNTER — Other Ambulatory Visit: Payer: Self-pay | Admitting: Family Medicine

## 2012-08-09 MED ORDER — DONEPEZIL HCL 10 MG PO TABS: 10.0000 mg | ORAL_TABLET | Freq: Every day | ORAL | Status: DC

## 2012-08-13 ENCOUNTER — Emergency Department (HOSPITAL_COMMUNITY): Payer: Medicare Other

## 2012-08-13 ENCOUNTER — Ambulatory Visit: Payer: 59 | Admitting: Family Medicine

## 2012-08-13 ENCOUNTER — Encounter (HOSPITAL_COMMUNITY): Payer: Self-pay | Admitting: Emergency Medicine

## 2012-08-13 ENCOUNTER — Telehealth: Payer: Self-pay

## 2012-08-13 ENCOUNTER — Inpatient Hospital Stay (HOSPITAL_COMMUNITY)
Admission: EM | Admit: 2012-08-13 | Discharge: 2012-08-20 | DRG: 689 | Disposition: A | Discharge status: Skilled Nursing Facility | Payer: Medicare Other | Attending: Internal Medicine | Admitting: Internal Medicine

## 2012-08-13 DIAGNOSIS — F341 Dysthymic disorder: Secondary | ICD-10-CM

## 2012-08-13 DIAGNOSIS — R000 Tachycardia, unspecified: Secondary | ICD-10-CM | POA: Diagnosis present

## 2012-08-13 DIAGNOSIS — M7989 Other specified soft tissue disorders: Secondary | ICD-10-CM | POA: Diagnosis present

## 2012-08-13 DIAGNOSIS — H9209 Otalgia, unspecified ear: Secondary | ICD-10-CM | POA: Diagnosis not present

## 2012-08-13 DIAGNOSIS — Z66 Do not resuscitate: Secondary | ICD-10-CM | POA: Diagnosis present

## 2012-08-13 DIAGNOSIS — F329 Major depressive disorder, single episode, unspecified: Secondary | ICD-10-CM | POA: Diagnosis present

## 2012-08-13 DIAGNOSIS — E440 Moderate protein-calorie malnutrition: Secondary | ICD-10-CM | POA: Diagnosis present

## 2012-08-13 DIAGNOSIS — M129 Arthropathy, unspecified: Secondary | ICD-10-CM | POA: Diagnosis present

## 2012-08-13 DIAGNOSIS — K5900 Constipation, unspecified: Secondary | ICD-10-CM | POA: Diagnosis present

## 2012-08-13 DIAGNOSIS — Z87891 Personal history of nicotine dependence: Secondary | ICD-10-CM

## 2012-08-13 DIAGNOSIS — G2589 Other specified extrapyramidal and movement disorders: Secondary | ICD-10-CM | POA: Diagnosis present

## 2012-08-13 DIAGNOSIS — N390 Urinary tract infection, site not specified: Principal | ICD-10-CM | POA: Diagnosis present

## 2012-08-13 DIAGNOSIS — R0989 Other specified symptoms and signs involving the circulatory and respiratory systems: Secondary | ICD-10-CM

## 2012-08-13 DIAGNOSIS — G4733 Obstructive sleep apnea (adult) (pediatric): Secondary | ICD-10-CM | POA: Diagnosis present

## 2012-08-13 DIAGNOSIS — F3289 Other specified depressive episodes: Secondary | ICD-10-CM | POA: Diagnosis present

## 2012-08-13 DIAGNOSIS — J309 Allergic rhinitis, unspecified: Secondary | ICD-10-CM

## 2012-08-13 DIAGNOSIS — G43009 Migraine without aura, not intractable, without status migrainosus: Secondary | ICD-10-CM

## 2012-08-13 DIAGNOSIS — R1310 Dysphagia, unspecified: Secondary | ICD-10-CM | POA: Diagnosis present

## 2012-08-13 DIAGNOSIS — F411 Generalized anxiety disorder: Secondary | ICD-10-CM | POA: Diagnosis present

## 2012-08-13 DIAGNOSIS — F0390 Unspecified dementia without behavioral disturbance: Secondary | ICD-10-CM | POA: Diagnosis present

## 2012-08-13 DIAGNOSIS — R627 Adult failure to thrive: Secondary | ICD-10-CM | POA: Diagnosis present

## 2012-08-13 DIAGNOSIS — M62838 Other muscle spasm: Secondary | ICD-10-CM | POA: Diagnosis present

## 2012-08-13 DIAGNOSIS — G9341 Metabolic encephalopathy: Secondary | ICD-10-CM | POA: Diagnosis present

## 2012-08-13 DIAGNOSIS — E860 Dehydration: Secondary | ICD-10-CM | POA: Diagnosis present

## 2012-08-13 DIAGNOSIS — I10 Essential (primary) hypertension: Secondary | ICD-10-CM

## 2012-08-13 DIAGNOSIS — T4395XA Adverse effect of unspecified psychotropic drug, initial encounter: Secondary | ICD-10-CM | POA: Diagnosis not present

## 2012-08-13 DIAGNOSIS — A498 Other bacterial infections of unspecified site: Secondary | ICD-10-CM | POA: Diagnosis present

## 2012-08-13 DIAGNOSIS — I4892 Unspecified atrial flutter: Secondary | ICD-10-CM

## 2012-08-13 DIAGNOSIS — Y9210 Unspecified residential institution as the place of occurrence of the external cause: Secondary | ICD-10-CM | POA: Diagnosis not present

## 2012-08-13 DIAGNOSIS — Z6825 Body mass index (BMI) 25.0-25.9, adult: Secondary | ICD-10-CM

## 2012-08-13 DIAGNOSIS — E876 Hypokalemia: Secondary | ICD-10-CM | POA: Diagnosis present

## 2012-08-13 DIAGNOSIS — E785 Hyperlipidemia, unspecified: Secondary | ICD-10-CM | POA: Diagnosis present

## 2012-08-13 DIAGNOSIS — K219 Gastro-esophageal reflux disease without esophagitis: Secondary | ICD-10-CM | POA: Diagnosis present

## 2012-08-13 LAB — URINALYSIS, ROUTINE W REFLEX MICROSCOPIC
Ketones, ur: NEGATIVE mg/dL
Nitrite: NEGATIVE
pH: 6 (ref 5.0–8.0)

## 2012-08-13 LAB — CBC WITH DIFFERENTIAL/PLATELET
Basophils Absolute: 0 10*3/uL (ref 0.0–0.1)
Lymphocytes Relative: 8 % — ABNORMAL LOW (ref 12–46)
Neutro Abs: 10.3 10*3/uL — ABNORMAL HIGH (ref 1.7–7.7)
Neutrophils Relative %: 76 % (ref 43–77)
Platelets: 217 10*3/uL (ref 150–400)
RDW: 13.3 % (ref 11.5–15.5)
WBC: 13.6 10*3/uL — ABNORMAL HIGH (ref 4.0–10.5)

## 2012-08-13 LAB — COMPREHENSIVE METABOLIC PANEL
ALT: 53 U/L — ABNORMAL HIGH (ref 0–35)
AST: 61 U/L — ABNORMAL HIGH (ref 0–37)
Alkaline Phosphatase: 97 U/L (ref 39–117)
CO2: 29 mEq/L (ref 19–32)
Chloride: 106 mEq/L (ref 96–112)
GFR calc Af Amer: 90 mL/min — ABNORMAL LOW (ref >90)
Sodium: 146 mEq/L — ABNORMAL HIGH (ref 135–145)

## 2012-08-13 LAB — URINE MICROSCOPIC-ADD ON

## 2012-08-13 LAB — PROTIME-INR: Prothrombin Time: 15.4 seconds — ABNORMAL HIGH (ref 11.6–15.2)

## 2012-08-13 LAB — APTT: aPTT: 39 seconds — ABNORMAL HIGH (ref 24–37)

## 2012-08-13 MED ORDER — BUPROPION HCL ER (XL) 150 MG PO TB24
150.0000 mg | ORAL_TABLET | Freq: Every day | ORAL | Status: DC
  Administered 2012-08-13 – 2012-08-18 (×6): 150 mg via ORAL
  Filled 2012-08-13 (×7): qty 1

## 2012-08-13 MED ORDER — DEXTROSE 5 % IV SOLN
1.0000 g | Freq: Once | INTRAVENOUS | Status: AC
  Administered 2012-08-13: 1 g via INTRAVENOUS
  Filled 2012-08-13: qty 10

## 2012-08-13 MED ORDER — RISPERIDONE 0.5 MG PO TABS
0.5000 mg | ORAL_TABLET | Freq: Two times a day (BID) | ORAL | Status: DC
  Administered 2012-08-13 – 2012-08-20 (×14): 0.5 mg via ORAL
  Filled 2012-08-13 (×15): qty 1

## 2012-08-13 MED ORDER — DONEPEZIL HCL 10 MG PO TABS
10.0000 mg | ORAL_TABLET | Freq: Every day | ORAL | Status: DC
  Administered 2012-08-13 – 2012-08-19 (×7): 10 mg via ORAL
  Filled 2012-08-13 (×8): qty 1

## 2012-08-13 MED ORDER — MEMANTINE HCL 5 MG PO TABS
5.0000 mg | ORAL_TABLET | Freq: Two times a day (BID) | ORAL | Status: DC
  Administered 2012-08-13 – 2012-08-20 (×14): 5 mg via ORAL
  Filled 2012-08-13 (×15): qty 1

## 2012-08-13 MED ORDER — POTASSIUM CHLORIDE 10 MEQ/100ML IV SOLN
10.0000 meq | INTRAVENOUS | Status: DC
  Administered 2012-08-13: 10 meq via INTRAVENOUS
  Filled 2012-08-13 (×2): qty 100

## 2012-08-13 MED ORDER — SODIUM CHLORIDE 0.9 % IV SOLN: 1.0000 g | Freq: Once | INTRAVENOUS | Status: DC

## 2012-08-13 MED ORDER — DEXTROSE 5 % IV SOLN
1.0000 g | INTRAVENOUS | Status: AC
  Administered 2012-08-13 – 2012-08-16 (×4): 1 g via INTRAVENOUS
  Filled 2012-08-13 (×4): qty 10

## 2012-08-13 MED ORDER — SODIUM CHLORIDE 0.9 % IV SOLN
INTRAVENOUS | Status: DC
  Administered 2012-08-13 – 2012-08-14 (×2): via INTRAVENOUS

## 2012-08-13 MED ORDER — DIVALPROEX SODIUM ER 500 MG PO TB24
500.0000 mg | ORAL_TABLET | Freq: Every day | ORAL | Status: DC
  Administered 2012-08-13 – 2012-08-18 (×6): 500 mg via ORAL
  Filled 2012-08-13 (×7): qty 1

## 2012-08-13 MED ORDER — SODIUM CHLORIDE 0.9 % IV SOLN: INTRAVENOUS | Status: DC

## 2012-08-13 MED ORDER — SERTRALINE HCL 50 MG PO TABS
50.0000 mg | ORAL_TABLET | Freq: Every day | ORAL | Status: DC
  Administered 2012-08-13 – 2012-08-19 (×7): 50 mg via ORAL
  Filled 2012-08-13 (×8): qty 1

## 2012-08-13 MED ORDER — TOPIRAMATE 25 MG PO TABS
25.0000 mg | ORAL_TABLET | Freq: Every day | ORAL | Status: DC
  Administered 2012-08-13 – 2012-08-20 (×8): 25 mg via ORAL
  Filled 2012-08-13 (×8): qty 1

## 2012-08-13 MED ORDER — SERTRALINE HCL 50 MG PO TABS: 50.0000 mg | ORAL_TABLET | Freq: Two times a day (BID) | ORAL | Status: DC

## 2012-08-13 MED ORDER — POTASSIUM CHLORIDE CRYS ER 20 MEQ PO TBCR
60.0000 meq | EXTENDED_RELEASE_TABLET | Freq: Four times a day (QID) | ORAL | Status: AC
  Administered 2012-08-13 (×2): 60 meq via ORAL
  Filled 2012-08-13 (×2): qty 3

## 2012-08-13 MED ORDER — SODIUM CHLORIDE 0.9 % IJ SOLN
3.0000 mL | Freq: Two times a day (BID) | INTRAMUSCULAR | Status: DC
  Administered 2012-08-19: 3 mL via INTRAVENOUS

## 2012-08-13 MED ORDER — SIMVASTATIN 40 MG PO TABS
40.0000 mg | ORAL_TABLET | Freq: Every evening | ORAL | Status: DC
  Administered 2012-08-13 – 2012-08-18 (×6): 40 mg via ORAL
  Filled 2012-08-13 (×7): qty 1

## 2012-08-13 MED ORDER — PANTOPRAZOLE SODIUM 40 MG PO TBEC
40.0000 mg | DELAYED_RELEASE_TABLET | Freq: Every day | ORAL | Status: DC
  Administered 2012-08-13 – 2012-08-18 (×6): 40 mg via ORAL
  Filled 2012-08-13 (×4): qty 1

## 2012-08-13 MED ORDER — OMEGA-3-ACID ETHYL ESTERS 1 G PO CAPS
1.0000 g | ORAL_CAPSULE | Freq: Two times a day (BID) | ORAL | Status: DC
  Administered 2012-08-13 – 2012-08-18 (×11): 1 g via ORAL
  Filled 2012-08-13 (×13): qty 1

## 2012-08-13 MED ORDER — SERTRALINE HCL 100 MG PO TABS
100.0000 mg | ORAL_TABLET | Freq: Every day | ORAL | Status: DC
  Administered 2012-08-14 – 2012-08-20 (×7): 100 mg via ORAL
  Filled 2012-08-13 (×7): qty 1

## 2012-08-13 MED ORDER — HEPARIN SODIUM (PORCINE) 5000 UNIT/ML IJ SOLN
5000.0000 [IU] | Freq: Three times a day (TID) | INTRAMUSCULAR | Status: DC
  Administered 2012-08-13 – 2012-08-20 (×20): 5000 [IU] via SUBCUTANEOUS
  Filled 2012-08-13 (×25): qty 1

## 2012-08-13 NOTE — ED Notes (Signed)
Patient lives at home with family who called EMS. EMS reported patient general weakness fatigue onset 2-3 days ago, warm to touch, and right side facial droop unknown onset reported by EMS.  Able to follow commands appropriate history of dementia.

## 2012-08-13 NOTE — ED Notes (Signed)
#   14 foley cath inserted per order

## 2012-08-13 NOTE — ED Provider Notes (Signed)
History     CSN: [id number]  Arrival date & time 08/13/12  1018   First MD Initiated Contact with Patient 08/13/12 1021      Chief Complaint  Patient presents with  . Fatigue  . Altered Mental Status    (Consider location/radiation/quality/duration/timing/severity/associated sxs/prior treatment) HPI Level 5 caveat due to dementia. Per husband patient has been feeling unwell x 6 days with increased weakness x 3 days and no longer able to transfer. Lives at home.  Past Medical History  Diagnosis Date  . Anxiety   . Depression   . Dementia     neuro eval WNL, normal MRI, eval at behavioral health center after admission with dx mild-mod dementia  . HTN (hypertension)   . HLD (hyperlipidemia)   . Allergic rhinitis   . OSA (obstructive sleep apnea)     intermittent CPAP use  . Migraine   . Arthritis   . Herpes zoster 12/29/2006  . Urinary incontinence     Past Surgical History  Procedure Laterality Date  . Appendectomy    . Neck surgery      CALCIUM DEPOSITS  . Abdominal hysterectomy      TAH for fibroids    Family History  Problem Relation Age of Onset  . Heart attack Father     in 76's  . Alcohol abuse Father   . Lymphoma Mother   . Cancer Mother     LYMPHOMA  . Coronary artery disease Maternal Grandmother   . Breast cancer Maternal Grandmother   . Sudden death Maternal Grandfather     History  Substance Use Topics  . Smoking status: Former Smoker -- 2.00 packs/day for 8 years    Types: Cigarettes    Quit date: 06/06/1969  . Smokeless tobacco: Never Used  . Alcohol Use: No    OB History   Grav Para Term Preterm Abortions TAB SAB Ect Mult Living   4 3 3  1  1   3       Review of Systems  Unable to perform ROS: Dementia    Allergies  Other and Sulfonamide derivatives  Home Medications   Current Outpatient Rx  Name  Route  Sig  Dispense  Refill  . buPROPion (WELLBUTRIN XL) 150 MG 24 hr tablet   Oral   Take 150 mg by mouth daily.           . Calcium Carbonate-Vitamin D (CALCIUM 600+D) 600-400 MG-UNIT per tablet   Oral   Take 2 tablets by mouth daily. For bones         . divalproex (DEPAKOTE ER) 500 MG 24 hr tablet   Oral   Take 500 mg by mouth daily.         Marland Kitchen donepezil (ARICEPT) 10 MG tablet   Oral   Take 1 tablet (10 mg total) by mouth at bedtime.   30 tablet   1   . fexofenadine (ALLEGRA) 180 MG tablet   Oral   Take 180 mg by mouth daily. As needed for allergies         . hydrochlorothiazide (MICROZIDE) 12.5 MG capsule   Oral   Take 12.5 mg by mouth daily.         . memantine (NAMENDA) 5 MG tablet   Oral   Take 5 mg by mouth 2 (two) times daily.         . Multiple Vitamin (MULTIVITAMIN) tablet   Oral   Take 1 tablet by mouth daily.           Marland Kitchen  Omega-3 Fatty Acids (FISH OIL) 300 MG CAPS   Oral   Take 1 capsule by mouth 2 (two) times daily.         Marland Kitchen omeprazole (PRILOSEC) 40 MG capsule   Oral   Take 1 capsule (40 mg total) by mouth daily. For reflux   30 capsule   11   . risperiDONE (RISPERDAL) 0.5 MG tablet   Oral   Take 0.5 mg by mouth 2 (two) times daily.         . sertraline (ZOLOFT) 50 MG tablet   Oral   Take 50-100 mg by mouth 2 (two) times daily. Take 2 tabs in the morning and 1 tab in the evening         . simvastatin (ZOCOR) 40 MG tablet   Oral   Take 40 mg by mouth every evening.         . topiramate (TOPAMAX) 25 MG tablet   Oral   Take 1 tablet (25 mg total) by mouth daily.   30 tablet   11     BP 133/65  Pulse 88  Temp(Src) 99.4 F (37.4 C) (Rectal)  Resp 20  SpO2 98%  Physical Exam  Nursing note and vitals reviewed. Constitutional: She appears well-developed and well-nourished.  Strong smell of urine  HENT:  Head: Normocephalic and atraumatic.  Mouth/Throat: Oropharynx is clear and moist.  Question R lower facial droop. When smiling less obvious.   Eyes: EOM are normal. Pupils are equal, round, and reactive to light.  Neck: Normal range of  motion. Neck supple.  No meningimus  Cardiovascular: Normal rate and regular rhythm.   Pulmonary/Chest: Effort normal and breath sounds normal. No respiratory distress. She has no wheezes. She has no rales. She exhibits no tenderness.  Abdominal: Soft. Bowel sounds are normal. She exhibits no distension and no mass. There is no tenderness. There is no rebound and no guarding.  Musculoskeletal: Normal range of motion. She exhibits no edema and no tenderness.  Neurological: She is alert.  intermittently following commands. 4/5 strength bl UE. 1/5 strength bl LE.   Skin: Skin is warm and dry. No rash noted. No erythema.    ED Course  Procedures (including critical care time)  Labs Reviewed  CBC WITH DIFFERENTIAL - Abnormal; Notable for the following:    WBC 13.6 (*)    Neutro Abs 10.3 (*)    Lymphocytes Relative 8 (*)    Monocytes Relative 16 (*)    Monocytes Absolute 2.1 (*)    All other components within normal limits  COMPREHENSIVE METABOLIC PANEL - Abnormal; Notable for the following:    Sodium 146 (*)    Potassium 2.9 (*)    Glucose, Bld 122 (*)    BUN 33 (*)    Albumin 2.5 (*)    AST 61 (*)    ALT 53 (*)    GFR calc non Af Amer 78 (*)    GFR calc Af Amer 90 (*)    All other components within normal limits  URINALYSIS, ROUTINE W REFLEX MICROSCOPIC - Abnormal; Notable for the following:    Color, Urine AMBER (*)    APPearance CLOUDY (*)    Hgb urine dipstick MODERATE (*)    Protein, ur 100 (*)    Urobilinogen, UA 2.0 (*)    Leukocytes, UA MODERATE (*)    All other components within normal limits  PROTIME-INR - Abnormal; Notable for the following:    Prothrombin Time 15.4 (*)  All other components within normal limits  APTT - Abnormal; Notable for the following:    aPTT 39 (*)    All other components within normal limits  URINE MICROSCOPIC-ADD ON - Abnormal; Notable for the following:    Bacteria, UA MANY (*)    All other components within normal limits  URINE  CULTURE  POCT I-STAT TROPONIN I  CG4 I-STAT (LACTIC ACID)   Dg Chest 2 View  08/13/2012  *RADIOLOGY REPORT*  Clinical Data: Fatigue, altered mental status.  CHEST - 2 VIEW  Comparison: November 22, 2011.  Findings: Cardiomediastinal silhouette appears normal.  No acute pulmonary disease is noted.  Bony thorax is intact.  IMPRESSION: No acute cardiopulmonary abnormality seen.   Original Report Authenticated By: Lupita Raider.,  M.D.    Ct Head Wo Contrast  08/13/2012  *RADIOLOGY REPORT*  Clinical Data: Fatigue, altered mental status, weakness, some right facial droop  CT HEAD WITHOUT CONTRAST  Technique:  Contiguous axial images were obtained from the base of the skull through the vertex without contrast.  Comparison: MR brain scan of 07/01/2010  Findings: The ventricular system is prominent as are the cortical sulci, indicative of diffuse atrophy.  The septum is midline in position.  Moderate small vessel ischemic change is noted throughout the periventricular white matter.  No hemorrhage, mass lesion, or acute infarction is seen.  No calvarial abnormality is seen.  The paranasal sinuses are well pneumatized.  IMPRESSION: Atrophy and moderate small vessel ischemic change.  No acute intracranial abnormality.   Original Report Authenticated By: Dwyane Dee, M.D.      1. UTI (urinary tract infection)   2. Hypokalemia   3. Dehydration   4. Chest congestion   5. Dementia      Date: 08/13/2012  Rate: 91  Rhythm: normal sinus rhythm  QRS Axis: normal  Intervals: normal  ST/T Wave abnormalities: nonspecific T wave changes  Conduction Disutrbances:none  Narrative Interpretation:   Old EKG Reviewed: changes noted    MDM    [facility] to see and admit.       Loren Racer, MD 08/13/12 [phone]

## 2012-08-13 NOTE — ED Notes (Signed)
lactic Acid WNL

## 2012-08-13 NOTE — H&P (Signed)
[facility] History and Physical  Brandy [name] UJW:[id number] DOB: 09/12/1929 DOA: 08/13/2012  Referring physician: Loren Racer, MD PCP: Eustaquio Boyden, MD   Chief Complaint: Generalized weakness  HPI: Brandy [name] is a 79 y.o. female with past medical history of anxiety, depression and dementia. Patient has previous evaluation to [facility]. Patient brought to the hospital because of generalized weakness and altered mental status. Patient is poor historian because of dementia, the history was obtained from her son who does not live with her. Son said patient functional and overall health status is declining for the past few month, and for the past one week she is getting very weak, poor appetite and she doesn't want to eat or drink. She got very weak to the point she couldn't walk and they brought her to the hospital today for further evaluation. Upon initial evaluation in the emergency department, CT scan of the head showed no abnormalities, urinalysis consistent with UTI, BMP is consistent with dehydration and hypokalemia.  Review of Systems: Unobtainable because of dementia we'll  Past Medical History  Diagnosis Date  . Anxiety   . Depression   . Dementia     neuro eval WNL, normal MRI, eval at behavioral health center after admission with dx mild-mod dementia  . HTN (hypertension)   . HLD (hyperlipidemia)   . Allergic rhinitis   . OSA (obstructive sleep apnea)     intermittent CPAP use  . Migraine   . Arthritis   . Herpes zoster 12/29/2006  . Urinary incontinence    Past Surgical History  Procedure Laterality Date  . Appendectomy    . Neck surgery      CALCIUM DEPOSITS  . Abdominal hysterectomy      TAH for fibroids   Social History:  reports that she quit smoking about 43 years ago. Her smoking use included Cigarettes. She has a 16 pack-year smoking history. She has never used smokeless tobacco. She reports that she does not drink  alcohol or use illicit drugs. The patient lives at home with husband  Allergies  Allergen Reactions  . Other     CRANBERRY JUICE  . Sulfonamide Derivatives     REACTION: Can't remember    Family History  Problem Relation Age of Onset  . Heart attack Father     in 92's  . Alcohol abuse Father   . Lymphoma Mother   . Cancer Mother     LYMPHOMA  . Coronary artery disease Maternal Grandmother   . Breast cancer Maternal Grandmother   . Sudden death Maternal Grandfather     Prior to Admission medications   Medication Sig Start Date End Date Taking? Authorizing Provider  buPROPion (WELLBUTRIN XL) 150 MG 24 hr tablet Take 150 mg by mouth daily.    Yes Historical Provider, MD  Calcium Carbonate-Vitamin D (CALCIUM 600+D) 600-400 MG-UNIT per tablet Take 2 tablets by mouth daily. For bones   Yes Historical Provider, MD  divalproex (DEPAKOTE ER) 500 MG 24 hr tablet Take 500 mg by mouth daily.   Yes Historical Provider, MD  donepezil (ARICEPT) 10 MG tablet Take 1 tablet (10 mg total) by mouth at bedtime. 08/09/12  Yes Eustaquio Boyden, MD  fexofenadine (ALLEGRA) 180 MG tablet Take 180 mg by mouth daily. As needed for allergies   Yes Historical Provider, MD  hydrochlorothiazide (MICROZIDE) 12.5 MG capsule Take 12.5 mg by mouth daily.   Yes Historical Provider, MD  memantine (NAMENDA) 5 MG tablet Take 5  mg by mouth 2 (two) times daily.   Yes Historical Provider, MD  Multiple Vitamin (MULTIVITAMIN) tablet Take 1 tablet by mouth daily.     Yes Historical Provider, MD  Omega-3 Fatty Acids (FISH OIL) 300 MG CAPS Take 1 capsule by mouth 2 (two) times daily.   Yes Historical Provider, MD  omeprazole (PRILOSEC) 40 MG capsule Take 1 capsule (40 mg total) by mouth daily. For reflux 12/31/11  Yes Eustaquio Boyden, MD  risperiDONE (RISPERDAL) 0.5 MG tablet Take 0.5 mg by mouth 2 (two) times daily.   Yes Historical Provider, MD  sertraline (ZOLOFT) 50 MG tablet Take 50-100 mg by mouth 2 (two) times daily. Take  2 tabs in the morning and 1 tab in the evening   Yes Historical Provider, MD  simvastatin (ZOCOR) 40 MG tablet Take 40 mg by mouth every evening.   Yes Historical Provider, MD  topiramate (TOPAMAX) 25 MG tablet Take 1 tablet (25 mg total) by mouth daily. 05/18/12  Yes Eustaquio Boyden, MD   Physical Exam: Filed Vitals:   08/13/12 1022 08/13/12 1023 08/13/12 1145 08/13/12 1406  BP: 121/70  133/65   Pulse: 91  88   Temp: 99.7 F (37.6 C)  100.9 F (38.3 C) 99.4 F (37.4 C)  TempSrc: Oral  Rectal   Resp: 24  20   SpO2: 90% 95% 98%    General appearance: alert, cooperative and no distress  Head: Normocephalic, without obvious abnormality, atraumatic  Eyes: conjunctivae/corneas clear. PERRL, EOM's intact. Fundi benign.  Nose: Nares normal. Septum midline. Mucosa normal. No drainage or sinus tenderness.  Throat: lips, mucosa, and tongue normal; teeth and gums normal  Neck: Supple, no masses, no cervical lymphadenopathy, no JVD appreciated, no meningeal signs Resp: clear to auscultation bilaterally  Chest wall: no tenderness  Cardio: regular rate and rhythm, S1, S2 normal, no murmur, click, rub or gallop  GI: soft, non-tender; bowel sounds normal; no masses, no organomegaly  Extremities: extremities normal, atraumatic, no cyanosis or edema  Skin: Skin color, texture, turgor normal. No rashes or lesions  Neurologic: Alert and oriented X 3, normal strength and tone. Normal symmetric reflexes. Normal coordination and gait   Labs on Admission:  Basic Metabolic Panel:  Recent Labs Lab 08/13/12 1052  NA 146*  K 2.9*  CL 106  CO2 29  GLUCOSE 122*  BUN 33*  CREATININE 0.76  CALCIUM 9.3   Liver Function Tests:  Recent Labs Lab 08/13/12 1052  AST 61*  ALT 53*  ALKPHOS 97  BILITOT 0.4  PROT 6.9  ALBUMIN 2.5*   No results found for this basename: LIPASE, AMYLASE,  in the last 168 hours No results found for this basename: AMMONIA,  in the last 168 hours CBC:  Recent  Labs Lab 08/13/12 1052  WBC 13.6*  NEUTROABS 10.3*  HGB 13.4  HCT 39.1  MCV 90.5  PLT 217   Cardiac Enzymes: No results found for this basename: CKTOTAL, CKMB, CKMBINDEX, TROPONINI,  in the last 168 hours  BNP (last 3 results) No results found for this basename: PROBNP,  in the last 8760 hours CBG: No results found for this basename: GLUCAP,  in the last 168 hours  Radiological Exams on Admission: Dg Chest 2 View  08/13/2012  *RADIOLOGY REPORT*  Clinical Data: Fatigue, altered mental status.  CHEST - 2 VIEW  Comparison: November 22, 2011.  Findings: Cardiomediastinal silhouette appears normal.  No acute pulmonary disease is noted.  Bony thorax is intact.  IMPRESSION: No acute  cardiopulmonary abnormality seen.   Original Report Authenticated By: Lupita Raider.,  M.D.    Ct Head Wo Contrast  08/13/2012  *RADIOLOGY REPORT*  Clinical Data: Fatigue, altered mental status, weakness, some right facial droop  CT HEAD WITHOUT CONTRAST  Technique:  Contiguous axial images were obtained from the base of the skull through the vertex without contrast.  Comparison: MR brain scan of 07/01/2010  Findings: The ventricular system is prominent as are the cortical sulci, indicative of diffuse atrophy.  The septum is midline in position.  Moderate small vessel ischemic change is noted throughout the periventricular white matter.  No hemorrhage, mass lesion, or acute infarction is seen.  No calvarial abnormality is seen.  The paranasal sinuses are well pneumatized.  IMPRESSION: Atrophy and moderate small vessel ischemic change.  No acute intracranial abnormality.   Original Report Authenticated By: Dwyane Dee, M.D.     EKG: Independently reviewed.   Assessment/Plan Active Problems:   HYPERLIPIDEMIA   HYPERTENSION   Dementia   Leg swelling   UTI (lower urinary tract infection)   UTI Patient started on Rocephin empirically, blood in urine culture obtained. Antibiotic will be adjusted according to culture  results. Probably this is causing the altered mental status.  Generalized weakness Patient does not have good functional status to begin with. Per son her functional status is declining over the past several months. She has good grip bilaterally, both lower extremities very weak, no lateralization. CT scan of the head is negative for acute intracranial events. Patient favors to look towards the left side, son says this is chronic.  Altered mental status   She more confused less talkative and interactive per son. Also has poor appetite. These findings likely consistent with her UTI, cannot rule out worsening of dementia.  Dementia Continue her psychotropic medications, patient has dementia with agitation. She had previous admission to [facility] ger[facility] facility, no details available. Currently she is calm, continue home medications.  Expect to have sundowning and agitation during hospital stay.   Code Status:  assumed to be full code  Family Communication:  son is at bedside.  Disposition Plan:  70 minutes, anticipate the length of the stay to be more than 2 midnights   Time spent:  70 minutes   ELMAHI,MUTAZ A [facility] Pager [phone]   If 7PM-7AM, please contact night-coverage [email or web address] Password [facility] 08/13/2012, 2:43 PM

## 2012-08-13 NOTE — Telephone Encounter (Signed)
pts husband said he cannot physically pick pt up anymore. Pt worsened over weekend; not able to walk , had to force feed pt. Pt had appt with Dr Reece Agar this morning but Mr Braddock said he cannot get her to the office. Dr Reece Agar said to cancel appt and take pt to ED. Dr Reece Agar spoke with pt's husband. Office appt cancelled.

## 2012-08-14 DIAGNOSIS — G9341 Metabolic encephalopathy: Secondary | ICD-10-CM

## 2012-08-14 DIAGNOSIS — G92 Toxic encephalopathy: Secondary | ICD-10-CM | POA: Diagnosis present

## 2012-08-14 LAB — BASIC METABOLIC PANEL
Chloride: 113 mEq/L — ABNORMAL HIGH (ref 96–112)
Creatinine, Ser: 0.67 mg/dL (ref 0.50–1.10)
GFR calc Af Amer: >90 mL/min (ref >90)
GFR calc non Af Amer: 81 mL/min — ABNORMAL LOW (ref >90)

## 2012-08-14 LAB — CBC
HCT: 37.2 % (ref 36.0–46.0)
Platelets: 197 10*3/uL (ref 150–400)
RDW: 13.8 % (ref 11.5–15.5)
WBC: 10.5 10*3/uL (ref 4.0–10.5)

## 2012-08-14 LAB — TSH: TSH: 1.516 u[IU]/mL (ref 0.350–4.500)

## 2012-08-14 MED ORDER — HALOPERIDOL LACTATE 5 MG/ML IJ SOLN: 1.0000 mg | Freq: Four times a day (QID) | INTRAMUSCULAR | Status: DC | PRN

## 2012-08-14 MED ORDER — SODIUM CHLORIDE 0.45 % IV SOLN
INTRAVENOUS | Status: DC
  Administered 2012-08-14 – 2012-08-15 (×2): via INTRAVENOUS
  Administered 2012-08-17: 20 mL/h via INTRAVENOUS

## 2012-08-14 MED ORDER — BIOTENE DRY MOUTH MT LIQD
15.0000 mL | Freq: Two times a day (BID) | OROMUCOSAL | Status: DC
  Administered 2012-08-14 – 2012-08-20 (×12): 15 mL via OROMUCOSAL

## 2012-08-14 NOTE — Evaluation (Signed)
Physical Therapy Evaluation Patient Details Name: Brandy [name] MRN: [id number] DOB: 03/04/1930 Today's Date: 08/14/2012 Time: 1330-1350 PT Time Calculation (min): 20 min  PT Assessment / Plan / Recommendation Clinical Impression  79 yo female with progressive dementia admitted with dehydration. Pt also with decreased ROM and pain in right knee. Pt was unable to assist with functional mobility today. Anticipate she will need 24/7 care at max assist level with mechanical lift for transfers. Will follow for trial of PT in hopes of gaining functional improvement ti decrease burden of care      PT Assessment  Patient needs continued PT services    Follow Up Recommendations  SNF    Does the patient have the potential to tolerate intense rehabilitation      Barriers to Discharge        Equipment Recommendations  Wheelchair (measurements PT);Wheelchair cushion (measurements PT);Other (comment) (mechanical lift)    Recommendations for Other Services     Frequency Min 3X/week (Trial of PT)    Precautions / Restrictions Precautions Precautions: Fall   Pertinent Vitals/Pain C/o pain in right knee with attempted ROM      Mobility  Bed Mobility Bed Mobility: Rolling Left;Left Sidelying to Sit Rolling Left: 1: +2 Total assist Rolling Left: Patient Percentage: 0% Left Sidelying to Sit: 1: +2 Total assist;HOB elevated Left Sidelying to Sit: Patient Percentage: 0% Details for Bed Mobility Assistance: pt did not assist with movement Transfers Transfers: Squat Pivot Transfers;Sit to Stand;Stand to Sit Sit to Stand: 1: +2 Total assist;From chair/3-in-1 Sit to Stand: Patient Percentage: 10% Stand to Sit: 1: +2 Total assist Stand to Sit: Patient Percentage: 10% Squat Pivot Transfers: 1: +2 Total assist Squat Pivot Transfers: Patient Percentage: 10% Details for Transfer Assistance: pt required total assist to pivot to chair with pad under hips. Maximove pad placed in chair and nurse  tech informed to use mechanical lift for back to bed  Attempted to stand from chair with RW, but pt was unabale to straighten legs and bear weight through them Ambulation/Gait Ambulation/Gait Assistance: Not tested (comment) Assistive device: Rolling walker Stairs: No Wheelchair Mobility Wheelchair Mobility: No    Exercises     PT Diagnosis: Difficulty walking;Generalized weakness;Acute pain  PT Problem List: Decreased range of motion;Decreased strength;Decreased activity tolerance;Decreased mobility;Decreased cognition PT Treatment Interventions: Functional mobility training;Balance training   PT Goals Acute Rehab PT Goals PT Goal Formulation: With family Time For Goal Achievement: 08/28/12 Potential to Achieve Goals: Fair Pt will go Supine/Side to Sit: with min assist PT Goal: Supine/Side to Sit - Progress: Goal set today Pt will go Sit to Supine/Side: with min assist PT Goal: Sit to Supine/Side - Progress: Goal set today Pt will Transfer Bed to Chair/Chair to Bed: with min assist PT Transfer Goal: Bed to Chair/Chair to Bed - Progress: Goal set today  Visit Information  Last PT Received On: 08/14/12 Assistance Needed: +2    Subjective Data  Subjective: pt keeps eyes closed most of time, but occasionally will speak appropriately to conversation Patient Stated Goal: son reports there will be a family meeting in am   Prior Functioning  Home Living Lives With: Spouse Available Help at Discharge: Family Type of Home: House Home Access: Stairs to enter Secretary/administrator of Steps: 1 Home Layout: One level Home Adaptive Equipment: Walker - rolling Prior Function Level of Independence: Needs assistance Comments: pt was walking about  3 weeks ago , but has had progressive decline with falls recently until pt has reached  the point where she cannot stand on her legs Communication Communication: Other (comment) (communication is intermittent)    Cognition   Cognition Overall Cognitive Status: Impaired (son reports progressive decline) Area of Impairment: Attention;Memory;Following commands;Safety/judgement;Awareness of errors;Awareness of deficits;Problem solving;Executive functioning Arousal/Alertness: Lethargic (will open eyes intermittently) Orientation Level: Disoriented X4 Behavior During Session: Lethargic Attention - Other Comments: pt needs constant stim to focus on task Memory: Decreased recall of precautions Following Commands: Follows one step commands inconsistently Safety/Judgement: Decreased awareness of safety precautions;Decreased safety judgement for tasks assessed Awareness of Errors: Assistance required to identify errors made Cognition - Other Comments: Pt prefers to keep eyes closed, but then will interject into conversation.  She does not intiate movement but will follow with manual guidance    Extremity/Trunk Assessment Right Lower Extremity Assessment RLE ROM/Strength/Tone: Deficits RLE ROM/Strength/Tone Deficits: pt unable to completely straighten knee and pt appears to have some swelling around  the joint.  Pt indicates pain with attempted ROM Left Lower Extremity Assessment LLE ROM/Strength/Tone: WFL for tasks assessed Trunk Assessment Trunk Assessment: Other exceptions Trunk Exceptions: diffuse muscle atrophy   Balance Balance Balance Assessed: Yes Static Sitting Balance Static Sitting - Balance Support: Bilateral upper extremity supported Static Sitting - Level of Assistance: 3: Mod assist  End of Session PT - End of Session Equipment Utilized During Treatment: Gait belt Activity Tolerance: Patient tolerated treatment well Patient left: in chair;with family/visitor present  GP    Rosey Bath K. Briny Breezes, [place] [phone] 08/14/2012, 3:13 PM

## 2012-08-14 NOTE — Progress Notes (Signed)
Utilization review completed.  

## 2012-08-14 NOTE — Progress Notes (Addendum)
Thank you for consulting the Palliative Medicine Team at [facility] to meet your patient's and family's needs.   The reason that you asked [place] to see your patient is for goals of care discussion  We have scheduled your patient for a meeting: tomorrow, Wednesday 08/15/12 @ 8:30-9:00 am  The Surrogate decision make is: patient's husband/POA Loann Quill information: [phone]  Other family members that need to be present: patient's son, Gala Romney and daughter Neysa Bonito  Your patient is able/unable to participate: unable to fully participate because of dementia/AMS  Spoke with patient briefly at bedside who answered questions with words that could be understood but were unrelated to question asked; daughter Neysa Bonito at bedside stated a meeting tomorrow would be best for patient's husband -meeting time confirmed as above     Valente David, RN 08/14/2012, 1:46 PM Palliative Medicine Team RN Liaison [phone]

## 2012-08-14 NOTE — Progress Notes (Signed)
[facility] PROGRESS NOTE Interim History: 79 y.o. female with past medical history of anxiety, depression and dementia. Patient has previous evaluation to [facility]. Patient brought to the hospital because of generalized weakness and altered mental status. Patient is poor historian because of dementia, the history was obtained from her son who does not live with her. Son said patient functional and overall health status is declining for the past few month, and for the past one week she is getting very weak, poor appetite and she doesn't want to eat or drink. She got very weak to the point she couldn't walk and they brought her to the hospital today for further evaluation. Upon initial evaluation in the emergency department, CT scan of the head showed no abnormalities, urinalysis consistent with UTI, BMP is consistent with dehydration and hypokalemia    Assessment/Plan:  Acute metabolic encephalopathy/UTI (lower urinary tract infection) - rocephin 3.10.2014, BC and UC pending - afebrile overnight, leukocytosis resolved. - family will like to meet with hospice and palliative care medicine. - cannot go back home cannot take care of her, seem she has advance dementia.   Dementia - At that when necessary for agitation - Continue psychotropic medications. - sleeps about 80% of the time at home this is her baseline.    HYPERTENSION: - Continue meds   Code Status: assumed to be full code  Family Communication: son is at bedside.  Disposition Plan: 70 minutes, anticipate the length of the stay to be more than 2 midnights    Consultants:  palliative  Procedures:  none  Antibiotics:  Rocephin 3.10.2014  HPI/Subjective: lethargic  Objective: Filed Vitals:   08/13/12 1641 08/13/12 2049 08/14/12 0510 08/14/12 0820  BP: 134/66 112/46 141/60 133/49  Pulse: 92 84 93 65  Temp: 99.6 F (37.6 C) 97.4 F (36.3 C) 98.1 F (36.7 C) 99 F (37.2 C)   TempSrc:  Oral Oral Oral  Resp: 20 20 20 20   Height: 5\' 4"  (1.626 m) 5\' 4"  (1.626 m)    Weight: 68.7 kg (151 lb 7.3 oz) 68.7 kg (151 lb 7.3 oz)    SpO2: 96% 98% 97% 98%    Intake/Output Summary (Last 24 hours) at 08/14/12 1023 Last data filed at 08/14/12 0612  Gross per 24 hour  Intake 1683.33 ml  Output    650 ml  Net 1033.33 ml   Filed Weights   08/13/12 1641 08/13/12 2049  Weight: 68.7 kg (151 lb 7.3 oz) 68.7 kg (151 lb 7.3 oz)    Exam:  General: lethargic in no acute distress.  HEENT: No bruits, no goiter.  Heart: Regular rate and rhythm, without murmurs, rubs, gallops.  Lungs: Good air movement, bilateral air movement.  Abdomen: Soft, nontender, nondistended, positive bowel sounds.     Data Reviewed: Basic Metabolic Panel:  Recent Labs Lab 08/13/12 1052 08/14/12 0450  NA 146* 145  K 2.9* 4.5  CL 106 113*  CO2 29 26  GLUCOSE 122* 93  BUN 33* 31*  CREATININE 0.76 0.67  CALCIUM 9.3 8.4   Liver Function Tests:  Recent Labs Lab 08/13/12 1052  AST 61*  ALT 53*  ALKPHOS 97  BILITOT 0.4  PROT 6.9  ALBUMIN 2.5*   No results found for this basename: LIPASE, AMYLASE,  in the last 168 hours No results found for this basename: AMMONIA,  in the last 168 hours CBC:  Recent Labs Lab 08/13/12 1052 08/14/12 0450  WBC 13.6* 10.5  NEUTROABS 10.3*  --  HGB 13.4 12.4  HCT 39.1 37.2  MCV 90.5 92.5  PLT 217 197   Cardiac Enzymes: No results found for this basename: CKTOTAL, CKMB, CKMBINDEX, TROPONINI,  in the last 168 hours BNP (last 3 results) No results found for this basename: PROBNP,  in the last 8760 hours CBG: No results found for this basename: GLUCAP,  in the last 168 hours  Recent Results (from the past 240 hour(s))  URINE CULTURE     Status: None   Collection Time    08/13/12 12:25 PM      Result Value Range Status   Specimen Description URINE, CATHETERIZED   Final   Special Requests NONE   Final   Culture  Setup Time 08/13/2012 14:13    Final   Colony Count >=100,000 COLONIES/ML   Final   Culture ESCHERICHIA COLI   Final   Report Status PENDING   Incomplete     Studies: Dg Chest 2 View  08/13/2012  *RADIOLOGY REPORT*  Clinical Data: Fatigue, altered mental status.  CHEST - 2 VIEW  Comparison: November 22, 2011.  Findings: Cardiomediastinal silhouette appears normal.  No acute pulmonary disease is noted.  Bony thorax is intact.  IMPRESSION: No acute cardiopulmonary abnormality seen.   Original Report Authenticated By: Lupita Raider.,  M.D.    Ct Head Wo Contrast  08/13/2012  *RADIOLOGY REPORT*  Clinical Data: Fatigue, altered mental status, weakness, some right facial droop  CT HEAD WITHOUT CONTRAST  Technique:  Contiguous axial images were obtained from the base of the skull through the vertex without contrast.  Comparison: MR brain scan of 07/01/2010  Findings: The ventricular system is prominent as are the cortical sulci, indicative of diffuse atrophy.  The septum is midline in position.  Moderate small vessel ischemic change is noted throughout the periventricular white matter.  No hemorrhage, mass lesion, or acute infarction is seen.  No calvarial abnormality is seen.  The paranasal sinuses are well pneumatized.  IMPRESSION: Atrophy and moderate small vessel ischemic change.  No acute intracranial abnormality.   Original Report Authenticated By: Dwyane Dee, M.D.     Scheduled Meds: . antiseptic oral rinse  15 mL Mouth Rinse BID  . buPROPion  150 mg Oral Daily  . cefTRIAXone (ROCEPHIN)  IV  1 g Intravenous Q24H  . divalproex  500 mg Oral Daily  . donepezil  10 mg Oral QHS  . heparin  5,000 Units Subcutaneous Q8H  . memantine  5 mg Oral BID  . omega-3 acid ethyl esters  1 g Oral BID  . pantoprazole  40 mg Oral Daily  . risperiDONE  0.5 mg Oral BID  . sertraline  100 mg Oral Daily  . sertraline  50 mg Oral QHS  . simvastatin  40 mg Oral QPM  . sodium chloride  3 mL Intravenous Q12H  . topiramate  25 mg Oral Daily    Continuous Infusions: . sodium chloride 100 mL/hr at 08/14/12 0911     Marinda Elk  [facility] Pager [phone]. If 8PM-8AM, please contact night-coverage at [email or web address], password [facility] 08/14/2012, 10:23 AM  LOS: 1 day

## 2012-08-15 DIAGNOSIS — K5900 Constipation, unspecified: Secondary | ICD-10-CM

## 2012-08-15 DIAGNOSIS — R1310 Dysphagia, unspecified: Secondary | ICD-10-CM

## 2012-08-15 DIAGNOSIS — R627 Adult failure to thrive: Secondary | ICD-10-CM

## 2012-08-15 DIAGNOSIS — K219 Gastro-esophageal reflux disease without esophagitis: Secondary | ICD-10-CM

## 2012-08-15 DIAGNOSIS — G43009 Migraine without aura, not intractable, without status migrainosus: Secondary | ICD-10-CM

## 2012-08-15 LAB — URINE CULTURE: Colony Count: >=100000

## 2012-08-15 MED ORDER — SENNOSIDES-DOCUSATE SODIUM 8.6-50 MG PO TABS
1.0000 | ORAL_TABLET | Freq: Every day | ORAL | Status: DC
  Administered 2012-08-15 – 2012-08-19 (×5): 1 via ORAL
  Filled 2012-08-15 (×6): qty 1

## 2012-08-15 MED ORDER — BISACODYL 10 MG RE SUPP
10.0000 mg | Freq: Every day | RECTAL | Status: DC | PRN
  Filled 2012-08-15: qty 1

## 2012-08-15 NOTE — Progress Notes (Signed)
Patient [id number] Brandy [name]      DOB: [birth year]      HKV:[id number]  Palliative care met with patients family this morning, opted for DNR status, we discussed progressive nature of dementia. Elicited current Goals of Care based on patients status, ordered SLP to evaluate patients current swallowing capabilities, patient noted to have weak cough while eating breakfast. Also husband indicated she had not had bowel movement since last Tuesday or Wednesday, no BM documented since admission. Ordered Dulcolax suppository, and also Senakot-S at HS daily.  MOST form completed, family at this time wants patient discharged to SNF with Palliative Care services to follow. Verbalized their desire to work closely with Social Work on selection of available facilities.   Full palliative care note to follow.   Freddie Breech, CNS-C Palliative Medicine Team [facility] Team Phone: [phone] Pager: [phone]

## 2012-08-15 NOTE — Progress Notes (Signed)
Order received, chart reviewed, noted plan is for pt to go to SNF, will defer OT eval to that facilty since acute OT eval is not need for SNF admission when pt's insurance is standard Medicare.  Ignacia Palma, OTR/L [phone] 08/15/2012

## 2012-08-15 NOTE — Clinical Social Work Note (Signed)
CSW conducted assessment with patient husband and family who have decided on SNF placement with Palliative services following. Patient information sent out to [facility] in [facility]. Full assessment to follow.  Genelle Bal, MSW, LCSW [phone]

## 2012-08-15 NOTE — Evaluation (Addendum)
Clinical/Bedside Swallow Evaluation Patient Details  Name: Brandy [name] MRN: [id number] Date of Birth: 10-17-1929  Today's Date: 08/15/2012 Time: 4098-1191 SLP Time Calculation (min): 30 min  Past Medical History:  Past Medical History  Diagnosis Date  . Anxiety   . Depression   . Dementia     neuro eval WNL, normal MRI, eval at [facility] after admission with dx mild-mod dementia  . HTN (hypertension)   . HLD (hyperlipidemia)   . Allergic rhinitis   . OSA (obstructive sleep apnea)     intermittent CPAP use  . Migraine   . Arthritis   . Herpes zoster 12/29/2006  . Urinary incontinence    Past Surgical History:  Past Surgical History  Procedure Laterality Date  . Appendectomy    . Neck surgery      CALCIUM DEPOSITS  . Abdominal hysterectomy      TAH for fibroids   HPI:  Brandy [name] is a 79 y.o. female with past medical history of anxiety, depression and dementia. Patient has previous evaluation to [facility]. Patient brought to the hospital because of generalized weakness and altered mental status. Patient is poor historian because of dementia, the history was obtained from her son who does not live with her. Son said patient functional and overall health status is declining for the past few month, and for the past one week she is getting very weak, poor appetite and she doesn't want to eat or drink. She got very weak to the point she couldn't walk and they brought her to the hospital today for further evaluation. Upon initial evaluation in the emergency department, CT scan of the head showed no abnormalities, urinalysis consistent with UTI, BMP is consistent with dehydration and hypokalemia.  Family has met with Palliative Care; at this time wants patient discharged to SNF with Palliative Care services to follow. Clinical swallow eval ordered to determine current swallowing status.     Assessment / Plan / Recommendation Clinical  Impression  Pt participated in limited swallow assessment.  Eyes closed; stimulable for verbal yes/no responses, but not following commands.  Pt accepted straw presented and withdrew limited water from cup - this elicited immediate, explosive coughing, but did not cause pt to open her eyes or improve overall responsiveness.    Discussion with daughter focused on current status and whether pt may see improvements in level of alertness.  At this time, she is overtly aspirating, creating obvious discomfort.  If MS improves to baseline, daughter agrees that pt may enjoy careful hand-feeding of favorite soft food items.  If MS continues to wane, we discussed diminished desire for POs as a feature of advanced dementia and the importance of maintaining oral hygiene if able.  No further SLP intervention warranted - dtr in agreement.       Aspiration Risk  Severe    Diet Recommendation NPO given near-somnolence;  If MS improves to baseline, rec careful hand-feeding of soft items per pt's preferences.  Medication Administration: Crushed with puree Postural Changes and/or Swallow Maneuvers: Seated upright 90 degrees    Other  Recommendations Oral Care Recommendations: Oral care QID   Follow Up Recommendations  None         Swallow Study Prior Functional Status       General Date of Onset: 08/13/12 HPI: Brandy [name] is a 79 y.o. female with past medical history of anxiety, depression and dementia. Patient has previous evaluation to [facility]. Patient  brought to the hospital because of generalized weakness and altered mental status. Patient is poor historian because of dementia, the history was obtained from her son who does not live with her. Son said patient functional and overall health status is declining for the past few month, and for the past one week she is getting very weak, poor appetite and she doesn't want to eat or drink. She got very weak to the point she  couldn't walk and they brought her to the hospital today for further evaluation. Upon initial evaluation in the emergency department, CT scan of the head showed no abnormalities, urinalysis consistent with UTI, BMP is consistent with dehydration and hypokalemia.  Family has met with Palliative Care; at this time wants patient discharged to SNF with Palliative Care services to follow. Clinical swallow eval ordered to determine current swallowing status.   Type of Study: Bedside swallow evaluation Diet Prior to this Study: Other (Comment) (soft) Temperature Spikes Noted: No Respiratory Status: Supplemental O2 delivered via (comment) Behavior/Cognition: Lethargic Self-Feeding Abilities: Total assist Patient Positioning: Upright in bed Baseline Vocal Quality: Clear Volitional Cough: Cognitively unable to elicit Volitional Swallow: Unable to elicit    Oral/Motor/Sensory Function Overall Oral Motor/Sensory Function: Other (comment) (unable to adequately assess) symmetric facial features at rest  Ice Chips Ice chips: Not tested   Thin Liquid Thin Liquid: Impaired Presentation: Straw Oral Phase Impairments: Reduced labial seal Oral Phase Functional Implications: Right anterior spillage Pharyngeal  Phase Impairments: Multiple swallows;Cough - Immediate/explosive cough               Amanda L. Samson Frederic, [place] CCC/SLP Pager [phone]  Brandy [name] 08/15/2012,5:26 PM

## 2012-08-15 NOTE — Progress Notes (Signed)
[facility] PROGRESS NOTE Interim History: 79 y.o. female with past medical history of anxiety, depression and dementia. Patient has previous evaluation to [facility]. Patient brought to the hospital because of generalized weakness and altered mental status. Patient is poor historian because of dementia, the history was obtained from her son who does not live with her. Son said patient functional and overall health status is declining for the past few month, and for the past one week she is getting very weak, poor appetite and she doesn't want to eat or drink. She got very weak to the point she couldn't walk and they brought her to the hospital today for further evaluation. Upon initial evaluation in the emergency department, CT scan of the head showed no abnormalities, urinalysis consistent with UTI, BMP is consistent with dehydration and hypokalemia    Assessment/Plan:  Acute metabolic encephalopathy due to E.coli UT - slowly improving and reaching baseline according to family members. -continue rocephin sensitivity demonstrated that microorganism is pan-sensitive; will change to ceftin at discharge. - afebrile overnight, leukocytosis resolved. - family met with will palliative care medicine for goals of care; reviewed PC note for details (but essentially DNR/DNI and will like to go to SNF with PC following). - cannot go back home, family cannot take care of her, seem she has advance dementia.  E. Coli UTI: as mentioned above. Will treat for 3 days with IV antibiotics and switch to ceftin at discharge to finish Ab'x therapy. Microorganism is pan-sensitive.  Dementia - At BASELINE PER FAMILY - Continue psychotropic medications. - sleeps about 80% of the time at home this is her baseline.  HYPERTENSION: - Continue meds -BP fairly well controlled  HLD: continue statins.  Hx of migraines: will continue topamax.  GERD: continue PPI.  DVT: heparin  Code  Status: DNR/DNI Family Communication: son is at bedside.  Disposition Plan: plan is to discharge to SNF with PC following  Consultants:  palliative  Procedures:  none  Antibiotics:  Rocephin 3.10.2014  HPI/Subjective: Lethargic, but easily arousable and per son at bedside has been more appropriate when communicating today.  Objective: Filed Vitals:   08/15/12 0800 08/15/12 1400 08/15/12 1751 08/15/12 2055  BP: 154/79 161/82 131/56 116/49  Pulse: 86 79 85 70  Temp: 98.4 F (36.9 C) 98.1 F (36.7 C) 98.8 F (37.1 C) 99.5 F (37.5 C)  TempSrc:  Oral Oral Axillary  Resp: 20 20 20 20   Height:      Weight:      SpO2: 95% 96% 94% 98%    Intake/Output Summary (Last 24 hours) at 08/15/12 2206 Last data filed at 08/15/12 2101  Gross per 24 hour  Intake   1440 ml  Output   2452 ml  Net  -1012 ml   Filed Weights   08/13/12 1641 08/13/12 2049 08/14/12 2043  Weight: 68.7 kg (151 lb 7.3 oz) 68.7 kg (151 lb 7.3 oz) 68.7 kg (151 lb 7.3 oz)    Exam:  General: lethargic, but a little more appropriate when answering questions; in no acute distress.  HEENT: No bruits, no goiter.  Heart: Regular rate and rhythm, without murmurs, rubs, gallops.  Lungs: Good air movement, no crackles, no rhonchi Abdomen: Soft, nontender, nondistended, positive bowel sounds.    Data Reviewed: Basic Metabolic Panel:  Recent Labs Lab 08/13/12 1052 08/14/12 0450  NA 146* 145  K 2.9* 4.5  CL 106 113*  CO2 29 26  GLUCOSE 122* 93  BUN 33* 31*  CREATININE 0.76 0.67  CALCIUM 9.3 8.4   Liver Function Tests:  Recent Labs Lab 08/13/12 1052  AST 61*  ALT 53*  ALKPHOS 97  BILITOT 0.4  PROT 6.9  ALBUMIN 2.5*   CBC:  Recent Labs Lab 08/13/12 1052 08/14/12 0450  WBC 13.6* 10.5  NEUTROABS 10.3*  --   HGB 13.4 12.4  HCT 39.1 37.2  MCV 90.5 92.5  PLT 217 197    Recent Results (from the past 240 hour(s))  URINE CULTURE     Status: None   Collection Time    08/13/12 12:25 PM       Result Value Range Status   Specimen Description URINE, CATHETERIZED   Final   Special Requests NONE   Final   Culture  Setup Time 08/13/2012 14:13   Final   Colony Count >=100,000 COLONIES/ML   Final   Culture ESCHERICHIA COLI   Final   Report Status 08/15/2012 FINAL   Final   Organism ID, Bacteria ESCHERICHIA COLI   Final    Scheduled Meds: . antiseptic oral rinse  15 mL Mouth Rinse BID  . buPROPion  150 mg Oral Daily  . cefTRIAXone (ROCEPHIN)  IV  1 g Intravenous Q24H  . divalproex  500 mg Oral Daily  . donepezil  10 mg Oral QHS  . heparin  5,000 Units Subcutaneous Q8H  . memantine  5 mg Oral BID  . omega-3 acid ethyl esters  1 g Oral BID  . pantoprazole  40 mg Oral Daily  . risperiDONE  0.5 mg Oral BID  . senna-docusate  1 tablet Oral QHS  . sertraline  100 mg Oral Daily  . sertraline  50 mg Oral QHS  . simvastatin  40 mg Oral QPM  . sodium chloride  3 mL Intravenous Q12H  . topiramate  25 mg Oral Daily   Continuous Infusions: . sodium chloride 75 mL/hr at 08/15/12 0408     MADERA,CARLOS  [facility] Pager [phone]. If 8PM-8AM, please contact night-coverage at [email or web address], password [facility] 08/15/2012, 10:06 PM  LOS: 2 days

## 2012-08-15 NOTE — Consult Note (Signed)
Patient ZO:XWRUEAV [name]      DOB: 08/31/1929      [name]:[id number]     Consult Note from the Palliative Medicine Team at [facility]    Consult Requested by: Dr Robb Matar     PCP: Brandy Boyden, MD Reason for Consultation:Goals of Care     Phone [phone]  Assessment of patients Current state: Patient alert, simple verbal responses appropriate this morning, being fed by daughter, appetite good. Noted to have weak cough while eating, no overt choking episodes. Patient with definite cognitive debility, needs to have total assistance with ADL's.  Reviewed chart spoke with staff caring for patient, proceeded to have family Goals of Care conference with patients husband, Brandy [name], their daughter and son also present. Patient and husband lived with husbands 15 yo mother since this past June, due to patients continual decline in functional ability. Cognitive/functional decline began 3 years ago with subtle changes that have progressed to the point where patient was only able to ambulate in shuffling manner up until last week, communication has become progressively worse and it now limited to words or short phrases.  We discussed overall progression of patients dementia, and the anticipated expected continuing decline. Patient noted to have decline in oral food and fluid intake over past few weeks, in addition to increased generalized extremity weakness over past 4 weeks.   Discussed the philosophy of palliative care and hospice support and services provided. Also engaged in decision-making with family regarding concepts of Medical Orders for Scope of Treatment pertaining to cardiac and pulmonary resuscitation, desire for acute future medical interventions for issues, the use of antibiotic therapy, intravenous hydration and continuing artificial tube feedings.  Husband admitted that with patients declining functional status it would be impossible to care for her safely at home, and would be agreeable  to consider SNF placement.   Goals of Care: 1.  Code Status:DNR/DNI: would not want aggressive resuscitation measures implemented   At this point would want to purse re-hospitalizations for acute medical issues that would occur upon discharge, family understands that with progressive future decline focus on comfort care with hospice support would be appropriate.  2. Scope of Treatment: 1. Vital Signs:routine  2. Respiratory/Oxygen:support as indicated 3. Nutritional Support/Tube Feeds: No artificial tube feedings-agreeable to comfort feeding if pending SLP evaluation indicates risk for aspiration 4. Antibiotics: as indicated for treatable identified infections 5. IVF:for defined period of time 6. Review of Medications to be discontinued: none 7. Labs: as indicated 8. Telemetry: as indicated  9.   Consults: SLP evaluation   4. Disposition: Recommend discharge to SNF with Palliative Care services to follow   3. Symptom Management:   1. Anxiety/Agitation: Haldol ordered by primary team for agitation 2. Pain:Tylenol ordered for mild pain 3. Constipation: Dulcolax suppository ordered -requested staff to administer last BM 4-5 days ago- 4. Dysphagia: aspiration precautions-feed patient when alert  4. Psychosocial:Emotional support and eduction to family on progressive effects of dementia, need to re-evaluate goals of care and transition to comfort mode with hospice support as dysphagia progresses  5. Spiritual:consulted  Patient Documents Completed or Given: Document Given Completed  Advanced Directives Pkt    MOST  YES  DNR  YES  Gone from My Sight    Hard Choices None available     Brief HPI: Patient is 79 to WF, with progressive dementia, admitted 08/13/12 due to increased weakness and decreased oral food and fluid intake  ROS: unable to elicit full ROS, patient did deny presence  of pain    PMH:  Past Medical History  Diagnosis Date  . Anxiety   . Depression   .  Dementia     neuro eval WNL, normal MRI, [facility] with dx mild-mod dementia  . HTN (hypertension)   . HLD (hyperlipidemia)   . Allergic rhinitis   . OSA (obstructive sleep apnea)     intermittent CPAP use  . Migraine   . Arthritis   . Herpes zoster 12/29/2006  . Urinary incontinence      PSH: Past Surgical History  Procedure Laterality Date  . Appendectomy    . Neck surgery      CALCIUM DEPOSITS  . Abdominal hysterectomy      TAH for fibroids   I have reviewed the FH and SH and  If appropriate update it with new information. Allergies  Allergen Reactions  . Other     CRANBERRY JUICE  . Sulfonamide Derivatives     REACTION: Can't remember   Scheduled Meds: . antiseptic oral rinse  15 mL Mouth Rinse BID  . buPROPion  150 mg Oral Daily  . cefTRIAXone (ROCEPHIN)  IV  1 g Intravenous Q24H  . divalproex  500 mg Oral Daily  . donepezil  10 mg Oral QHS  . heparin  5,000 Units Subcutaneous Q8H  . memantine  5 mg Oral BID  . omega-3 acid ethyl esters  1 g Oral BID  . pantoprazole  40 mg Oral Daily  . risperiDONE  0.5 mg Oral BID  . senna-docusate  1 tablet Oral QHS  . sertraline  100 mg Oral Daily  . sertraline  50 mg Oral QHS  . simvastatin  40 mg Oral QPM  . sodium chloride  3 mL Intravenous Q12H  . topiramate  25 mg Oral Daily   Continuous Infusions: . sodium chloride 75 mL/hr at 08/15/12 0408   PRN Meds:.bisacodyl, haloperidol lactate    BP 131/56  Pulse 85  Temp(Src) 98.8 F (37.1 C) (Oral)  Resp 20  Ht 5\' 4"  (1.626 m)  Wt 68.7 kg (151 lb 7.3 oz)  BMI 25.98 kg/m2  SpO2 94%   PPS: 30%   Intake/Output Summary (Last 24 hours) at 08/15/12 1836 Last data filed at 08/15/12 1700  Gross per 24 hour  Intake   1560 ml  Output   1827 ml  Net   -267 ml   LBM:4-5 days ago, ordered Dulcolax   Suppository asked staff to administer today, and f/u with primary MD if uneffective                    Physical  Exam:  General: alert, with periods of lethargy, verbal responses limited to single words HEENT:  Anicteric, buccal mucosa moist free of lesions Chest:   CTA, diminished at bases CVS: RRR Abdomen: soft, BS audible, slight distention Ext: no pedal edema Neuro: oriented to self/family  Labs: CBC    Component Value Date/Time   WBC 10.5 08/14/2012 0450   RBC 4.02 08/14/2012 0450   HGB 12.4 08/14/2012 0450   HCT 37.2 08/14/2012 0450   PLT 197 08/14/2012 0450   MCV 92.5 08/14/2012 0450   MCH 30.8 08/14/2012 0450   MCHC 33.3 08/14/2012 0450   RDW 13.8 08/14/2012 0450   LYMPHSABS 1.1 08/13/2012 1052   MONOABS 2.1* 08/13/2012 1052   EOSABS 0.0 08/13/2012 1052   BASOSABS 0.0 08/13/2012 1052    BMET    Component Value Date/Time  NA 145 08/14/2012 0450   K 4.5 08/14/2012 0450   CL 113* 08/14/2012 0450   CO2 26 08/14/2012 0450   GLUCOSE 93 08/14/2012 0450   BUN 31* 08/14/2012 0450   CREATININE 0.67 08/14/2012 0450   CREATININE 0.73 04/27/2012 1710   CALCIUM 8.4 08/14/2012 0450   GFRNONAA 81* 08/14/2012 0450   GFRAA >90 08/14/2012 0450    CMP     Component Value Date/Time   NA 145 08/14/2012 0450   K 4.5 08/14/2012 0450   CL 113* 08/14/2012 0450   CO2 26 08/14/2012 0450   GLUCOSE 93 08/14/2012 0450   BUN 31* 08/14/2012 0450   CREATININE 0.67 08/14/2012 0450   CREATININE 0.73 04/27/2012 1710   CALCIUM 8.4 08/14/2012 0450   PROT 6.9 08/13/2012 1052   ALBUMIN 2.5* 08/13/2012 1052   AST 61* 08/13/2012 1052   ALT 53* 08/13/2012 1052   ALKPHOS 97 08/13/2012 1052   BILITOT 0.4 08/13/2012 1052   GFRNONAA 81* 08/14/2012 0450   GFRAA >90 08/14/2012 0450    Chest Xray Reviewed/Impressions:08/13/12 IMPRESSION:  No acute cardiopulmonary abnormality seen    Time In Time Out Total Time Spent with Patient Total Overall Time  8:30a 10:00a 20 min 90 min    Greater than 50%  of this time was spent counseling and coordinating care related to the above assessment and plan.  Freddie Breech,  CNS-C Palliative Medicine Team [facility] Team Phone: [phone] Pager: [phone]

## 2012-08-16 MED ORDER — CARBAMIDE PEROXIDE 6.5 % OT SOLN
5.0000 [drp] | Freq: Two times a day (BID) | OTIC | Status: AC
  Administered 2012-08-16 – 2012-08-19 (×7): 5 [drp] via OTIC
  Filled 2012-08-16: qty 15

## 2012-08-16 MED ORDER — BENZTROPINE MESYLATE 1 MG PO TABS
1.0000 mg | ORAL_TABLET | Freq: Every day | ORAL | Status: DC
Start: 2012-08-16 — End: 2012-08-20
  Administered 2012-08-16 – 2012-08-20 (×5): 1 mg via ORAL
  Filled 2012-08-16 (×5): qty 1

## 2012-08-16 MED ORDER — CEFUROXIME AXETIL 500 MG PO TABS
500.0000 mg | ORAL_TABLET | Freq: Two times a day (BID) | ORAL | Status: DC
  Administered 2012-08-17 – 2012-08-20 (×8): 500 mg via ORAL
  Filled 2012-08-16 (×9): qty 1

## 2012-08-16 NOTE — Progress Notes (Signed)
Physical Therapy Treatment Patient Details Name: Brandy [name] MRN: [id number] DOB: 01-Oct-1929 Today's Date: 08/16/2012 Time: 4098-1191 PT Time Calculation (min): 18 min  PT Assessment / Plan / Recommendation Comments on Treatment Session  Pt noted to have increased Upper extremity edema today.  Attempted bed mobility and sitting balance, but pt requiring total assist. Recommend nursing use maximove for transfer out of bed daily    Follow Up Recommendations  SNF     Does the patient have the potential to tolerate intense rehabilitation     Barriers to Discharge        Equipment Recommendations       Recommendations for Other Services    Frequency     Plan Discharge plan remains appropriate;Frequency remains appropriate    Precautions / Restrictions     Pertinent Vitals/Pain Pt cries out in pain is she is moved too suddenly    Mobility  Bed Mobility Bed Mobility: Rolling Right;Right Sidelying to Sit Rolling Right: 1: +1 Total assist Rolling Left: Patient Percentage: 0% Right Sidelying to Sit: 1: +1 Total assist;HOB elevated Left Sidelying to Sit: Patient Percentage: 0% Details for Bed Mobility Assistance: pt did not assist with movement Ambulation/Gait Ambulation/Gait Assistance: Not tested (comment) Stairs: No Wheelchair Mobility Wheelchair Mobility: No    Exercises General Exercises - Lower Extremity Ankle Circles/Pumps: AAROM;Both;10 reps;Supine Hip ABduction/ADduction: AAROM;Both;5 reps;Supine Hip Flexion/Marching: AAROM;Both;5 reps;Supine   PT Diagnosis:    PT Problem List:   PT Treatment Interventions:     PT Goals Acute Rehab PT Goals PT Goal Formulation: With family Time For Goal Achievement: 08/28/12 Potential to Achieve Goals: Fair Pt will go Supine/Side to Sit: with min assist PT Goal: Supine/Side to Sit - Progress: Not progressing Pt will go Sit to Supine/Side: with min assist PT Goal: Sit to Supine/Side - Progress: Not progressing Pt will  Transfer Bed to Chair/Chair to Bed: with min assist  Visit Information  Last PT Received On: 08/16/12 Assistance Needed: +2    Subjective Data  Subjective: pt keeps eyes closed most of time, but occasionally will speak appropriately to conversation Patient Stated Goal: none stated   Cognition  Cognition Overall Cognitive Status:  (son reports progressive decline) Area of Impairment: Attention;Memory;Following commands;Safety/judgement;Awareness of errors;Awareness of deficits;Problem solving;Executive functioning Arousal/Alertness: Lethargic (will open eyes intermittently) Orientation Level: Disoriented X4 Behavior During Session: Lethargic Attention - Other Comments: pt needs constant stim to focus on task Memory: Decreased recall of precautions Following Commands: Follows one step commands inconsistently Safety/Judgement: Decreased awareness of safety precautions;Decreased safety judgement for tasks assessed Awareness of Errors: Assistance required to identify errors made Cognition - Other Comments: Pt prefers to keep eyes closed, but then will interject into conversation.  She does not intiate movement but will follow with manual guidance    Balance  Balance Balance Assessed: Yes Static Sitting Balance Static Sitting - Balance Support: Bilateral upper extremity supported Static Sitting - Level of Assistance: 2: Max assist Static Sitting - Comment/# of Minutes: worked on sitting on EBO for > 7 minutes.  Pt with marked leaning toward right and would only occasionally work to maintain self in midline. She usually sat in flexed position with head down.  End of Session PT - End of Session Equipment Utilized During Treatment: Oxygen Activity Tolerance: Patient limited by fatigue Patient left: with family/visitor present   GP     Donnetta Hail 08/16/2012, 3:10 PM

## 2012-08-16 NOTE — Clinical Social Work Psychosocial (Addendum)
Clinical Social Work Department BRIEF PSYCHOSOCIAL ASSESSMENT 08/16/2012  Patient:  Brandy [name], Brandy [name]     Account Number:  [id number]     Admit date:  08/13/2012  Clinical Social Worker:  Delmer Islam  Date/Time:  08/16/2012 02:50 AM  Referred by:  Physician  Date Referred:  08/15/2012 Referred for  SNF Placement   Other Referral:   Interview type:  Family Other interview type:   CSW talked with patient's husband and 2 adult children    PSYCHOSOCIAL DATA Living Status:  HUSBAND Admitted from facility:   Level of care:   Primary support name:  Elisa Kutner Primary support relationship to patient:  SPOUSE Degree of support available:   strong support    CURRENT CONCERNS Current Concerns  Post-Acute Placement   Other Concerns:    SOCIAL WORK ASSESSMENT / PLAN On 08/15/12 CSW talked with patient's husband (primarily) and adult children regarding SNF placement and they understand that patient needs rehab and are in agreement. Mr. Nilan was given the SNF list for [facility]. Husband indicated that he may want a facility in [place] as it will more convenient for him and their children.    CSW explained the SNF search process and advised that I would be following up with bed offers.   Assessment/plan status:  Psychosocial Support/Ongoing Assessment of Needs Other assessment/ plan:   Information/referral to community resources:   Husband given SNF list for [facility] on 08/15/12    PATIENT'S/FAMILY'S RESPONSE TO PLAN OF CARE: Family appreciative of CSW assistance with SNF placement

## 2012-08-16 NOTE — Clinical Social Work Note (Addendum)
CSW intern spoke with patient's husband and daughter to provide bed offers. Husband and daughter was visibly angry due to the decline from [facility]. Husband expressed his frustration about grieving the lost of his son and the amount of stress on him to find placement for his wife. Family informed CSW intern that they would visit [facility] (other bed offer) and would also follow up with [facility] regarding placement.   Later same date husband informed CSW intern that [facility] is sending someone from the facility to evaluate her for placement. CSW intern then received a call from Tammie at [facility] regarding patient evaluation. CSW made call to facility at 3:21pm to follow up regarding evaluation and message left.   Fernande Boyden, Social Work Intern 08/16/2012   Reviewed and co-signed: Genelle Bal, LCSW   08/17/12 - FOLLOW-UP NOTE: - CSW contacted Tammie in admissions at [facility] Friday morning to determine if someone had come out to evaluate patient and their decision. CSW advised that a staff person did not come out and their decision remains to not accept patient based on her dementia and behaviors. Alvina Chou, LCSW

## 2012-08-16 NOTE — Clinical Social Work Placement (Addendum)
Clinical Social Work Department CLINICAL SOCIAL WORK PLACEMENT NOTE 08/16/2012  Patient:  Brandy [name], Brandy [name]  Account Number:  [id number] Admit date:  08/13/2012  Clinical Social Worker:  Genelle Bal, LCSW  Date/time:  08/16/2012 03:23 AM  Clinical Social Work is seeking post-discharge placement for this patient at the following level of care:   SKILLED NURSING   (*CSW will update this form in Epic as items are completed)   08/15/2012  Patient/family provided with [facility] Department of Clinical Social Work's list of facilities offering this level of care within the geographic area requested by the patient (or if unable, by the patient's family).  08/15/2012  Patient/family informed of their freedom to choose among providers that offer the needed level of care, that participate in Medicare, Medicaid or managed care program needed by the patient, have an available bed and are willing to accept the patient.    Patient/family informed of [facility]' ownership interest in [facility], as well as of the fact that they are under no obligation to receive care at this facility.  PASARR submitted to EDS on 08/15/2012 PASARR number received from [employer] on 08/16/2012  FL2 transmitted to all facilities in geographic area requested by pt/family on  08/15/2012 FL2 transmitted to all facilities within larger geographic area on   Patient informed that his/her managed care company has contracts with or will negotiate with  certain facilities, including the following:     Patient/family informed of bed offers received:  08/16/2012 Patient chooses bed at [facility] Physician recommends and patient chooses bed at    Patient to be transferred to [facility] on 08/20/12   Patient to be transferred to facility by ambulance  The following physician request were entered in Epic:   Additional Comments: 08/20/12 - CSW talked with family in the room and informed that they will be advised when  ambulance called.

## 2012-08-16 NOTE — Progress Notes (Signed)
Stopped by to visit with pt but she was not available.  Will follow up tomorrow.  Rutherford Nail Chaplain

## 2012-08-16 NOTE — Progress Notes (Signed)
[facility] PROGRESS NOTE Assessment/Plan:  Acute metabolic encephalopathy due to E.coli UTI - slowly improving and reaching baseline according to family members. -change antibiotics to ceftin to complete treatment of her UTI. - Patient continue to be afebrile and leukocytosis resolved. - family met with palliative care medicine for goals of care; reviewed PC note for details (but essentially DNR/DNI and will like to go to SNF with PC following). - cannot go back home, family cannot take care of her, seem she has advance dementia.  E. Coli UTI: as mentioned above. We have completed 3 days of IV antibiotics and will now switch to ceftin to finish Ab'x therapy. Microorganism is pan-sensitive.  Dementia - At BASELINE PER FAMILY - Continue psychotropic medications. - sleeps about 80% of the time at home this is her baseline. -will add cogentin for acathisia and muscle spasm due to use of psychotropic drugs.  HYPERTENSION: -Continue meds -BP fairly well controlled  HLD: continue statins.  Hx of migraines: will continue topamax.  GERD: continue PPI.  Left ear pain: no infection appreciated; significant wax accumulation; will start peroxide otic drops for relief   DVT: heparin  Code Status: DNR/DNI Family Communication: son is at bedside.  Disposition Plan: plan is to discharge to SNF with PC following  Consultants:  palliative  Procedures:  none  Antibiotics:  Rocephin 3.10.2014  HPI/Subjective: Lethargic, but easily arousable and per son at bedside has been more appropriate when communicating today.  Objective: Filed Vitals:   08/15/12 1400 08/15/12 1751 08/15/12 2055 08/16/12 0810  BP: 161/82 131/56 116/49 133/69  Pulse: 79 85 70 70  Temp: 98.1 F (36.7 C) 98.8 F (37.1 C) 99.5 F (37.5 C) 98.8 F (37.1 C)  TempSrc: Oral Oral Axillary Oral  Resp: 20 20 20 20   Height:      Weight:      SpO2: 96% 94% 98% 98%    Intake/Output Summary (Last 24  hours) at 08/16/12 1611 Last data filed at 08/16/12 0502  Gross per 24 hour  Intake    120 ml  Output   2000 ml  Net  -1880 ml   Filed Weights   08/13/12 1641 08/13/12 2049 08/14/12 2043  Weight: 68.7 kg (151 lb 7.3 oz) 68.7 kg (151 lb 7.3 oz) 68.7 kg (151 lb 7.3 oz)    Exam:  General: lethargic, but a little more appropriate when answering questions; in no acute distress.  HEENT: No bruits, no goiter.  Heart: Regular rate and rhythm, without murmurs, rubs, gallops.  Lungs: Good air movement, no crackles, no rhonchi Abdomen: Soft, nontender, nondistended, positive bowel sounds.    Data Reviewed: Basic Metabolic Panel:  Recent Labs Lab 08/13/12 1052 08/14/12 0450  NA 146* 145  K 2.9* 4.5  CL 106 113*  CO2 29 26  GLUCOSE 122* 93  BUN 33* 31*  CREATININE 0.76 0.67  CALCIUM 9.3 8.4   Liver Function Tests:  Recent Labs Lab 08/13/12 1052  AST 61*  ALT 53*  ALKPHOS 97  BILITOT 0.4  PROT 6.9  ALBUMIN 2.5*   CBC:  Recent Labs Lab 08/13/12 1052 08/14/12 0450  WBC 13.6* 10.5  NEUTROABS 10.3*  --   HGB 13.4 12.4  HCT 39.1 37.2  MCV 90.5 92.5  PLT 217 197    Recent Results (from the past 240 hour(s))  URINE CULTURE     Status: None   Collection Time    08/13/12 12:25 PM      Result Value Range Status  Specimen Description URINE, CATHETERIZED   Final   Special Requests NONE   Final   Culture  Setup Time 08/13/2012 14:13   Final   Colony Count >=100,000 COLONIES/ML   Final   Culture ESCHERICHIA COLI   Final   Report Status 08/15/2012 FINAL   Final   Organism ID, Bacteria ESCHERICHIA COLI   Final    Scheduled Meds: . antiseptic oral rinse  15 mL Mouth Rinse BID  . benztropine  1 mg Oral Daily  . buPROPion  150 mg Oral Daily  . carbamide peroxide  5 drop Left Ear BID  . cefTRIAXone (ROCEPHIN)  IV  1 g Intravenous Q24H  . [START ON 08/17/2012] cefUROXime  500 mg Oral BID WC  . divalproex  500 mg Oral Daily  . donepezil  10 mg Oral QHS  . heparin   5,000 Units Subcutaneous Q8H  . memantine  5 mg Oral BID  . omega-3 acid ethyl esters  1 g Oral BID  . pantoprazole  40 mg Oral Daily  . risperiDONE  0.5 mg Oral BID  . senna-docusate  1 tablet Oral QHS  . sertraline  100 mg Oral Daily  . sertraline  50 mg Oral QHS  . simvastatin  40 mg Oral QPM  . sodium chloride  3 mL Intravenous Q12H  . topiramate  25 mg Oral Daily   Continuous Infusions: . sodium chloride 75 mL/hr at 08/15/12 0408     Brandy [name],Brandy [name]  [facility] Pager [phone]. If 8PM-8AM, please contact night-coverage at [email or web address], password [facility] 08/16/2012, 4:11 PM  LOS: 3 days

## 2012-08-17 DIAGNOSIS — I10 Essential (primary) hypertension: Secondary | ICD-10-CM

## 2012-08-17 NOTE — Progress Notes (Addendum)
Palliative medicine team got a phone call from the patient's daughter, with concerns regarding disposition. Met with patient's daughter and her husband and discussed the disposition options. At this time patient has been accepted but [facility] skilled [facility] facility for rehabilitation. Patient's daughter is concerned that she might be discharged soon from the [facility] home and they will not able to take care of her as patient has dementia and has declined over past few weeks. Discussed the home hospice option with family, and they seem to be agreeable at this time.  Filed Vitals:   08/17/12 1053  BP: 124/55  Pulse: 76  Temp: 98.4 F (36.9 C)  Resp: 18   They would like the patient to go to rehabilitation and see how much she improves at [facility] home and then they would consider taking her home with home hospice. The plan was discussed with the social worker at the floor along with the attending physician Dr. Vassie Loll.

## 2012-08-17 NOTE — Clinical Social Work Note (Signed)
CSW talked with family (husband and daughter) today regarding patient discharge and they had chosen [facility] and talked with admissions director Tresa Endo on 3/13. CSW contacted Tresa Endo to advise patient ready for discharge and was advised that the 2 available room were taken. Tresa Endo and family had discussed patient being LTC after rehab, their desire for patient to be at [facility] for LTC and their intent to apply for Medicaid. Tresa Endo aware that patient a poor candidate for ST rehab and would then need a LTC Medicaid bed, and she indicated that they have a 2/3 year waiting list for Medicaid LTC beds and cannot accept patient at this time.  Family made aware by CSW and were understandably very upset. The family is dealing with not getting the facilities they want [facility] and [facility], as well as the death of patient's son this week) Mr. Haydon was informed that Tresa Endo would call him to explain further. CSW and family had a lengthy discussion regarding their options and CSW explained that their choices were [facility] skilled nursing facility or home. Daughter requested to speak with the top person at [facility] and calls were made by charge nurse to administration. CSW also contacted CSW Assistant Director Juanda Bond and explained situation. A meeting was eventually scheduled with nursing administrator Aurea Graff and Wandra Mannan with family at 1:00 pm. After the meeting the family did decide to tour [facility]. CSW was also asked to contact [facility] and talk with admissions to determine if they would consider reviewing additional clinicals for possible admission to their facility. CSW called facility several times and left 3 voice mails but no return call from [facility].  CSW advised later by family that they would accept [facility] (between 3:30-4 pm). Call made to [facility] in admissions at [facility] regarding patient and they can accept her on Monday, 3/17. Per Marylene Land, family is aware of discharge date. Aurea Graff,  nursing administrator and Wandra Mannan, CSW Chiropodist advised.

## 2012-08-17 NOTE — Progress Notes (Signed)
[facility] PROGRESS NOTE Assessment/Plan:  Acute metabolic encephalopathy due to E.coli UTI - slowly improving and reaching baseline according to family members. -change antibiotics to ceftin to complete treatment of her UTI. - Patient continue to be afebrile and leukocytosis resolved. - family met with palliative care medicine for goals of care; reviewed PC note for details (but essentially DNR/DNI and will like to go to SNF with PC following). - cannot go back home, family cannot take care of her, seem she has advance dementia. -Plan is for patient to be discharge on Monday, 08/20/12 to SNF [facility]). Will follow and address any changes in her status.  E. Coli UTI: as mentioned above. We have completed 3 days of IV antibiotics and will now switch to ceftin to finish Ab'x therapy. Microorganism is pan-sensitive.  Dementia - At BASELINE PER FAMILY - Continue psychotropic medications. - good response to cogentin for acathisia and muscle spasm due to use of psychotropic drugs. Will continue  HYPERTENSION: -Continue meds -BP well controlled  HLD: continue statins.  Hx of migraines: will continue topamax.  GERD: continue PPI.  Left ear pain: no infection appreciated. -good response to otic perodixe use; patient reports not further discomfort; will continue tx for a total of 3 days.   DVT: heparin  Code Status: DNR/DNI Family Communication: son is at bedside.  Disposition Plan: plan is to discharge to SNF with PC following  Consultants:  palliative  Procedures:  See below for x-ray reports  Antibiotics:  Rocephin 3.10.2014-08/16/12  ceftin 08/17/12  HPI/Subjective: Afebrile, no Pain, comfortable; denies left ear discomfort today and was able to eat breakfast w/o problem.  Objective: Filed Vitals:   08/17/12 0507 08/17/12 1053 08/17/12 1344 08/17/12 2109  BP: 123/55 124/55 117/47 138/45  Pulse: 77 76 79 80  Temp: 98 F (36.7 C) 98.4 F (36.9 C) 98.8 F  (37.1 C) 98.2 F (36.8 C)  TempSrc: Axillary Oral Oral Oral  Resp: 16 18 18 20   Height:      Weight:      SpO2: 99% 96% 94% 94%    Intake/Output Summary (Last 24 hours) at 08/17/12 2151 Last data filed at 08/17/12 2110  Gross per 24 hour  Intake     20 ml  Output   2250 ml  Net  -2230 ml   Filed Weights   08/13/12 1641 08/13/12 2049 08/14/12 2043  Weight: 68.7 kg (151 lb 7.3 oz) 68.7 kg (151 lb 7.3 oz) 68.7 kg (151 lb 7.3 oz)    Exam:  General: alert, oriented X1; communicative and appropriate; NAD and no aggressive.lethargic.  HEENT: No bruits, no goiter.  Heart: Regular rate and rhythm, without murmurs, rubs, gallops.  Lungs: Good air movement, no crackles, no rhonchi Abdomen: Soft, nontender, nondistended, positive bowel sounds.  Neuro: non focal deficit appreciated. Slight rigidity most likely from psychotropic drugs.   Data Reviewed: Basic Metabolic Panel:  Recent Labs Lab 08/13/12 1052 08/14/12 0450  NA 146* 145  K 2.9* 4.5  CL 106 113*  CO2 29 26  GLUCOSE 122* 93  BUN 33* 31*  CREATININE 0.76 0.67  CALCIUM 9.3 8.4   Liver Function Tests:  Recent Labs Lab 08/13/12 1052  AST 61*  ALT 53*  ALKPHOS 97  BILITOT 0.4  PROT 6.9  ALBUMIN 2.5*   CBC:  Recent Labs Lab 08/13/12 1052 08/14/12 0450  WBC 13.6* 10.5  NEUTROABS 10.3*  --   HGB 13.4 12.4  HCT 39.1 37.2  MCV 90.5 92.5  PLT 217 197    Recent Results (from the past 240 hour(s))  URINE CULTURE     Status: None   Collection Time    08/13/12 12:25 PM      Result Value Range Status   Specimen Description URINE, CATHETERIZED   Final   Special Requests NONE   Final   Culture  Setup Time 08/13/2012 14:13   Final   Colony Count >=100,000 COLONIES/ML   Final   Culture ESCHERICHIA COLI   Final   Report Status 08/15/2012 FINAL   Final   Organism ID, Bacteria ESCHERICHIA COLI   Final    Scheduled Meds: . antiseptic oral rinse  15 mL Mouth Rinse BID  . benztropine  1 mg Oral Daily  .  buPROPion  150 mg Oral Daily  . carbamide peroxide  5 drop Left Ear BID  . cefUROXime  500 mg Oral BID WC  . divalproex  500 mg Oral Daily  . donepezil  10 mg Oral QHS  . heparin  5,000 Units Subcutaneous Q8H  . memantine  5 mg Oral BID  . omega-3 acid ethyl esters  1 g Oral BID  . pantoprazole  40 mg Oral Daily  . risperiDONE  0.5 mg Oral BID  . senna-docusate  1 tablet Oral QHS  . sertraline  100 mg Oral Daily  . sertraline  50 mg Oral QHS  . simvastatin  40 mg Oral QPM  . sodium chloride  3 mL Intravenous Q12H  . topiramate  25 mg Oral Daily   Continuous Infusions: . sodium chloride 20 mL/hr at 08/17/12 2000     Chasty Randal  [facility] Pager [phone]. If 8PM-8AM, please contact night-coverage at [email or web address], password [facility] 08/17/2012, 9:51 PM  LOS: 4 days

## 2012-08-17 NOTE — Progress Notes (Signed)
Follow-up:  This Clinical research associate spoke with patient's husband, Iantha Fallen and daughter, Neysa Bonito initially, to inform of PCS options to follow at SNF; however, while speaking with family, they were informed by CSW, options have been limited for care at discharge; after family spoke with CSW, this Clinical research associate discussed with family option of home with hospice services, now, or after SNF; or possibility of brief respite stay and transition to home with hospice; family voiced that they 'really want patient to be able to get some re-hab for strengthening /conditioning, if possible, and hope to be able to work on finances to be able to continue to consider SNF stay for longer term'; at this time time they are not looking to proceed with option for home with hospice. They were appreciative of the time to talk through all these options and are awaiting further discussion with [place] CSW;  Erie Noe CSW informed of above.   Valente David, RN 08/17/2012, 1:24 PM Palliative Medicine Team RN Liaison [phone]

## 2012-08-18 ENCOUNTER — Inpatient Hospital Stay (HOSPITAL_COMMUNITY): Payer: Medicare Other

## 2012-08-18 MED ORDER — SODIUM CHLORIDE 0.9 % IV SOLN
INTRAVENOUS | Status: DC
  Administered 2012-08-18: 22:00:00 via INTRAVENOUS

## 2012-08-18 MED ORDER — ACETAMINOPHEN 325 MG PO TABS
650.0000 mg | ORAL_TABLET | Freq: Four times a day (QID) | ORAL | Status: DC | PRN
  Administered 2012-08-18: 650 mg via ORAL
  Filled 2012-08-18: qty 2

## 2012-08-18 NOTE — Progress Notes (Signed)
[facility] PROGRESS NOTE Assessment/Plan:  Acute metabolic encephalopathy due to E.coli UTI - slowly improving and reaching baseline according to family members. -change antibiotics to ceftin to complete treatment of her UTI. - Patient continue to be afebrile and leukocytosis resolved. - family met with palliative care medicine for goals of care; reviewed PC note for details (but essentially DNR/DNI and will like to go to SNF with PC following). - cannot go back home, family cannot take care of her, seem she has advance dementia. -Plan is for patient to be discharge on Monday, 08/20/12 to SNF [facility]). Will follow and address any changes in her status.  E. Coli UTI: as mentioned above. We have completed 3 days of IV antibiotics and will now switch to ceftin to finish Ab'x therapy. Microorganism is pan-sensitive.  Dementia - At BASELINE PER FAMILY - Continue psychotropic medications. - good response to cogentin for acathisia and muscle spasm due to use of psychotropic drugs. Will continue  HYPERTENSION: -Continue meds -BP well controlled  HLD: continue statins.  Hx of migraines: will continue topamax.  GERD: continue PPI.  Left ear pain: no infection appreciated. -good response to otic perodixe use; patient reports not further discomfort; will continue tx for a total of 3 days.  Fever: will check CBC, CXR and use PRN tylenol. Patient on ceftin for UTI.   DVT: heparin  Code Status: DNR/DNI Family Communication: son is at bedside.  Disposition Plan: plan is to discharge to SNF with Palliative Care following  Consultants:  palliative  Procedures:  See below for x-ray reports  Antibiotics:  Rocephin 3.10.2014-08/16/12  ceftin 08/17/12  HPI/Subjective: Patient with max temp of 101.5, no Pain, comfortable; denies left ear discomfort today and was able to follow commands. Family reports brief episodes of dilusions; but none appreciated by me on exam.    Objective: Filed Vitals:   08/18/12 1338 08/18/12 1634 08/18/12 1851 08/18/12 2030  BP: 107/50 124/53  98/46  Pulse: 87 89  77  Temp: 100.7 F (38.2 C) 101.5 F (38.6 C) 97.9 F (36.6 C) 97.6 F (36.4 C)  TempSrc: Axillary Axillary Axillary Axillary  Resp: 21 20  20   Height:      Weight:      SpO2: 97% 98%  93%    Intake/Output Summary (Last 24 hours) at 08/18/12 2127 Last data filed at 08/18/12 2037  Gross per 24 hour  Intake 754.67 ml  Output   2225 ml  Net -1470.33 ml   Filed Weights   08/13/12 1641 08/13/12 2049 08/14/12 2043  Weight: 68.7 kg (151 lb 7.3 oz) 68.7 kg (151 lb 7.3 oz) 68.7 kg (151 lb 7.3 oz)    Exam:  General: alert, oriented X2; communicative and appropriate; NAD and no aggressive. Febrile   HEENT: No bruits, no goiter.  Heart: Regular rate and rhythm, without murmurs, rubs, gallops.  Lungs: Good air movement, no crackles, no rhonchi Abdomen: Soft, nontender, nondistended, positive bowel sounds.  Neuro: non focal deficit appreciated. Slight rigidity most likely from psychotropic drugs.   Data Reviewed: Basic Metabolic Panel:  Recent Labs Lab 08/13/12 1052 08/14/12 0450  NA 146* 145  K 2.9* 4.5  CL 106 113*  CO2 29 26  GLUCOSE 122* 93  BUN 33* 31*  CREATININE 0.76 0.67  CALCIUM 9.3 8.4   Liver Function Tests:  Recent Labs Lab 08/13/12 1052  AST 61*  ALT 53*  ALKPHOS 97  BILITOT 0.4  PROT 6.9  ALBUMIN 2.5*   CBC:  Recent Labs Lab 08/13/12 1052 08/14/12 0450  WBC 13.6* 10.5  NEUTROABS 10.3*  --   HGB 13.4 12.4  HCT 39.1 37.2  MCV 90.5 92.5  PLT 217 197    Recent Results (from the past 240 hour(s))  URINE CULTURE     Status: None   Collection Time    08/13/12 12:25 PM      Result Value Range Status   Specimen Description URINE, CATHETERIZED   Final   Special Requests NONE   Final   Culture  Setup Time 08/13/2012 14:13   Final   Colony Count >=100,000 COLONIES/ML   Final   Culture ESCHERICHIA COLI   Final    Report Status 08/15/2012 FINAL   Final   Organism ID, Bacteria ESCHERICHIA COLI   Final    Scheduled Meds: . antiseptic oral rinse  15 mL Mouth Rinse BID  . benztropine  1 mg Oral Daily  . buPROPion  150 mg Oral Daily  . carbamide peroxide  5 drop Left Ear BID  . cefUROXime  500 mg Oral BID WC  . divalproex  500 mg Oral Daily  . donepezil  10 mg Oral QHS  . heparin  5,000 Units Subcutaneous Q8H  . memantine  5 mg Oral BID  . omega-3 acid ethyl esters  1 g Oral BID  . pantoprazole  40 mg Oral Daily  . risperiDONE  0.5 mg Oral BID  . senna-docusate  1 tablet Oral QHS  . sertraline  100 mg Oral Daily  . sertraline  50 mg Oral QHS  . simvastatin  40 mg Oral QPM  . sodium chloride  3 mL Intravenous Q12H  . topiramate  25 mg Oral Daily   Continuous Infusions: . sodium chloride 20 mL/hr at 08/18/12 2000     Brandy [name]  [facility] Pager [phone]. If 8PM-8AM, please contact night-coverage at [email or web address], password [facility] 08/18/2012, 9:27 PM  LOS: 5 days

## 2012-08-18 NOTE — Progress Notes (Signed)
MD> was notified of T+101.5 axillary,orders for chest xray and tylenol were received.keep monitoring pt. Closely.

## 2012-08-19 LAB — CBC
MCV: 89.7 fL (ref 78.0–100.0)
Platelets: 274 10*3/uL (ref 150–400)
RDW: 13.8 % (ref 11.5–15.5)
WBC: 12.5 10*3/uL — ABNORMAL HIGH (ref 4.0–10.5)

## 2012-08-19 LAB — BASIC METABOLIC PANEL
Chloride: 103 mEq/L (ref 96–112)
Creatinine, Ser: 0.64 mg/dL (ref 0.50–1.10)
GFR calc Af Amer: >90 mL/min (ref >90)

## 2012-08-19 MED ORDER — PANTOPRAZOLE SODIUM 40 MG PO PACK
40.0000 mg | PACK | Freq: Every day | ORAL | Status: DC
  Administered 2012-08-19 – 2012-08-20 (×2): 40 mg via ORAL
  Filled 2012-08-19 (×3): qty 20

## 2012-08-19 MED ORDER — DIVALPROEX SODIUM 125 MG PO CPSP
250.0000 mg | ORAL_CAPSULE | Freq: Two times a day (BID) | ORAL | Status: DC
  Administered 2012-08-19 – 2012-08-20 (×3): 250 mg via ORAL
  Filled 2012-08-19 (×4): qty 2

## 2012-08-19 MED ORDER — BUPROPION HCL 75 MG PO TABS
75.0000 mg | ORAL_TABLET | Freq: Two times a day (BID) | ORAL | Status: DC
  Administered 2012-08-19 – 2012-08-20 (×3): 75 mg via ORAL
  Filled 2012-08-19 (×4): qty 1

## 2012-08-19 NOTE — Progress Notes (Signed)
Removed Urethral catheter per MD order.  Patient tolerated will. Will continue to monitor.  Steele Berg RN

## 2012-08-19 NOTE — Progress Notes (Signed)
[facility] PROGRESS NOTE Assessment/Plan:  Acute metabolic encephalopathy due to E.coli UTI - slowly improving and reaching baseline according to family members. -change antibiotics to ceftin to complete treatment of her UTI. - Patient continue to be afebrile and leukocytosis resolved. - family met with palliative care medicine for goals of care; reviewed PC note for details (but essentially DNR/DNI and will like to go to SNF with PC following). - cannot go back home, family cannot take care of her, seem she has advance dementia. -Plan is for patient to be discharge on Monday, 08/20/12 to SNF [facility]). Will follow and address any changes in her status.  E. Coli UTI: as mentioned above. We have completed 3 days of IV antibiotics and will now switch to ceftin to finish Ab'x therapy. Microorganism is pan-sensitive.  Dementia - At BASELINE PER FAMILY - Continue psychotropic medications. - good response to cogentin for acathisia and muscle spasm due to use of psychotropic drugs. Will continue  HYPERTENSION: -BP well controlled -mild episodes of soft BP; no on any antihypertensive drugs.  HLD: continue statins.  Hx of migraines: will continue topamax.  GERD: continue PPI.  Left ear pain: no infection appreciated. -good response to otic perodixe use; patient reports not further discomfort; will continue tx for a total of 3 days.   Fever: no abnormalities on x-ray; no further episodes of fever. Will discontinue foley and continue current antibiotics.   DVT: heparin  Code Status: DNR/DNI Family Communication: son is at bedside.  Disposition Plan: plan is to discharge to SNF with Palliative Care following her on 3/17  Consultants:  palliative  Procedures:  See below for x-ray reports  Antibiotics:  Rocephin 3.10.2014-08/16/12  ceftin 08/17/12  HPI/Subjective: No further episodes of fever; no CP, no ear pain, no nausea and no vomiting. CXR w/o acute  abnormalities.  Objective: Filed Vitals:   08/18/12 2030 08/19/12 0517 08/19/12 0925 08/19/12 1310  BP: 98/46 83/45 99/47  107/49  Pulse: 77 77 80 80  Temp: 97.6 F (36.4 C) 99.7 F (37.6 C) 99.7 F (37.6 C) 98 F (36.7 C)  TempSrc: Axillary Oral    Resp: 20 22 19 19   Height:      Weight:      SpO2: 93% 95% 94% 93%    Intake/Output Summary (Last 24 hours) at 08/19/12 1504 Last data filed at 08/19/12 1308  Gross per 24 hour  Intake    760 ml  Output   1600 ml  Net   -840 ml   Filed Weights   08/13/12 1641 08/13/12 2049 08/14/12 2043  Weight: 68.7 kg (151 lb 7.3 oz) 68.7 kg (151 lb 7.3 oz) 68.7 kg (151 lb 7.3 oz)    Exam:  General: alert, oriented X2; communicative and appropriate; NAD and no aggressiveness. No further fever   HEENT: No bruits, no goiter.  Heart: Regular rate and rhythm, without murmurs, rubs, gallops.  Lungs: Good air movement, no crackles, no rhonchi Abdomen: Soft, nontender, nondistended, positive bowel sounds.  Neuro: non focal deficit appreciated. Slight rigidity most likely from psychotropic drugs.   Data Reviewed: Basic Metabolic Panel:  Recent Labs Lab 08/13/12 1052 08/14/12 0450 08/19/12 0600  NA 146* 145 139  K 2.9* 4.5 3.8  CL 106 113* 103  CO2 29 26 27   GLUCOSE 122* 93 100*  BUN 33* 31* 20  CREATININE 0.76 0.67 0.64  CALCIUM 9.3 8.4 9.1   Liver Function Tests:  Recent Labs Lab 08/13/12 1052  AST 61*  ALT  53*  ALKPHOS 97  BILITOT 0.4  PROT 6.9  ALBUMIN 2.5*   CBC:  Recent Labs Lab 08/13/12 1052 08/14/12 0450 08/19/12 0600  WBC 13.6* 10.5 12.5*  NEUTROABS 10.3*  --   --   HGB 13.4 12.4 11.4*  HCT 39.1 37.2 34.1*  MCV 90.5 92.5 89.7  PLT 217 197 274    Recent Results (from the past 240 hour(s))  URINE CULTURE     Status: None   Collection Time    08/13/12 12:25 PM      Result Value Range Status   Specimen Description URINE, CATHETERIZED   Final   Special Requests NONE   Final   Culture  Setup Time  08/13/2012 14:13   Final   Colony Count >=100,000 COLONIES/ML   Final   Culture ESCHERICHIA COLI   Final   Report Status 08/15/2012 FINAL   Final   Organism ID, Bacteria ESCHERICHIA COLI   Final    Scheduled Meds: . antiseptic oral rinse  15 mL Mouth Rinse BID  . benztropine  1 mg Oral Daily  . buPROPion  75 mg Oral BID  . cefUROXime  500 mg Oral BID WC  . divalproex  250 mg Oral Q12H  . donepezil  10 mg Oral QHS  . heparin  5,000 Units Subcutaneous Q8H  . memantine  5 mg Oral BID  . pantoprazole sodium  40 mg Oral Daily  . risperiDONE  0.5 mg Oral BID  . senna-docusate  1 tablet Oral QHS  . sertraline  100 mg Oral Daily  . sertraline  50 mg Oral QHS  . sodium chloride  3 mL Intravenous Q12H  . topiramate  25 mg Oral Daily   Continuous Infusions: . sodium chloride 20 mL/hr at 08/18/12 2210     [facility]  [facility] Pager [phone]. If 8PM-8AM, please contact night-coverage at [email or web address], password [facility] 08/19/2012, 3:04 PM  LOS: 6 days

## 2012-08-20 DIAGNOSIS — I4892 Unspecified atrial flutter: Secondary | ICD-10-CM

## 2012-08-20 MED ORDER — ENSURE COMPLETE PO LIQD: 237.0000 mL | Freq: Two times a day (BID) | ORAL | Status: DC

## 2012-08-20 MED ORDER — METOPROLOL SUCCINATE 12.5 MG HALF TABLET: 12.5000 mg | ORAL_TABLET | Freq: Every day | ORAL | Status: DC

## 2012-08-20 MED ORDER — BENZTROPINE MESYLATE 1 MG PO TABS: 0.5000 mg | ORAL_TABLET | Freq: Every day | ORAL | Status: DC

## 2012-08-20 MED ORDER — DIVALPROEX SODIUM 125 MG PO CPSP: 250.0000 mg | ORAL_CAPSULE | Freq: Two times a day (BID) | ORAL | Status: DC

## 2012-08-20 MED ORDER — CEFUROXIME AXETIL 500 MG PO TABS: 500.0000 mg | ORAL_TABLET | Freq: Two times a day (BID) | ORAL | Status: AC

## 2012-08-20 MED ORDER — DIGOXIN 0.25 MG/ML IJ SOLN
0.2500 mg | Freq: Four times a day (QID) | INTRAMUSCULAR | Status: DC
  Administered 2012-08-20: 0.25 mg via INTRAVENOUS
  Filled 2012-08-20 (×4): qty 1

## 2012-08-20 MED ORDER — BUPROPION HCL 75 MG PO TABS: 75.0000 mg | ORAL_TABLET | Freq: Two times a day (BID) | ORAL | Status: DC

## 2012-08-20 MED ORDER — DIGOXIN 125 MCG PO TABS: 0.1250 mg | ORAL_TABLET | Freq: Every day | ORAL | Status: DC

## 2012-08-20 NOTE — Discharge Summary (Signed)
Physician Discharge Summary  Brandy [name] ZOX:[id number] DOB: 1929/06/26 DOA: 08/13/2012  PCP: Eustaquio Boyden, MD  Admit date: 08/13/2012 Discharge date: 08/20/2012  Time spent: >30 minutes  Discharge Diagnoses:  Principal Problem:   Encephalopathy, metabolic Active Problems:   HYPERLIPIDEMIA   HYPERTENSION   Dementia   Leg swelling   UTI (lower urinary tract infection)   Dysphagia, unspecified   Adult failure to thrive   Unspecified constipation protein calorie malnutrition No sustained flutter  Discharge Condition: stable and improved. Will discharge to SNF with palliatiave care following patient.  Diet recommendation: Dysphagia 2 diet; thin liquids  Filed Weights   08/13/12 1641 08/13/12 2049 08/14/12 2043  Weight: 68.7 kg (151 lb 7.3 oz) 68.7 kg (151 lb 7.3 oz) 68.7 kg (151 lb 7.3 oz)    History of present illness:  79 y.o. female with past medical history of anxiety, depression and dementia. Patient has previous evaluation to [facility]. Patient brought to the hospital because of generalized weakness and altered mental status. Patient is poor historian because of dementia, the history was obtained from her son who does not live with her. Son said patient functional and overall health status is declining for the past few month, and for the past one week she is getting very weak, poor appetite and she doesn't want to eat or drink. She got very weak to the point she couldn't walk and they brought her to the hospital today for further evaluation. Upon initial evaluation in the emergency department, CT scan of the head showed no abnormalities, urinalysis consistent with UTI, BMP is consistent with dehydration and hypokalemia.   Hospital Course:  Acute metabolic encephalopathy due to E.coli UTI  - slowly improving and reaching baseline according to family members.  -change antibiotics to ceftin to complete treatment of her UTI.  - Patient continue to  be afebrile and leukocytosis resolved.  - family met with palliative care medicine for goals of care; reviewed PC note for details (but essentially DNR/DNI and will like to go to SNF with PC following). Plan is to try rehabilitation and if things failed through then will move on into comfort. -Plan is for patient to be discharge on Monday, 08/20/12 to SNF [facility] [place]). Palliative care will follow and address any changes in her status.   E. Coli UTI: as mentioned above. We have completed 3 days of IV antibiotics and will now switch to ceftin to finish Ab'x therapy. Microorganism is pan-sensitive.   Dementia  - At BASELINE PER FAMILY  - Continue psychotropic medications.  - good response to cogentin for acathisia and muscle spasm due to use of psychotropic drugs. Will continue at lower dose.  HYPERTENSION:  -BP well controlled  -mild episodes of soft BP; but tolerating well low dose toprol  Hx of migraines: will continue topamax.   GERD: continue PPI.   Left ear pain: no infection appreciated.  -due to cerumen. -good response to otic perodixe use; patient reports not further discomfort.   Fever: no abnormalities on x-ray; no further episodes of fever. Will discontinue foley and continue current antibiotics for UTI. Patient has remained afebrile now for 48 hours  No susatined A. Fib: Most likely associated with use of benztropine at 1mg  dose No sustained and patient not a candidate for anticoagulation long term Will use lopressor12.5mg  daily to control tachycardia Benztropine dose decrease to 0.5mg  daily.  Moderate Protein calorie malnutrition: -continue ensure  Procedures: See below for x-ray reports  Consultations:  SPT  Cardiology (curbside)  Palliative Care  Discharge Exam: Filed Vitals:   08/20/12 0602 08/20/12 0610 08/20/12 0900 08/20/12 1325  BP: 95/62 100/58 110/64 135/70  Pulse: 137  105 98  Temp:   98 F (36.7 C) 98.4 F (36.9 C)  TempSrc:   Oral Oral   Resp:   22 22  Height:      Weight:      SpO2:   95% 96%   General: alert, oriented X2; communicative and appropriate; NAD and no aggressiveness. No further fever  HEENT: No bruits, no goiter.  Heart: Regular rate and rhythm, without murmurs, rubs, gallops.  Lungs: Good air movement, no crackles, no rhonchi  Abdomen: Soft, nontender, nondistended, positive bowel sounds.  Neuro: non focal deficit appreciated. Slight rigidity most likely from psychotropic drugs.  Discharge Instructions  Discharge Orders   Future Orders Complete By Expires     Discharge instructions  As directed     Comments:      Take medications as prescribed Keep yourself well hydrated Palliative care to follow at the facility        Medication List    STOP taking these medications       buPROPion 150 MG 24 hr tablet  Commonly known as:  WELLBUTRIN XL     divalproex 500 MG 24 hr tablet  Commonly known as:  DEPAKOTE ER     Fish Oil 300 MG Caps     hydrochlorothiazide 12.5 MG capsule  Commonly known as:  MICROZIDE     simvastatin 40 MG tablet  Commonly known as:  ZOCOR      TAKE these medications       benztropine 1 MG tablet  Commonly known as:  COGENTIN  Take 0.5 tablets (0.5 mg total) by mouth daily.     buPROPion 75 MG tablet  Commonly known as:  WELLBUTRIN  Take 1 tablet (75 mg total) by mouth 2 (two) times daily.     CALCIUM 600+D 600-400 MG-UNIT per tablet  Generic drug:  Calcium Carbonate-Vitamin D  Take 2 tablets by mouth daily. For bones     cefUROXime 500 MG tablet  Commonly known as:  CEFTIN  Take 1 tablet (500 mg total) by mouth 2 (two) times daily with a meal.     divalproex 125 MG capsule  Commonly known as:  DEPAKOTE SPRINKLE  Take 2 capsules (250 mg total) by mouth every 12 (twelve) hours.     donepezil 10 MG tablet  Commonly known as:  ARICEPT  Take 1 tablet (10 mg total) by mouth at bedtime.     feeding supplement Liqd  Take 237 mLs by mouth 2 (two) times daily  between meals.     fexofenadine 180 MG tablet  Commonly known as:  ALLEGRA  Take 180 mg by mouth daily. As needed for allergies     memantine 5 MG tablet  Commonly known as:  NAMENDA  Take 5 mg by mouth 2 (two) times daily.     metoprolol succinate 12.5 mg Tb24  Commonly known as:  TOPROL-XL  Take 0.5 tablets (12.5 mg total) by mouth daily.     multivitamin tablet  Take 1 tablet by mouth daily.     omeprazole 40 MG capsule  Commonly known as:  PRILOSEC  Take 1 capsule (40 mg total) by mouth daily. For reflux     risperiDONE 0.5 MG tablet  Commonly known as:  RISPERDAL  Take 0.5 mg by mouth 2 (two) times daily.  sertraline 50 MG tablet  Commonly known as:  ZOLOFT  Take 50-100 mg by mouth 2 (two) times daily. Take 2 tabs in the morning and 1 tab in the evening     topiramate 25 MG tablet  Commonly known as:  TOPAMAX  Take 1 tablet (25 mg total) by mouth daily.          The results of significant diagnostics from this hospitalization (including imaging, microbiology, ancillary and laboratory) are listed below for reference.    Significant Diagnostic Studies: Dg Chest 1 View  08/18/2012  *RADIOLOGY REPORT*  Clinical Data: Rule out pneumonia.  Lethargy.  Decreased level of consciousness.  CHEST - 1 VIEW  Comparison: 08/13/2012  Findings: Heart size is normal.  Aorta is tortuous.  There is right lower lobe atelectasis or scarring.  Mildly prominent interstitial markings are noted at the left lung base, likely chronic.  IMPRESSION:  1.  Minimal right lower lobe atelectasis/scar. 2.  No definite consolidations.   Original Report Authenticated By: Norva Pavlov, M.D.    Dg Chest 2 View  08/13/2012  *RADIOLOGY REPORT*  Clinical Data: Fatigue, altered mental status.  CHEST - 2 VIEW  Comparison: November 22, 2011.  Findings: Cardiomediastinal silhouette appears normal.  No acute pulmonary disease is noted.  Bony thorax is intact.  IMPRESSION: No acute cardiopulmonary abnormality  seen.   Original Report Authenticated By: Lupita Raider.,  M.D.    Ct Head Wo Contrast  08/13/2012  *RADIOLOGY REPORT*  Clinical Data: Fatigue, altered mental status, weakness, some right facial droop  CT HEAD WITHOUT CONTRAST  Technique:  Contiguous axial images were obtained from the base of the skull through the vertex without contrast.  Comparison: MR brain scan of 07/01/2010  Findings: The ventricular system is prominent as are the cortical sulci, indicative of diffuse atrophy.  The septum is midline in position.  Moderate small vessel ischemic change is noted throughout the periventricular white matter.  No hemorrhage, mass lesion, or acute infarction is seen.  No calvarial abnormality is seen.  The paranasal sinuses are well pneumatized.  IMPRESSION: Atrophy and moderate small vessel ischemic change.  No acute intracranial abnormality.   Original Report Authenticated By: Dwyane Dee, M.D.     Microbiology: Recent Results (from the past 240 hour(s))  URINE CULTURE     Status: None   Collection Time    08/13/12 12:25 PM      Result Value Range Status   Specimen Description URINE, CATHETERIZED   Final   Special Requests NONE   Final   Culture  Setup Time 08/13/2012 14:13   Final   Colony Count >=100,000 COLONIES/ML   Final   Culture ESCHERICHIA COLI   Final   Report Status 08/15/2012 FINAL   Final   Organism ID, Bacteria ESCHERICHIA COLI   Final     Labs: Basic Metabolic Panel:  Recent Labs Lab 08/14/12 0450 08/19/12 0600  NA 145 139  K 4.5 3.8  CL 113* 103  CO2 26 27  GLUCOSE 93 100*  BUN 31* 20  CREATININE 0.67 0.64  CALCIUM 8.4 9.1   CBC:  Recent Labs Lab 08/14/12 0450 08/19/12 0600  WBC 10.5 12.5*  HGB 12.4 11.4*  HCT 37.2 34.1*  MCV 92.5 89.7  PLT 197 274    Signed:  Lehman Whiteley  [facility] 08/20/2012, 2:30 PM

## 2012-08-20 NOTE — Progress Notes (Signed)
Utilization review completed.  

## 2012-08-20 NOTE — Progress Notes (Signed)
Report called to [place], LPN at [facility]. Pt prepared for discharge. IV previously removed. Pt remains stable. Tele dc'd. EMS here to transport pt to nursing home. Brandy [name], Brandy [name]

## 2012-08-20 NOTE — Progress Notes (Signed)
[facility] progress note. Chief complaint. Tachycardia. History of present illness. This 79 year old female in hospital with acute metabolic encephalopathy 2 UTI. She also has baseline dementia and hypertension. Per telemetry patient noted that with a sustained heart rate in the 130 to 140 range. I requested a stat EKG and this looks suggestive of atrial fib versus atrial flutter, possibly moving in and out of both. At the bedside the patient is alert and in no visible distress. She denies any chest pain. She denies any worsening dyspnea. Vital signs temperature 98.4, pulse 137, respiration 22, blood pressure 100/58. O2 sats 92%. General appearance. Well-developed elderly female who is alert, cooperative in no distress. Cardiac. Tachycardic and irregular. Lungs. Breath sounds clear. Abdomen. Soft with positive bowel sounds. Impression/plan. Problem #1. Tachycardia atrial fib versus flutter. Patient's blood pressure is to soft currently to allow a Cardizem drip. Also excluded a beta blocker for this reason. I will load the patient with digoxin 0.25 mg IV now and repeat 0.25 mg IV in 6 hours. We can then start the patient on by mouth digoxin 0.125 mg tomorrow.

## 2012-08-20 NOTE — Progress Notes (Signed)
Physical Therapy Treatment Patient Details Name: Brandy [name] MRN: [id number] DOB: Sep 28, 1929 Today's Date: 08/20/2012 Time: 4098-1191 PT Time Calculation (min): 40 min  PT Assessment / Plan / Recommendation Comments on Treatment Session  79 y.o. female admitted to [facility] for generalized weakness and AMS.  She presents today with increased activity tolerance.  She was able to sit EOB >15 mins with max asssit.  Used lift to get into chair.      Follow Up Recommendations  SNF     Does the patient have the potential to tolerate intense rehabilitation   no  Barriers to Discharge  none      Equipment Recommendations  Wheelchair (measurements PT);Wheelchair cushion (measurements PT)    Recommendations for Other Services  none  Frequency Min 3X/week   Plan Discharge plan remains appropriate;Frequency remains appropriate    Precautions / Restrictions Precautions Precautions: Fall Precaution Comments: fear of falling as well.  Restrictions Weight Bearing Restrictions: No   Pertinent Vitals/Pain Groans with movement of legs (chronic pain and stiffness per family).    Mobility  Bed Mobility Bed Mobility: Rolling Right;Rolling Left;Supine to Sit;Sitting - Scoot to [place] of Bed;Sit to Supine Rolling Right: 1: +1 Total assist Rolling Right: Patient Percentage: 0% Rolling Left: 1: +1 Total assist Rolling Left: Patient Percentage: 0% Supine to Sit: 1: +1 Total assist;HOB elevated Supine to Sit: Patient Percentage: 10% (pt attempting to move legs EOB) Sitting - Scoot to Edge of Bed: 2: Max assist Sit to Supine: 1: +1 Total assist;HOB flat Details for Bed Mobility Assistance: Pt did not assist or initiate movement for rolling, but when attempting to get to sitting she was able to intiate moving bil legs to EOB.  Max assist to support trunk for balance while using draw pad to weight shift hips to EOB.   Transfers Transfers: Sit to Stand;Stand to Sit Sit to Stand: From bed;With upper  extremity assist;1: +2 Total assist Sit to Stand: Patient Percentage: 10% Stand to Sit: 1: +2 Total assist;Without upper extremity assist;To bed Stand to Sit: Patient Percentage: 10% Transfer via Lift Equipment: Maximove Details for Transfer Assistance: attempted stand x 2, unable to clear buttocks from bed, so used the maxi move lift to get pt OOB to chair Ambulation/Gait Ambulation/Gait Assistance: Not tested (comment) (pt unable)    Exercises General Exercises - Lower Extremity Ankle Circles/Pumps: AROM;Both;10 reps;Seated Long Arc Quad: AROM;Both;5 reps;Seated Hip ABduction/ADduction: AROM;Both;5 reps;Seated     PT Goals Acute Rehab PT Goals PT Goal: Supine/Side to Sit - Progress: Progressing toward goal PT Goal: Sit to Supine/Side - Progress: Progressing toward goal PT Transfer Goal: Bed to Chair/Chair to Bed - Progress: Progressing toward goal  Visit Information  Last PT Received On: 08/20/12 Assistance Needed: +2    Subjective Data  Subjective: Pt talking and able to answer some questions, but at times non-sensical.  Able to name everyone in room with increased time and verbal cues.     Cognition  Cognition Overall Cognitive Status: History of cognitive impairments - at baseline Arousal/Alertness: Awake/alert Orientation Level: Disoriented to;Place;Time;Situation Behavior During Session: Restless Attention - Other Comments: decreased sustained attention Memory: Decreased recall of precautions Following Commands: Follows one step commands inconsistently Safety/Judgement: Decreased safety judgement for tasks assessed Awareness of Errors: Assistance required to identify errors made;Assistance required to correct errors made Cognition - Other Comments: pt did not know that she was leaning to the right when asked.      Balance  Static Sitting Balance Static Sitting -  Balance Support: Bilateral upper extremity supported;Feet supported Static Sitting - Level of  Assistance: 2: Max assist Static Sitting - Comment/# of Minutes: worked on finding midline, using arms to support herself, moving legs in sitting, cognition (naming people in room), and upright posture ("look up" "sit tall" "lift your chest"  End of Session PT - End of Session Equipment Utilized During Treatment: Oxygen Activity Tolerance: Patient limited by fatigue;Patient limited by pain Patient left: in chair;with call bell/phone within reach;with family/visitor present (daughter, son and husband)     Brandy [name] B. Brandy [name], PT, DPT [phone]   08/20/2012, 3:21 PM

## 2012-08-23 ENCOUNTER — Non-Acute Institutional Stay (SKILLED_NURSING_FACILITY): Payer: Medicare Other | Admitting: Adult Health

## 2012-08-23 DIAGNOSIS — N390 Urinary tract infection, site not specified: Secondary | ICD-10-CM

## 2012-08-23 DIAGNOSIS — I10 Essential (primary) hypertension: Secondary | ICD-10-CM

## 2012-08-23 DIAGNOSIS — K219 Gastro-esophageal reflux disease without esophagitis: Secondary | ICD-10-CM

## 2012-08-23 DIAGNOSIS — F341 Dysthymic disorder: Secondary | ICD-10-CM

## 2012-08-23 DIAGNOSIS — F29 Unspecified psychosis not due to a substance or known physiological condition: Secondary | ICD-10-CM

## 2012-08-23 DIAGNOSIS — G43009 Migraine without aura, not intractable, without status migrainosus: Secondary | ICD-10-CM

## 2012-08-23 DIAGNOSIS — J309 Allergic rhinitis, unspecified: Secondary | ICD-10-CM

## 2012-08-23 DIAGNOSIS — F0390 Unspecified dementia without behavioral disturbance: Secondary | ICD-10-CM

## 2012-08-27 ENCOUNTER — Non-Acute Institutional Stay (SKILLED_NURSING_FACILITY): Payer: Medicare Other | Admitting: Internal Medicine

## 2012-08-27 DIAGNOSIS — N390 Urinary tract infection, site not specified: Secondary | ICD-10-CM

## 2012-08-27 DIAGNOSIS — F0390 Unspecified dementia without behavioral disturbance: Secondary | ICD-10-CM

## 2012-08-27 DIAGNOSIS — I10 Essential (primary) hypertension: Secondary | ICD-10-CM

## 2012-08-27 DIAGNOSIS — E46 Unspecified protein-calorie malnutrition: Secondary | ICD-10-CM

## 2012-08-27 NOTE — Progress Notes (Signed)
agree

## 2012-09-03 NOTE — Progress Notes (Signed)
Patient ID: Brandy [name], female   DOB: 1929-09-15, 79 y.o.   MRN: [id number]        HISTORY & PHYSICAL  DATE:  08/27/2012  FACILITY: [facility]   LEVEL OF CARE: SNF   ALLERGIES:  Allergies  Allergen Reactions  . Other     CRANBERRY JUICE  . Sulfonamide Derivatives     REACTION: Can't remember     CHIEF COMPLAINT:  Manage UTI, dementia and hypertension.    HISTORY OF PRESENT ILLNESS:  79 year-old, Caucasian female was hospitalized secondary to generalized weakness and altered mental status.    UTI.  In the emergency room, head CT did not show any acute findings.  Urinalysis was consistent with a UTI.  Urine culture grew E. coli significantly.  Patient was treated and discharged on Ceftin.  She is tolerating the Ceftin without any problems.  She is admitted to this facility for short-term rehabilitation.    HTN: Pt 's HTN remains stable.  Staff Denies CP, sob, DOE, pedal edema, headaches, dizziness or visual disturbances.  No complications from the medications currently being used.  Last BP was 130/60.  Patient is a poor historian due to dementia.    DEMENTIA: The dementia remains stable and continues to function adequately in the current living environment with supervision.  The patient has had little changes in behavior. No complications noted from the medications presently being used.  Patient is a poor historian due to dementia.     PAST MEDICAL HISTORY :  Past Medical History  Diagnosis Date  . Anxiety   . Depression   . Dementia     neuro eval WNL, normal MRI, [facility] after admission with dx mild-mod dementia  . HTN (hypertension)   . HLD (hyperlipidemia)   . Allergic rhinitis   . OSA (obstructive sleep apnea)     intermittent CPAP use  . Migraine   . Arthritis   . Herpes zoster 12/29/2006  . Urinary incontinence      PAST SURGICAL HISTORY: Past Surgical History  Procedure Laterality Date  . Appendectomy    . Neck surgery     CALCIUM DEPOSITS  . Abdominal hysterectomy      TAH for fibroids     SOCIAL HISTORY:  reports that she quit smoking about 43 years ago. Her smoking use included Cigarettes. She has a 16 pack-year smoking history. She has never used smokeless tobacco. She reports that she does not drink alcohol or use illicit drugs.   FAMILY HISTORY:  Family History  Problem Relation Age of Onset  . Heart attack Father     in 97's  . Alcohol abuse Father   . Lymphoma Mother   . Cancer Mother     LYMPHOMA  . Coronary artery disease Maternal Grandmother   . Breast cancer Maternal Grandmother   . Sudden death Maternal Grandfather      CURRENT MEDICATIONS: Reviewed per [facility]  REVIEW OF SYSTEMS:  Unobtainable due to dementia.   PHYSICAL EXAMINATION  VS:  T  101.8      P  88       RR  18      BP  130/60      POX% 97 room air       WT (Lb)  162  GENERAL: no acute distress, normal body habitus SKIN: warm & dry, no suspicious lesions or rashes, no excessive dryness EYES: conjunctivae normal, sclerae normal, normal eye lids MOUTH/THROAT: lips without  lesions,no lesions in the mouth,tongue is without lesions,uvula elevates in midline NECK: supple, trachea midline, no neck masses, no thyroid tenderness, no thyromegaly LYMPHATICS: no LAN in the neck, no supraclavicular LAN RESPIRATORY: breathing is even & unlabored, BS CTAB CARDIAC: RRR, no murmur,no extra heart sounds, no edema GI:  ABDOMEN: abdomen soft, normal BS, no masses, no tenderness  LIVER/SPLEEN: no hepatomegaly, no splenomegaly MUSCULOSKELETAL:  Unable to perform.   PSYCHIATRIC: the patient is alert, disoriented.  Decreased affect and mood.  LABS/RADIOLOGY: Glucose 100, otherwise BMP normal.    White count 12.5, hemoglobin 11.4, MCV 89.7, platelets 274.    Chest x-ray:  No acute disease.  ASSESSMENT/PLAN:  UTI 599.0.  Continue Ceftin.    Hypertension 401.1.  Well controlled.    Dementia (            ).  Advanced.    Protein  calorie malnutrition (            ).  Continue Ensure.   Migraines (            ).  Continue Topamax.    Nonsustained atrial fibrillation (            ).  The patient was started on lopressor.  She is not a candidate for anticoagulation.    V58.69.  Check CBC and BMP.  I have reviewed patient's medical records received from hospitalization.  CPT CODE: 29562.

## 2012-09-07 ENCOUNTER — Non-Acute Institutional Stay (SKILLED_NURSING_FACILITY): Payer: Medicare Other | Admitting: Internal Medicine

## 2012-09-07 DIAGNOSIS — R29810 Facial weakness: Secondary | ICD-10-CM

## 2012-09-07 DIAGNOSIS — R609 Edema, unspecified: Secondary | ICD-10-CM

## 2012-09-13 ENCOUNTER — Non-Acute Institutional Stay (SKILLED_NURSING_FACILITY): Payer: Medicare Other | Admitting: Adult Health

## 2012-09-13 DIAGNOSIS — E039 Hypothyroidism, unspecified: Secondary | ICD-10-CM

## 2012-09-13 NOTE — Consult Note (Signed)
Agree with above 

## 2012-09-14 ENCOUNTER — Non-Acute Institutional Stay (SKILLED_NURSING_FACILITY): Payer: Medicare Other | Admitting: Internal Medicine

## 2012-09-14 DIAGNOSIS — K219 Gastro-esophageal reflux disease without esophagitis: Secondary | ICD-10-CM

## 2012-09-14 DIAGNOSIS — F0390 Unspecified dementia without behavioral disturbance: Secondary | ICD-10-CM

## 2012-09-14 DIAGNOSIS — I10 Essential (primary) hypertension: Secondary | ICD-10-CM

## 2012-09-14 DIAGNOSIS — E039 Hypothyroidism, unspecified: Secondary | ICD-10-CM

## 2012-09-19 NOTE — Progress Notes (Signed)
Patient ID: Brandy [name], female   DOB: July 31, 1929, 79 y.o.   MRN: [id number]        PROGRESS NOTE  DATE:   09/07/2012  FACILITY:  [facility]   LEVEL OF CARE: SNF  Acute Visit  CHIEF COMPLAINT:  Manage lower extremity swelling and drooping on right side.    HISTORY OF PRESENT ILLNESS: I was requested by the staff to assess the patient regarding above problem(s):  Lower extremity swelling:  Staff report that patient has bilateral lower extremity swelling, noted since 09/04/2012.  Patient overall is a poor historian due to dementia.  Staff cannot identify precipitating or alleviating factors.  Symptoms are constant.  There are no other associated signs and symptoms.   Right-sided droop:  Family is concerned about droop on right side, but staff do not report any changes since admission.   No slurry speech reported.  Head CT in the hospitalization on 08/13/2012 was negative for acute findings.    PAST MEDICAL HISTORY : Reviewed.  No changes.  CURRENT MEDICATIONS: Reviewed per [facility]  REVIEW OF SYSTEMS:  Unobtainable due to dementia.   PHYSICAL EXAMINATION  VS:  T        P 88      RR 18      BP 110/70     POX %       WT (Lb)  GENERAL: no acute distress, moderately obese body habitus RESPIRATORY: breathing is even & unlabored, BS CTAB CARDIAC: RRR, no murmur,no extra heart sounds EDEMA/VARICOSITIES:  +2 bilateral lower extremity edema  ARTERIAL:  pedal pulses nonpalpable  GI: abdomen soft, normal BS, no masses, no tenderness, no hepatomegaly, no splenomegaly PSYCHIATRIC: the patient is alert & oriented to person, decreased affect and mood   ASSESSMENT/PLAN:  Lower extremity edema.  Appears to be a new problem.  Likely dependent.  Check TSH.  I am hesitant to add a diuretic at this point since she is at risk of dehydration.   We will monitor.   Right-sided drooping.  No acute findings on the CT scan; and per staff, no changes noted since admission.  Palliative Care was recommended  in the hospital.  We will have Physical Therapy work with patient.    CPT CODE: 40981

## 2012-10-03 NOTE — Progress Notes (Signed)
Patient ID: Brandy [name], female   DOB: 01/13/1930, 79 y.o.   MRN: [id number]        PROGRESS NOTE  DATE:  09/14/2012  FACILITY: [facility]   LEVEL OF CARE: SNF  Routine Visit  CHIEF COMPLAINT:  Manage dementia, hypertension and GERD.   HISTORY OF PRESENT ILLNESS:  REASSESSMENT OF ONGOING PROBLEM(S):  DEMENTIA: The dementia remains stable and continues to function adequately in the current living environment with supervision.  The patient has had little changes in behavior. No complications noted from the medications presently being used.  Patient is a poor historian.   HTN: Pt 's HTN remains stable.  Staff Deny CP, sob, DOE, pedal edema, headaches, dizziness or visual disturbances.  No complications from the medications currently being used.  Last BP :  130/80.    GERD: pt's GERD is stable.  Staff Deny ongoing heartburn, abd. Pain, nausea or vomiting.  Currently on a PPI & tolerates it without any adverse reactions.   PAST MEDICAL HISTORY : Reviewed.  No changes.  CURRENT MEDICATIONS: Reviewed per [facility]  REVIEW OF SYSTEMS:  Difficult to obtain due to dementia.    PHYSICAL EXAMINATION  VS:  T   99    P 76     RR 20     BP 130/80     POX %     WT (Lb) 160  GENERAL: no acute distress, moderately obese body habitus EYES: conjunctivae normal, sclerae normal, normal eye lids NECK: supple, trachea midline, no neck masses, no thyroid tenderness, no thyromegaly LYMPHATICS: no LAN in the neck, no supraclavicular LAN RESPIRATORY: breathing is even & unlabored, BS CTAB CARDIAC: RRR, no murmur,no extra heart sounds  EDEMA/VARICOSITIES:  +1 bilateral lower extremity edema  ARTERIAL:  pedal pulses diminished  GI: abdomen soft, normal BS, no masses, no tenderness, no hepatomegaly, no splenomegaly PSYCHIATRIC: the patient is alert & oriented to person, affect & behavior appropriate  LABS/RADIOLOGY: 07/2012:  TSH 6.65.    Hemoglobin 10.8, MCV 91, otherwise CBC normal.   BMP normal.    ASSESSMENT/PLAN:  Hypertension.  Well controlled.   Dementia.  Advanced.   GERD.  Well controlled.    Hypothyroidism.  New problem.  Synthroid was initiated but, since TSH is less than 10, we will discontinue Synthroid.  Recheck TSH in eight weeks.    Depression.  Continue current antidepressants.   Anemia of chronic disease.  Stable.    CPT CODE: 46962

## 2012-10-10 ENCOUNTER — Non-Acute Institutional Stay (SKILLED_NURSING_FACILITY): Payer: Medicare Other | Admitting: Internal Medicine

## 2012-10-10 DIAGNOSIS — M7989 Other specified soft tissue disorders: Secondary | ICD-10-CM

## 2012-10-10 DIAGNOSIS — E039 Hypothyroidism, unspecified: Secondary | ICD-10-CM

## 2012-10-15 ENCOUNTER — Non-Acute Institutional Stay (SKILLED_NURSING_FACILITY): Payer: Medicare Other | Admitting: Internal Medicine

## 2012-10-15 DIAGNOSIS — M7989 Other specified soft tissue disorders: Secondary | ICD-10-CM

## 2012-10-15 DIAGNOSIS — M25579 Pain in unspecified ankle and joints of unspecified foot: Secondary | ICD-10-CM

## 2012-10-15 DIAGNOSIS — M25572 Pain in left ankle and joints of left foot: Secondary | ICD-10-CM

## 2012-10-17 ENCOUNTER — Non-Acute Institutional Stay (SKILLED_NURSING_FACILITY): Payer: Medicare Other | Admitting: Internal Medicine

## 2012-10-17 DIAGNOSIS — K219 Gastro-esophageal reflux disease without esophagitis: Secondary | ICD-10-CM

## 2012-10-17 DIAGNOSIS — I10 Essential (primary) hypertension: Secondary | ICD-10-CM

## 2012-10-17 DIAGNOSIS — F0390 Unspecified dementia without behavioral disturbance: Secondary | ICD-10-CM

## 2012-10-17 DIAGNOSIS — E039 Hypothyroidism, unspecified: Secondary | ICD-10-CM

## 2012-10-18 DIAGNOSIS — F0390 Unspecified dementia without behavioral disturbance: Secondary | ICD-10-CM | POA: Insufficient documentation

## 2012-10-18 DIAGNOSIS — E039 Hypothyroidism, unspecified: Secondary | ICD-10-CM | POA: Insufficient documentation

## 2012-10-18 DIAGNOSIS — I10 Essential (primary) hypertension: Secondary | ICD-10-CM | POA: Insufficient documentation

## 2012-10-18 NOTE — Progress Notes (Signed)
Patient ID: Brandy [name], female   DOB: [birth year], 79 y.o.   MRN: [id number]        PROGRESS NOTE  DATE:  10/17/2012  FACILITY: [facility]   LEVEL OF CARE: SNF  Routine Visit  CHIEF COMPLAINT:  Manage dementia, hypertension and GERD.   HISTORY OF PRESENT ILLNESS:  REASSESSMENT OF ONGOING PROBLEM(S):  DEMENTIA: The dementia remains stable and continues to function adequately in the current living environment with supervision.  The patient has had little changes in behavior. No complications noted from the medications presently being used.  Patient is a poor historian.   HTN: Pt 's HTN remains stable.  Staff Deny CP, sob, DOE, pedal edema, headaches, dizziness or visual disturbances.  No complications from the medications currently being used.  Last BP : 130/70,  130/80.    GERD: pt's GERD is stable.  Staff Deny ongoing heartburn, abd. Pain, nausea or vomiting.  Currently on a PPI & tolerates it without any adverse reactions.   PAST MEDICAL HISTORY : Reviewed.  No changes.  CURRENT MEDICATIONS: Reviewed per [facility]  REVIEW OF SYSTEMS:  Difficult to obtain due to dementia.    PHYSICAL EXAMINATION  VS:  T   98    P 86     RR 18     BP 130/70     POX %     WT (Lb) 165  GENERAL: no acute distress, moderately obese body habitus EYES: conjunctivae normal, sclerae normal, normal eye lids NECK: supple, trachea midline, no neck masses, no thyroid tenderness, no thyromegaly LYMPHATICS: no LAN in the neck, no supraclavicular LAN RESPIRATORY: breathing is even & unlabored, BS CTAB CARDIAC: RRR, no murmur,no extra heart sounds  EDEMA/VARICOSITIES:  +1 right lower extremity edema and +2 edema in the left lower extremity ARTERIAL:  pedal pulses diminished  GI: abdomen soft, normal BS, no masses, no tenderness, no hepatomegaly, no splenomegaly PSYCHIATRIC: the patient is alert & oriented to person, affect & behavior appropriate  LABS/RADIOLOGY:  5/14 total protein 5.7, albumin 3.3  otherwise liver profile normal, BNP 41.1 4/14 TSH 4.64, hemoglobin 10.8, MCV 91 otherwise CBC normal, BMP normal 07/2012:  TSH 6.65.    Hemoglobin 10.8, MCV 91, otherwise CBC normal.   BMP normal.   ASSESSMENT/PLAN:  Hypertension.  Well controlled.   Dementia.  Advanced.   GERD.  Well controlled.    Hypothyroidism.  New problem.  Synthroid was initiated but, since TSH is less than 10, we will discontinue Synthroid.  Recheck TSH in 4 weeks.    Depression.  Continue current antidepressants.   Anemia of chronic disease.  Stable.   Edema -- negative workup.  Left lower extremity venous Doppler ultrasound negative for DVT as well. Husband declined cardiology consult. 2 echo pending.  Constipation-Senokot was started.  Check Depakote level and hemoglobin A1c.   CPT CODE: 40981

## 2012-10-26 ENCOUNTER — Other Ambulatory Visit (HOSPITAL_BASED_OUTPATIENT_CLINIC_OR_DEPARTMENT_OTHER): Payer: Self-pay | Admitting: Internal Medicine

## 2012-10-26 ENCOUNTER — Non-Acute Institutional Stay (SKILLED_NURSING_FACILITY): Payer: Medicare Other | Admitting: Internal Medicine

## 2012-10-26 ENCOUNTER — Ambulatory Visit (HOSPITAL_COMMUNITY)
Admission: RE | Admit: 2012-10-26 | Discharge: 2012-10-26 | Disposition: A | Discharge status: Home / Self Care | Payer: Medicare Other | Source: Ambulatory Visit | Attending: Internal Medicine | Admitting: Internal Medicine

## 2012-10-26 DIAGNOSIS — R569 Unspecified convulsions: Secondary | ICD-10-CM

## 2012-10-26 DIAGNOSIS — R5381 Other malaise: Secondary | ICD-10-CM | POA: Insufficient documentation

## 2012-10-26 DIAGNOSIS — F0390 Unspecified dementia without behavioral disturbance: Secondary | ICD-10-CM | POA: Insufficient documentation

## 2012-10-26 DIAGNOSIS — I739 Peripheral vascular disease, unspecified: Secondary | ICD-10-CM | POA: Insufficient documentation

## 2012-10-26 DIAGNOSIS — G319 Degenerative disease of nervous system, unspecified: Secondary | ICD-10-CM | POA: Insufficient documentation

## 2012-10-26 DIAGNOSIS — R5383 Other fatigue: Secondary | ICD-10-CM | POA: Insufficient documentation

## 2012-10-26 NOTE — Progress Notes (Signed)
Patient ID: Brandy [name], female   DOB: [birth year], 79 y.o.   MRN: [id number] Chief complaint; seizure new-onset  History; this is a patient who has been near since March of this year. At that point. She was admitted with acute delirium, secondary to an Escherichia coli. The delirium. Heart is clear and we are now dealing with baseline dementia. She had a CT scan on 08/13/2012 showed no acute intracranial abnormality. She has apparently had an MRI in January of 2012. Also, room that showed diffuse atrophy and moderate small vessel disease. She was observed today by the nursing staff to have what sounds like to ice convulsions involving her head and upper torso while eating in the cafeteria. She was brought back to her room by which time the shaking stopped and she seems to have woken up  and is now  Her conversational self. Her vital signs are stable. The situation is complicated by the fact that she had 2 falls one 48 hours ago and one yesterday, although she did not have an obvious history of head trauma and there was no loss of consciousness. There is no history of seizure disorder.  Past medical history; essentially as stated above.  Medications Namenda 5 mg twice a day, Allegra 180 mg daily, Aricept 10 mg each bedtime, Zoloft 150 mg every morning, Carafate, 600/42 tablets daily, Topamax, 25 mg by mouth daily, Depakote, 250 every 12h, the Depakote and Topamax. I believe, are for behavioral issues, not seizures.  Review of system; Gen. patient does not have any obvious complaints. HEENT no headache. Respiratory complains of shortness of breath without cough. Cardiac no complaints of chest pain.  On examination, O2 sat 92% on room air. Blood pressure 100/70, pulse 78, respirations 16. Temperature is 98.9. Gen. she is awake, alert, does not appear to be any distress. Respiratory clear and bilaterally. Cardiac heart sounds are normal. She does not appear to be dehydrated  Abdomen no liver no  spleen no tenderness. Neurologic extraocular movements are normal, cranial nerves 5,7, 2 also appear to be normal. Strength is equal bilaterally. Reflexes symmetric HEENT saxon palpation of the scalp reveals no overt injuries.  Impression/plan #1 new seizure disorder? After discussing this with the staff were present. This seems most likely. She did not have any overt postictal phenomenon. But she was incontinent. A careful and neurologic exam at the bedside reveals no abnormality. I think the patient is largely stable, the recent fall, history he is a concern although there was no overt trauma and she is not on any and anticoagulants or any platelet drugs. She will require an outpatient. CT scan had electrolyte. For now. I am comfortable trying to keep her here rather than sending her to the emergency room. Will review her before I leave later today

## 2012-10-31 NOTE — Progress Notes (Signed)
Patient ID: Brandy [name], female   DOB: 1930-04-27, 79 y.o.   MRN: [id number]        PROGRESS NOTE  DATE: 10/10/2012  FACILITY:  [facility]  LEVEL OF CARE:    Acute Visit  CHIEF COMPLAINT:  Manage lower extremity swelling.    HISTORY OF PRESENT ILLNESS: I was requested by the staff to assess the patient regarding above problem(s):  The patient's daughter is very concerned that the patient has worsening lower extremity swelling.  Per daughter, she did not have any swelling in her lower extremities in hospitalization.  The onset of swelling was in this facility and it has been worsening.  Patient overall is a poor historian due to dementia.  Daughter cannot identify precipitating or alleviating factors.  There is no temporal relationship.  There are no other associated signs and symptoms.   Per staff, leg elevation improves the edema.    PAST MEDICAL HISTORY : Reviewed.  No changes.  CURRENT MEDICATIONS: Reviewed per [facility]  REVIEW OF SYSTEMS:  Unobtainable due to dementia.  PHYSICAL EXAMINATION  VS:  T        P       RR       BP 118/74     POX %       WT (Lb) 165  GENERAL: no acute distress, normal body habitus EYES: conjunctivae normal, sclerae normal, normal eye lids NECK: supple, trachea midline, no neck masses, no thyroid tenderness, no thyromegaly LYMPHATICS: no LAN in the neck, no supraclavicular LAN RESPIRATORY: breathing is even & unlabored, BS CTAB CARDIAC: RRR, no murmur,no extra heart sounds EDEMA/VARICOSITIES:  +2 bilateral lower extremity pitting edema  ARTERIAL:  pedal pulses nonpalpable  GI: abdomen soft, normal BS, no masses, no tenderness, no hepatomegaly, no splenomegaly PSYCHIATRIC: the patient is alert & oriented to person, affect & behavior appropriate  LABS/RADIOLOGY: 09/25/2012:  TSH 4.64.    09/04/2012:  BNP normal.   In the hospital:  Albumin 2.1, AST 61, ALT 53.    ASSESSMENT/PLAN:  Bilateral lower extremity edema 782.3.   Worsening problem.  Likely dependent edema plus chronic venous insufficiency.  I am hesitant to initiate any diuretic with the risk of renal failure and dehydration.  Also, patient's blood pressure is on the lower side.  Since the daughter is requesting further evaluation, we will request a Cardiology consult.  Explained the reason for the lower extremity edema, differential diagnoses, the work-up, and my hesitancy to initiate diuretics to the daughter.  Patient's weight has not changed.  Hypothyroidism.   New problem.  Recheck TSH in eight weeks.    Elevated liver functions 790.5.  Recheck.    Total patient care time:  Greater than 25 minutes which included bedside discussion with patient's daughter on lower extremity edema.    CPT CODE: 44010

## 2012-11-02 NOTE — Progress Notes (Signed)
Patient ID: Brandy [name], female   DOB: May 14, 1930, 79 y.o.   MRN: [id number]        PROGRESS NOTE  DATE: 10/15/2012  FACILITY:  [facility]  LEVEL OF CARE: SNF (31)  Acute Visit  CHIEF COMPLAINT:  Manage increased left lower extremity swelling and pain.    HISTORY OF PRESENT ILLNESS: I was requested by the staff to assess the patient regarding above problem(s):  Staff report that patient is unable to bear weight on her left lower extremity and her edema is worse than the right lower extremity.  Patient is also having increased pain in the left ankle area.  There is also redness noted today.  Patient does admit to pain.  She denies chest pain, shortness of breath.  Overall, patient is a poor historian.     PAST MEDICAL HISTORY : Reviewed.  No changes.  CURRENT MEDICATIONS: Reviewed per [facility]  REVIEW OF SYSTEMS:  Difficult to obtain.  Patient is a poor historian.    PHYSICAL EXAMINATION  VS:  T 98.8       P 74     RR 18      BP 120/70      POX 98% room air      WT (Lb)  GENERAL: no acute distress, normal body habitus SKIN:  INSPECTION: there is mild erythema at the ankle of the left lower extremity  EYES: conjunctivae normal, sclerae normal, normal eye lids NECK: supple, trachea midline, no neck masses, no thyroid tenderness, no thyromegaly LYMPHATICS: no LAN in the neck, no supraclavicular LAN RESPIRATORY: breathing is even & unlabored, BS CTAB CARDIAC: RRR, no murmur,no extra heart sounds EDEMA/VARICOSITIES:  left lower extremity has +2 edema, right lower extremity has +1 edema ARTERIAL:  pedal pulses nonpalpable  GI: abdomen soft, normal BS, no masses, no tenderness, no hepatomegaly, no splenomegaly MUSCULOSKELETAL:   EXTREMITIES:   LEFT LOWER EXTREMITY: no calf tenderness, there is exquisite tenderness in the left ankle to palpation PSYCHIATRIC: the patient is alert & oriented to person, affect & behavior appropriate  ASSESSMENT/PLAN:  Increased left  lower extremity edema.    Left ankle pain.    New onset.  Significant problems.  Obtain left lower extremity venous doppler ultrasound and x-ray.    CPT CODE: 40981

## 2012-11-14 ENCOUNTER — Non-Acute Institutional Stay (SKILLED_NURSING_FACILITY): Payer: Medicare Other | Admitting: Internal Medicine

## 2012-11-14 DIAGNOSIS — I10 Essential (primary) hypertension: Secondary | ICD-10-CM

## 2012-11-14 DIAGNOSIS — K219 Gastro-esophageal reflux disease without esophagitis: Secondary | ICD-10-CM

## 2012-11-14 DIAGNOSIS — E039 Hypothyroidism, unspecified: Secondary | ICD-10-CM

## 2012-11-14 DIAGNOSIS — F0390 Unspecified dementia without behavioral disturbance: Secondary | ICD-10-CM

## 2012-11-16 NOTE — Progress Notes (Signed)
Patient ID: Brandy [name], female   DOB: Dec 18, 1929, 79 y.o.   MRN: [id number]        PROGRESS NOTE  DATE:  11/14/2012  [facility]AC[facility]   LEVEL OF CARE: SNF  Routine Visit  CHIEF COMPLAINT:  Manage dementia, hypertension and GERD.   HISTORY OF PRESENT ILLNESS:  REASSESSMENT OF ONGOING PROBLEM(S):  DEMENTIA: The dementia remains stable and continues to function adequately in the current living environment with supervision.  The patient has had little changes in behavior. No complications noted from the medications presently being used.  Patient is a poor historian.   HTN: Pt 's HTN remains stable.  Staff Deny CP, sob, DOE, pedal edema, headaches, dizziness or visual disturbances.  No complications from the medications currently being used.  Last BP : 100/64, 130/70,  130/80.    GERD: pt's GERD is stable.  Staff Deny ongoing heartburn, abd. Pain, nausea or vomiting.  Currently on a PPI & tolerates it without any adverse reactions.   PAST MEDICAL HISTORY : Reviewed.  No changes.  CURRENT MEDICATIONS: Reviewed per [facility]  REVIEW OF SYSTEMS:  Difficult to obtain due to dementia.    PHYSICAL EXAMINATION  VS:  T   98    P 86     RR 18     BP 130/70     POX %     WT (Lb) 165  GENERAL: no acute distress, moderately obese body habitus EYES: conjunctivae normal, sclerae normal, normal eye lids NECK: supple, trachea midline, no neck masses, no thyroid tenderness, no thyromegaly LYMPHATICS: no LAN in the neck, no supraclavicular LAN RESPIRATORY: breathing is even & unlabored, BS CTAB CARDIAC: RRR, no murmur,no extra heart sounds  EDEMA/VARICOSITIES:  +1 right lower extremity edema and +2 edema in the left lower extremity ARTERIAL:  pedal pulses diminished  GI: abdomen soft, normal BS, no masses, no tenderness, no hepatomegaly, no splenomegaly PSYCHIATRIC: the patient is alert & oriented to person, affect & behavior appropriate  LABS/RADIOLOGY:  5/14 total protein 5.7, albumin 3.3  otherwise liver profile normal, BNP 41.1, CBC normal, albumin 3.4 otherwise CMP normal, hemoglobin A1c 5.4, Depakote level 30 4/14 TSH 4.64, hemoglobin 10.8, MCV 91 otherwise CBC normal, BMP normal 07/2012:  TSH 6.65.    Hemoglobin 10.8, MCV 91, otherwise CBC normal.   BMP normal.   ASSESSMENT/PLAN:  Hypertension.  Well controlled.   Dementia.  Advanced.   GERD.  Well controlled.    Hypothyroidism.  Recheck TSH in 4.  Depression.  Continue current antidepressants.   Anemia of chronic disease.  Stable.   Edema -- stable.  Constipation-adequately controlled.  CPT CODE: 14782

## 2012-11-21 ENCOUNTER — Non-Acute Institutional Stay: Payer: Medicare Other | Admitting: Internal Medicine

## 2012-11-21 DIAGNOSIS — E039 Hypothyroidism, unspecified: Secondary | ICD-10-CM

## 2012-12-12 ENCOUNTER — Non-Acute Institutional Stay (SKILLED_NURSING_FACILITY): Payer: Medicare Other | Admitting: Internal Medicine

## 2012-12-12 DIAGNOSIS — F0390 Unspecified dementia without behavioral disturbance: Secondary | ICD-10-CM

## 2012-12-12 DIAGNOSIS — E039 Hypothyroidism, unspecified: Secondary | ICD-10-CM

## 2012-12-12 DIAGNOSIS — K219 Gastro-esophageal reflux disease without esophagitis: Secondary | ICD-10-CM

## 2012-12-12 DIAGNOSIS — I10 Essential (primary) hypertension: Secondary | ICD-10-CM

## 2012-12-13 NOTE — Progress Notes (Signed)
Patient ID: QUETZALLY CALLAS, female   DOB: 1929-12-30, 79 y.o.   MRN: [id number]        PROGRESS NOTE  DATE: 11/21/2012  FACILITY:  [facility]  LEVEL OF CARE: SNF (31)  Acute Visit  CHIEF COMPLAINT:  Manage hypothyroidism.    HISTORY OF PRESENT ILLNESS: I was requested by the staff to assess the patient regarding above problem(s):  HYPOTHYROIDISM: On 11/20/2012:  TSH 7.59.  The patient is currently not on levothyroxine.  She is a poor historian due to dementia.     PAST MEDICAL HISTORY : Reviewed.  No changes.  CURRENT MEDICATIONS: Reviewed per [facility]  REVIEW OF SYSTEMS:  Unobtainable due to dementia.   PHYSICAL EXAMINATION  GENERAL: no acute distress, normal body habitus NECK: supple, trachea midline, no neck masses, no thyroid tenderness, no thyromegaly RESPIRATORY: breathing is even & unlabored, BS CTAB CARDIAC: RRR, no murmur,no extra heart sounds EDEMA/VARICOSITIES:  +1 bilateral lower extremity edema, left greater than right  GI: abdomen soft, normal BS, no masses, no tenderness, no hepatomegaly, no splenomegaly PSYCHIATRIC: the patient is alert & oriented to person, affect & behavior appropriate  ASSESSMENT/PLAN:  Hypothyroidism 244.9.  New problem.  Start levothyroxine 25 mcg q.d.  Check TSH in six weeks.    CPT CODE: 40981

## 2012-12-14 NOTE — Progress Notes (Signed)
Patient ID: Brandy [name], female   DOB: 04-13-1930, 79 y.o.   MRN: [id number]        PROGRESS NOTE  DATE:  12/12/2012  [facility]ACILITY: [facility]   LEVEL OF CARE: SNF  Routine Visit  CHIEF COMPLAINT:  Manage hypothyroidism, dementia, hypertension and GERD.   HISTORY OF PRESENT ILLNESS:  REASSESSMENT OF ONGOING PROBLEM(S):  HYPOTHYROIDISM: The hypothyroidism remains stable. No complications noted from the medications presently being used.  The patient denies fatigue or constipation.  Last TSH 5.03 on 12/05/12.  DEMENTIA: The dementia remains stable and continues to function adequately in the current living environment with supervision.  The patient has had little changes in behavior. No complications noted from the medications presently being used.  Patient is a poor historian.   HTN: Pt 's HTN remains stable.  Staff Deny CP, sob, DOE, pedal edema, headaches, dizziness or visual disturbances.  No complications from the medications currently being used.  Last BP : 124/64, 100/64, 130/70,  130/80.    GERD: pt's GERD is stable.  Staff Deny ongoing heartburn, abd. Pain, nausea or vomiting.  Currently on a PPI & tolerates it without any adverse reactions.   PAST MEDICAL HISTORY : Reviewed.  No changes.  CURRENT MEDICATIONS: Reviewed per [facility]  REVIEW OF SYSTEMS:  Difficult to obtain due to dementia.    PHYSICAL EXAMINATION  VS:  T   98.4    P 70     RR 18     BP 124/64     POX %     WT (Lb) 168  GENERAL: no acute distress, moderately obese body habitus EYES: conjunctivae normal, sclerae normal, normal eye lids NECK: supple, trachea midline, no neck masses, no thyroid tenderness, no thyromegaly LYMPHATICS: no LAN in the neck, no supraclavicular LAN RESPIRATORY: breathing is even & unlabored, BS CTAB CARDIAC: RRR, no murmur,no extra heart sounds  EDEMA/VARICOSITIES:  +1 right lower extremity edema and +2 edema in the left lower extremity ARTERIAL:  pedal pulses diminished  GI: abdomen  soft, normal BS, no masses, no tenderness, no hepatomegaly, no splenomegaly PSYCHIATRIC: the patient is alert & oriented to person, affect & behavior appropriate  LABS/RADIOLOGY:  5/14 total protein 5.7, albumin 3.3 otherwise liver profile normal, BNP 41.1, CBC normal, albumin 3.4 otherwise CMP normal, hemoglobin A1c 5.4, Depakote level 30 4/14 TSH 4.64, hemoglobin 10.8, MCV 91 otherwise CBC normal, BMP normal 07/2012:  TSH 6.65.    Hemoglobin 10.8, MCV 91, otherwise CBC normal.   BMP normal.   ASSESSMENT/PLAN:  Hypertension.  Well controlled.   Dementia.  Advanced.   GERD.  Well controlled.    Hypothyroidism.TSH improved.  Cont. Current synthroid dose.  Depression.  Continue current antidepressants.   Anemia of chronic disease.  Stable.   Edema -- stable.  Constipation-adequately controlled.  CPT CODE: 40981

## 2012-12-24 DIAGNOSIS — F29 Unspecified psychosis not due to a substance or known physiological condition: Secondary | ICD-10-CM | POA: Insufficient documentation

## 2012-12-24 NOTE — Assessment & Plan Note (Signed)
Will continue her zoloft 100 mg in the am an 50 mg in the pm and will monitor

## 2012-12-24 NOTE — Progress Notes (Signed)
Patient ID: Brandy [name], female   DOB: [birth year], 79 y.o.   MRN: [id number]  [facility]  Allergies  Allergen Reactions  . Other     CRANBERRY JUICE  . Sulfonamide Derivatives     REACTION: Can't remember     Chief Complaint  Patient presents with  . Acute Visit    family concerns    HPI: She has been hospitalized for metabolic encephalitis and uti. She does have dementia she is  having hallucinations which are not causing her any type of anxiety or agitation. Her family states she was not on an antipsychotic medication prior to her hospitalization. She is here for short term rehab however more than likely this does represent a long term placement for her.   Past Medical History  Diagnosis Date  . Anxiety   . Depression   . Dementia     neuro eval WNL, normal MRI, eval at behavioral [facility] center after admission with dx mild-mod dementia  . HTN (hypertension)   . HLD (hyperlipidemia)   . Allergic rhinitis   . OSA (obstructive sleep apnea)     intermittent CPAP use  . Migraine   . Arthritis   . Herpes zoster 12/29/2006  . Urinary incontinence     Past Surgical History  Procedure Laterality Date  . Appendectomy    . Neck surgery      CALCIUM DEPOSITS  . Abdominal hysterectomy      TAH for fibroids    VITAL SIGNS BP 130/60  Pulse 88  Ht 5\' 4"  (1.626 m)  Wt 162 lb (73.483 kg)  BMI 27.79 kg/m2   Patient's Medications  New Prescriptions   No medications on file  Previous Medications   BENZTROPINE (COGENTIN) 1 MG TABLET    Take 0.5 tablets (0.5 mg total) by mouth daily.   BUPROPION (WELLBUTRIN) 75 MG TABLET    Take 1 tablet (75 mg total) by mouth 2 (two) times daily.   CALCIUM CARBONATE-VITAMIN D (CALCIUM 600+D) 600-400 MG-UNIT PER TABLET    Take 2 tablets by mouth daily. For bones   DIVALPROEX (DEPAKOTE SPRINKLE) 125 MG CAPSULE    Take 2 capsules (250 mg total) by mouth every 12 (twelve) hours.   DONEPEZIL (ARICEPT) 10 MG TABLET    Take 1 tablet (10 mg  total) by mouth at bedtime.   FEEDING SUPPLEMENT (ENSURE COMPLETE) LIQD    Take 237 mLs by mouth 2 (two) times daily between meals.   FEXOFENADINE (ALLEGRA) 180 MG TABLET    Take 180 mg by mouth daily. As needed for allergies   MEMANTINE (NAMENDA) 5 MG TABLET    Take 5 mg by mouth 2 (two) times daily.   METOPROLOL SUCCINATE (TOPROL-XL) 12.5 MG TB24    Take 0.5 tablets (12.5 mg total) by mouth daily.   MULTIPLE VITAMIN (MULTIVITAMIN) TABLET    Take 1 tablet by mouth daily.     OMEPRAZOLE (PRILOSEC) 40 MG CAPSULE    Take 1 capsule (40 mg total) by mouth daily. For reflux   RISPERIDONE (RISPERDAL) 0.5 MG TABLET    Take 0.5 mg by mouth 2 (two) times daily.   SERTRALINE (ZOLOFT) 50 MG TABLET    Take 50-100 mg by mouth 2 (two) times daily. Take 2 tabs in the morning and 1 tab in the evening   TOPIRAMATE (TOPAMAX) 25 MG TABLET    Take 1 tablet (25 mg total) by mouth daily.  Modified Medications   No medications on file  Discontinued  Medications   No medications on file    SIGNIFICANT DIAGNOSTIC EXAMS     Component Value Date/Time   ALBUMIN 2.5* 08/13/2012 1052   AST 61* 08/13/2012 1052   ALT 53* 08/13/2012 1052   ALKPHOS 97 08/13/2012 1052   BILITOT 0.4 08/13/2012 1052       Component Value Date/Time   BUN 20 08/19/2012 0600   GLUCOSE 100* 08/19/2012 0600   CREATININE 0.64 08/19/2012 0600   CREATININE 0.73 04/27/2012 1710   K 3.8 08/19/2012 0600   NA 139 08/19/2012 0600   TSH 1.516 08/13/2012 1726       Component Value Date/Time   WBC 12.5* 08/19/2012 0600   RBC 3.80* 08/19/2012 0600   HGB 11.4* 08/19/2012 0600   HCT 34.1* 08/19/2012 0600   PLT 274 08/19/2012 0600   MCV 89.7 08/19/2012 0600     Review of Systems  Unable to perform ROS  Physical Exam  Constitutional: She appears well-developed and well-nourished.  Neck: Normal range of motion. Neck supple. No JVD present. No thyromegaly present.  Cardiovascular: Normal rate, regular rhythm and intact distal pulses.   Respiratory:  Effort normal and breath sounds normal. No respiratory distress. She has no wheezes.  GI: Soft. Bowel sounds are normal. She exhibits no distension. There is no tenderness.  Musculoskeletal: Normal range of motion. She exhibits no edema.  Neurological: She is alert.  Skin: Skin is warm and dry.     ASSESSMENT/ PLAN:  ANXIETY DEPRESSION Will continue her zoloft 100 mg in the am an 50 mg in the pm and will monitor  COMMON MIGRAINE Will continue topamax 25 mg daily and will monitor  Dementia Will continue aricept 10 mg daily and namenda 5 mg twice daily and will monitor   GERD Will continue prilosec 20 mg daily  ALLERGIC RHINITIS Will continue allegra 180 mg daily as needed   HYPERTENSION Will continue toprol xl 12.5 daily and will monitor  Psychosis Per her family she was not on risperdal prior to her hospitalization; will lower her risperdal to 0.5 mg twice daily for 3 days then 0. 5 Mg nightly for 3 days then stop will stop her cogentin when off the risperdal and will monitor her status will continue depakote sprinkles 250 mg twice daily to help stabilize mood  UTI (lower urinary tract infection) Will complete her ceftin and  Will monitor     Time spent with patient: 50 minutes

## 2012-12-24 NOTE — Assessment & Plan Note (Addendum)
Will continue topamax 25 mg daily and will monitor

## 2012-12-24 NOTE — Assessment & Plan Note (Addendum)
Per her family she was not on risperdal prior to her hospitalization; will lower her risperdal to 0.5 mg twice daily for 3 days then 0. 5 Mg nightly for 3 days then stop will stop her cogentin when off the risperdal and will monitor her status will continue depakote sprinkles 250 mg twice daily to help stabilize mood

## 2012-12-24 NOTE — Assessment & Plan Note (Signed)
Will continue aricept 10 mg daily and namenda 5 mg twice daily and will monitor

## 2012-12-24 NOTE — Assessment & Plan Note (Signed)
Will continue prilosec 20 mg daily

## 2012-12-24 NOTE — Assessment & Plan Note (Signed)
Will continue allegra 180 mg daily as needed

## 2012-12-24 NOTE — Assessment & Plan Note (Signed)
Will continue toprol xl 12.5 daily and will monitor

## 2012-12-24 NOTE — Assessment & Plan Note (Signed)
Will complete her ceftin and  Will monitor

## 2012-12-27 ENCOUNTER — Non-Acute Institutional Stay (SKILLED_NURSING_FACILITY): Payer: Medicare Other | Admitting: Internal Medicine

## 2012-12-27 DIAGNOSIS — N390 Urinary tract infection, site not specified: Secondary | ICD-10-CM

## 2012-12-31 ENCOUNTER — Non-Acute Institutional Stay (SKILLED_NURSING_FACILITY): Payer: Medicare Other | Admitting: Adult Health

## 2012-12-31 DIAGNOSIS — N390 Urinary tract infection, site not specified: Secondary | ICD-10-CM

## 2012-12-31 DIAGNOSIS — J069 Acute upper respiratory infection, unspecified: Secondary | ICD-10-CM

## 2013-01-09 ENCOUNTER — Non-Acute Institutional Stay (SKILLED_NURSING_FACILITY): Payer: Medicare Other | Admitting: Internal Medicine

## 2013-01-09 DIAGNOSIS — J309 Allergic rhinitis, unspecified: Secondary | ICD-10-CM

## 2013-01-17 ENCOUNTER — Non-Acute Institutional Stay (SKILLED_NURSING_FACILITY): Payer: Medicare Other | Admitting: Internal Medicine

## 2013-01-17 DIAGNOSIS — E039 Hypothyroidism, unspecified: Secondary | ICD-10-CM

## 2013-01-21 ENCOUNTER — Non-Acute Institutional Stay (SKILLED_NURSING_FACILITY): Payer: Medicare Other | Admitting: Internal Medicine

## 2013-01-21 DIAGNOSIS — I10 Essential (primary) hypertension: Secondary | ICD-10-CM

## 2013-01-21 DIAGNOSIS — E039 Hypothyroidism, unspecified: Secondary | ICD-10-CM

## 2013-01-21 DIAGNOSIS — K219 Gastro-esophageal reflux disease without esophagitis: Secondary | ICD-10-CM

## 2013-01-21 DIAGNOSIS — F0390 Unspecified dementia without behavioral disturbance: Secondary | ICD-10-CM

## 2013-01-21 NOTE — Progress Notes (Signed)
Patient ID: Brandy [name], female   DOB: 1930-04-29, 79 y.o.   MRN: [id number]        PROGRESS NOTE  DATE:  01/21/2013  FACILITY: [facility]   LEVEL OF CARE: SNF  Routine Visit  CHIEF COMPLAINT:  Manage hypothyroidism, dementia, hypertension and GERD.   HISTORY OF PRESENT ILLNESS:  REASSESSMENT OF ONGOING PROBLEM(S):  HYPOTHYROIDISM: The hypothyroidism remains stable. No complications noted from the medications presently being used.  The patient denies fatigue or constipation.  Last TSH 5.03 on 12/05/12, eating a/14 TSH 7.02.  DEMENTIA: The dementia remains stable and continues to function adequately in the current living environment with supervision.  The patient has had little changes in behavior. No complications noted from the medications presently being used.  Patient is a poor historian.   HTN: Pt 's HTN remains stable.  Staff Deny CP, sob, DOE, pedal edema, headaches, dizziness or visual disturbances.  No complications from the medications currently being used.  Last BP : 124/64, 100/64, 130/70,  130/80, 120/70   GERD: pt's GERD is stable.  Staff Deny ongoing heartburn, abd. Pain, nausea or vomiting.  Currently on a PPI & tolerates it without any adverse reactions.   PAST MEDICAL HISTORY : Reviewed.  No changes.  CURRENT MEDICATIONS: Reviewed per [facility]  REVIEW OF SYSTEMS:  Difficult to obtain due to dementia.    PHYSICAL EXAMINATION  VS:  T   97.8.    P 76     RR 18     BP 120/70     POX %     WT (Lb) 168  GENERAL: no acute distress, moderately obese body habitus NECK: supple, trachea midline, no neck masses, no thyroid tenderness, no thyromegaly RESPIRATORY: breathing is even & unlabored, BS CTAB CARDIAC: RRR, no murmur,no extra heart sounds  EDEMA/VARICOSITIES:  +1 right lower extremity edema and +2 edema in the left lower extremity ARTERIAL:  pedal pulses diminished  GI: abdomen soft, normal BS, no masses, no tenderness, no hepatomegaly, no splenomegaly PSYCHIATRIC:  the patient is alert & oriented to person, affect & behavior appropriate  LABS/RADIOLOGY:  5/14 total protein 5.7, albumin 3.3 otherwise liver profile normal, BNP 41.1, CBC normal, albumin 3.4 otherwise CMP normal, hemoglobin A1c 5.4, Depakote level 30 4/14 TSH 4.64, hemoglobin 10.8, MCV 91 otherwise CBC normal, BMP normal 07/2012:  TSH 6.65.    Hemoglobin 10.8, MCV 91, otherwise CBC normal.   BMP normal.   ASSESSMENT/PLAN:  Hypertension.  Well controlled.   Dementia.  Advanced.   GERD.  Well controlled.    Hypothyroidism.  uncontrolled. Levothyroxene was increased. Recheck TSH in 6 weeks is pending.  Depression.  Continue current antidepressants.   Anemia of chronic disease.  Stable.   Edema -- stable.  Constipation-adequately controlled.  Allergic rhinitis-Allegra was started  CPT CODE: 43329

## 2013-01-28 NOTE — Progress Notes (Signed)
Patient ID: Brandy [name], female   DOB: 20-Nov-1929, 79 y.o.   MRN: [id number]        PROGRESS NOTE  DATE: 12/27/2012  FACILITY:  [facility]  LEVEL OF CARE: SNF (31)  Acute Visit  CHIEF COMPLAINT:  Manage UTI.    HISTORY OF PRESENT ILLNESS: I was requested by the staff to assess the patient regarding above problem(s):  On 12/21/2012, patient's urine culture grew Proteus mirabilis significantly.  Patient denies suprapubic pain or flank pain.    PAST MEDICAL HISTORY : Reviewed.  No changes.  CURRENT MEDICATIONS: Reviewed per [facility]  REVIEW OF SYSTEMS:  GENERAL: no change in appetite, no fatigue, no weight changes, no fever, chills or weakness RESPIRATORY: no cough, SOB, DOE,, wheezing, hemoptysis CARDIAC: no chest pain, edema or palpitations GI: no abdominal pain, diarrhea, constipation, heart burn, nausea or vomiting  PHYSICAL EXAMINATION  GENERAL: no acute distress, moderately obese body habitus NECK: supple, trachea midline, no neck masses, no thyroid tenderness, no thyromegaly RESPIRATORY: breathing is even & unlabored, BS CTAB CARDIAC: RRR, no murmur,no extra heart sounds, no edema GI: abdomen soft, normal BS, no masses, no tenderness, no hepatomegaly, no splenomegaly PSYCHIATRIC: the patient is alert & oriented to person, affect & behavior appropriate  ASSESSMENT/PLAN:  UTI.  New problem.  Start Cipro 250 mg b.i.d. for one week and Florastor b.i.d. for one week.    CPT CODE: 40981

## 2013-01-30 NOTE — Progress Notes (Signed)
Patient ID: Brandy [name], female   DOB: [birth year], 79 y.o.   MRN: [id number]        PROGRESS NOTE  DATE: 01/09/2013  FACILITY:  [facility]  LEVEL OF CARE: SNF (31)  Acute Visit  CHIEF COMPLAINT:  Manage cough and runny nose.    HISTORY OF PRESENT ILLNESS: I was requested by the staff to assess the patient regarding above problem(s):  Staff report that patient continues to have a deep, nonproductive cough despite completion of Mucinex and Allegra for one week.  Patient is a poor historian due to dementia.  Staff also report constant runny nose.  Staff cannot identify precipitating or alleviating factors.  There is no temporal relationship.  There are no other associated signs and symptoms.  Chest x-ray on 01/07/2013 was negative for acute findings.    PAST MEDICAL HISTORY : Reviewed.  No changes.  CURRENT MEDICATIONS: Reviewed per [facility]  REVIEW OF SYSTEMS:  Unobtainable due to dementia.    PHYSICAL EXAMINATION  VS:  T 97.8       P 70      RR 18      BP 130/60     POX %       WT (Lb)  GENERAL: no acute distress, normal body habitus EYES: conjunctivae normal, sclerae normal, normal eye lids NECK: supple, trachea midline, no neck masses, no thyroid tenderness, no thyromegaly LYMPHATICS: no LAN in the neck, no supraclavicular LAN RESPIRATORY: breathing is even & unlabored, BS CTAB  ASSESSMENT/PLAN:  Allergic rhinitis.  New problem.  Start Allegra 180 mg q.d.    CPT CODE: 40981

## 2013-02-07 NOTE — Progress Notes (Signed)
Patient ID: Brandy [name], female   DOB: 05-May-1930, 79 y.o.   MRN: [id number]        PROGRESS NOTE  DATE: 01/17/2013  FACILITY:  [facility]  LEVEL OF CARE: SNF (31)  Acute Visit  CHIEF COMPLAINT:  Manage hypothyroidism.    HISTORY OF PRESENT ILLNESS: I was requested by the staff to assess the patient regarding above problem(s):  HYPOTHYROIDISM: The hypothyroidism is unstable. No complications noted from the medications presently being used.  The staff denies fatigue or constipation.  Last TSH:  On 01/16/2013:  TSH 7.02.    PAST MEDICAL HISTORY : Reviewed.  No changes.  CURRENT MEDICATIONS: Reviewed per [facility]  REVIEW OF SYSTEMS:  Unobtainable due to dementia.    PHYSICAL EXAMINATION  GENERAL: no acute distress, normal body habitus NECK: supple, trachea midline, no neck masses, no thyroid tenderness, no thyromegaly RESPIRATORY: breathing is even & unlabored, BS CTAB CARDIAC: RRR, no murmur,no extra heart sounds EDEMA/VARICOSITIES:  +1 bilateral lower extremity edema    GI: abdomen soft, normal BS, no masses, no tenderness, no hepatomegaly, no splenomegaly PSYCHIATRIC: the patient is alert & oriented to person, affect & behavior appropriate  ASSESSMENT/PLAN:  Hypothyroidism.   Uncontrolled problem.  Increase levothyroxine to 50 mcg q.d.  Check TSH in six weeks.    CPT CODE: 40981

## 2013-02-15 ENCOUNTER — Non-Acute Institutional Stay (SKILLED_NURSING_FACILITY): Payer: Medicare Other | Admitting: Internal Medicine

## 2013-02-15 DIAGNOSIS — I10 Essential (primary) hypertension: Secondary | ICD-10-CM

## 2013-02-15 DIAGNOSIS — F0390 Unspecified dementia without behavioral disturbance: Secondary | ICD-10-CM

## 2013-02-15 DIAGNOSIS — K219 Gastro-esophageal reflux disease without esophagitis: Secondary | ICD-10-CM

## 2013-02-15 DIAGNOSIS — E039 Hypothyroidism, unspecified: Secondary | ICD-10-CM

## 2013-02-15 DIAGNOSIS — R32 Unspecified urinary incontinence: Secondary | ICD-10-CM

## 2013-02-16 DIAGNOSIS — R32 Unspecified urinary incontinence: Secondary | ICD-10-CM | POA: Insufficient documentation

## 2013-02-16 NOTE — Progress Notes (Signed)
Patient ID: Brandy [name], female   DOB: 22-Jun-1929, 79 y.o.   MRN: [id number]        PROGRESS NOTE  DATE:  02/15/2013  FACILITY: [facility]   LEVEL OF CARE: SNF  Routine Visit  CHIEF COMPLAINT:  Manage urinary incontinence, hypothyroidism, dementia, hypertension and GERD.   HISTORY OF PRESENT ILLNESS:  REASSESSMENT OF ONGOING PROBLEM(S):  URINARY INCONTINENCE: Staff reports to me wants that urinary incontinence for a couple of days. There are no other associated signs and symptoms. There is no temporal relationship.  HYPOTHYROIDISM: The hypothyroidism remains stable. No complications noted from the medications presently being used.  The patient denies fatigue or constipation.  Last TSH 5.03 on 12/05/12, 8/14 TSH 7.02.  DEMENTIA: The dementia remains stable and continues to function adequately in the current living environment with supervision.  The patient has had little changes in behavior. No complications noted from the medications presently being used.  Patient is a poor historian.   HTN: Pt 's HTN remains stable.  Staff Deny CP, sob, DOE, pedal edema, headaches, dizziness or visual disturbances.  No complications from the medications currently being used.  Last BP : 124/64, 100/64, 130/70,  130/80, 120/70, 92/60  GERD: pt's GERD is stable.  Staff Deny ongoing heartburn, abd. Pain, nausea or vomiting.  Currently on a PPI & tolerates it without any adverse reactions.   PAST MEDICAL HISTORY : Reviewed.  No changes.  CURRENT MEDICATIONS: Reviewed per [facility]  REVIEW OF SYSTEMS:  Difficult to obtain due to dementia.    PHYSICAL EXAMINATION  VS:  T   98.8.    P 80     RR 18     BP 92/60     POX %     WT (Lb) 163  GENERAL: no acute distress, moderately obese body habitus EYES: Normal sclerae, normal conjunctivae, no discharge NECK: supple, trachea midline, no neck masses, no thyroid tenderness, no thyromegaly LYMPHATICS: No cervical lymphadenopathy, no supraclavicular  lymphadenopathy RESPIRATORY: breathing is even & unlabored, BS CTAB CARDIAC: RRR, no murmur,no extra heart sounds  EDEMA/VARICOSITIES:  +1 right lower extremity edema and +2 edema in the left lower extremity ARTERIAL:  pedal pulses diminished  GI: abdomen soft, normal BS, no masses, no tenderness, no hepatomegaly, no splenomegaly PSYCHIATRIC: the patient is alert & oriented to person, affect & behavior appropriate  LABS/RADIOLOGY:  5/14 total protein 5.7, albumin 3.3 otherwise liver profile normal, BNP 41.1, CBC normal, albumin 3.4 otherwise CMP normal, hemoglobin A1c 5.4, Depakote level 30 4/14 TSH 4.64, hemoglobin 10.8, MCV 91 otherwise CBC normal, BMP normal 07/2012:  TSH 6.65.    Hemoglobin 10.8, MCV 91, otherwise CBC normal.   BMP normal.   ASSESSMENT/PLAN:  Hypertension.  Well controlled.   Urinary incontinence-new problem. Check UA culture and sensitivities.  Dementia.  Advanced.   GERD.  Well controlled.    Hypothyroidism.  uncontrolled. Levothyroxene was increased. Recheck TSH in 6 weeks is pending.  Depression.  Continue current antidepressants.   Anemia of chronic disease.  Stable.   Edema -- stable.  Constipation-adequately controlled.  Allergic rhinitis-Allegra was started  CPT CODE: 40981

## 2013-03-14 ENCOUNTER — Non-Acute Institutional Stay (SKILLED_NURSING_FACILITY): Payer: Medicare Other | Admitting: Internal Medicine

## 2013-03-14 DIAGNOSIS — F0390 Unspecified dementia without behavioral disturbance: Secondary | ICD-10-CM

## 2013-03-14 DIAGNOSIS — I10 Essential (primary) hypertension: Secondary | ICD-10-CM

## 2013-03-14 DIAGNOSIS — E039 Hypothyroidism, unspecified: Secondary | ICD-10-CM

## 2013-03-14 DIAGNOSIS — K219 Gastro-esophageal reflux disease without esophagitis: Secondary | ICD-10-CM

## 2013-03-22 NOTE — Progress Notes (Signed)
Patient ID: Brandy [name], female   DOB: 1929/08/31, 79 y.o.   MRN: [id number]        PROGRESS NOTE  DATE:  03/14/2013  FACILITY: [facility]   LEVEL OF CARE: SNF  Routine Visit  CHIEF COMPLAINT:  Manage hypothyroidism, dementia, hypertension and GERD.   HISTORY OF PRESENT ILLNESS:  REASSESSMENT OF ONGOING PROBLEM(S):  HYPOTHYROIDISM: The hypothyroidism remains stable. No complications noted from the medications presently being used.  The patient denies fatigue or constipation.  Last TSH 5.03 on 12/05/12, 8/14 TSH 7.02, 9/14 TSH 4.19.  DEMENTIA: The dementia remains stable and continues to function adequately in the current living environment with supervision.  The patient has had little changes in behavior. No complications noted from the medications presently being used.  Patient is a poor historian.   HTN: Pt 's HTN remains stable.  Staff Deny CP, sob, DOE, headaches, dizziness or visual disturbances.  No complications from the medications currently being used.  Last BP : 124/64, 100/64, 130/70,  130/80, 120/70, 92/60, 121/86  GERD: pt's GERD is stable.  Staff Deny ongoing heartburn, abd. Pain, nausea or vomiting.  Currently on a PPI & tolerates it without any adverse reactions.   PAST MEDICAL HISTORY : Reviewed.  No changes.  CURRENT MEDICATIONS: Reviewed per [facility]  REVIEW OF SYSTEMS:  Difficult to obtain due to dementia.    PHYSICAL EXAMINATION  VS:  T   97.6   P 77     RR 18     BP 121/86     POX %     WT (Lb) 162  GENERAL: no acute distress, moderately obese body habitus NECK: supple, trachea midline, no neck masses, no thyroid tenderness, no thyromegaly RESPIRATORY: breathing is even & unlabored, BS CTAB CARDIAC: RRR, no murmur,no extra heart sounds  EDEMA/VARICOSITIES:  +1 right lower extremity edema and +2 edema in the left lower extremity ARTERIAL:  pedal pulses diminished  GI: abdomen soft, normal BS, no masses, no tenderness, no hepatomegaly, no  splenomegaly PSYCHIATRIC: the patient is alert & oriented to person, affect & behavior appropriate  LABS/RADIOLOGY:  5/14 total protein 5.7, albumin 3.3 otherwise liver profile normal, BNP 41.1, CBC normal, albumin 3.4 otherwise CMP normal, hemoglobin A1c 5.4, Depakote level 30 4/14 TSH 4.64, hemoglobin 10.8, MCV 91 otherwise CBC normal, BMP normal 07/2012:  TSH 6.65.    Hemoglobin 10.8, MCV 91, otherwise CBC normal.   BMP normal.   ASSESSMENT/PLAN:  Hypertension.  Well controlled.   Dementia.  Advanced.   GERD.  Well controlled.    Hypothyroidism.  Well controlled.   Depression.  Continue current antidepressants.   Anemia of chronic disease.  Stable.   Edema -- stable.  Constipation-adequately controlled.  Allergic rhinitis-stable.  CPT CODE: 78295

## 2013-04-01 ENCOUNTER — Encounter: Payer: Self-pay | Admitting: Family

## 2013-04-01 ENCOUNTER — Non-Acute Institutional Stay (SKILLED_NURSING_FACILITY): Payer: Medicare Other | Admitting: Family

## 2013-04-01 DIAGNOSIS — K219 Gastro-esophageal reflux disease without esophagitis: Secondary | ICD-10-CM

## 2013-04-01 DIAGNOSIS — I10 Essential (primary) hypertension: Secondary | ICD-10-CM

## 2013-04-01 DIAGNOSIS — E039 Hypothyroidism, unspecified: Secondary | ICD-10-CM

## 2013-04-01 DIAGNOSIS — R569 Unspecified convulsions: Secondary | ICD-10-CM

## 2013-04-01 DIAGNOSIS — J309 Allergic rhinitis, unspecified: Secondary | ICD-10-CM

## 2013-04-01 DIAGNOSIS — F0390 Unspecified dementia without behavioral disturbance: Secondary | ICD-10-CM

## 2013-04-01 NOTE — Progress Notes (Signed)
Patient ID: Brandy [name], female   DOB: [birth year], 79 y.o.   MRN: [id number] Date: 04/01/13  Facility: [facility]  Code Status:  DNR  Chief Complaint  Patient presents with  . Medical Managment of Chronic Issues    Routine Visit    HPI: Pt is being followed for the medical management of chronic illnesses.  Pt and health care team denies further concerns/issues at present.        Allergies  Allergen Reactions  . Other     CRANBERRY JUICE  . Sulfonamide Derivatives     REACTION: Can't remember     Medication List       This list is accurate as of: 04/01/13  3:23 PM.  Always use your most recent med list.               benztropine 1 MG tablet  Commonly known as:  COGENTIN  Take 0.5 tablets (0.5 mg total) by mouth daily.     buPROPion 75 MG tablet  Commonly known as:  WELLBUTRIN  Take 100 mg by mouth 2 (two) times daily.     CALCIUM 600+D 600-400 MG-UNIT per tablet  Generic drug:  Calcium Carbonate-Vitamin D  Take 2 tablets by mouth daily. For bones     divalproex 125 MG capsule  Commonly known as:  DEPAKOTE SPRINKLE  Take 2 capsules (250 mg total) by mouth every 12 (twelve) hours.     donepezil 10 MG tablet  Commonly known as:  ARICEPT  Take 1 tablet (10 mg total) by mouth at bedtime.     feeding supplement (ENSURE COMPLETE) Liqd  Take 237 mLs by mouth 2 (two) times daily between meals.     fexofenadine 180 MG tablet  Commonly known as:  ALLEGRA  Take 180 mg by mouth daily. As needed for allergies     memantine 5 MG tablet  Commonly known as:  NAMENDA  Take 5 mg by mouth 2 (two) times daily.     metoprolol succinate 12.5 mg Tb24 24 hr tablet  Commonly known as:  TOPROL-XL  Take 0.5 tablets (12.5 mg total) by mouth daily.     multivitamin tablet  Take 1 tablet by mouth daily.     omeprazole 40 MG capsule  Commonly known as:  PRILOSEC  Take 1 capsule (40 mg total) by mouth daily. For reflux     risperiDONE 0.5 MG tablet  Commonly known  as:  RISPERDAL  Take 0.5 mg by mouth 2 (two) times daily.     sertraline 50 MG tablet  Commonly known as:  ZOLOFT  Take 50-100 mg by mouth 2 (two) times daily. Take 2 tabs in the morning and 1 tab in the evening     topiramate 25 MG tablet  Commonly known as:  TOPAMAX  Take 1 tablet (25 mg total) by mouth daily.         DATA REVIEWED    Laboratory Studies: 02/26/13-TSH-4.190 10/27/12- WBC 8.7, RBC 4.31, Hemoglobin 11.9, Hematocrit 36.6, Platelets 198, BUN 12, Creatinine 0.68, Albumin 3.4      Past Medical History  Diagnosis Date  . Anxiety   . Depression   . Dementia     neuro eval WNL, normal MRI, eval [facility] after admission with dx mild-mod dementia  . HTN (hypertension)   . HLD (hyperlipidemia)   . Allergic rhinitis   . OSA (obstructive sleep apnea)     intermittent CPAP use  . Migraine   .  Arthritis   . Herpes zoster 12/29/2006  . Urinary incontinence     Past Surgical History  Procedure Laterality Date  . Appendectomy    . Neck surgery      CALCIUM DEPOSITS  . Abdominal hysterectomy      TAH for fibroids       History   Social History  . Marital Status: Married    Spouse Name: N/A    Number of Children: 3  . Years of Education: N/A   Occupational History  . Retired    Social History Main Topics  . Smoking status: Former Smoker -- 2.00 packs/day for 8 years    Types: Cigarettes    Quit date: 06/06/1969  . Smokeless tobacco: Never Used  . Alcohol Use: No  . Drug Use: No  . Sexual Activity: Not on file   Other Topics Concern  . Not on file   Social History Narrative   Caffeine: not much because of headaches   Retired   Married 26 years, husband 21 years younger   Patient has children - 3   Activity: goes out to walk, not much though   Working on [employer], eats a lot of [employer]     Review of Systems  Constitutional: Negative.   HENT: Negative.   Eyes:       Corrective Lenses  Respiratory: Negative.    Cardiovascular: Negative.   Gastrointestinal: Negative.   Genitourinary: Positive for urgency and frequency.       Incontinence Urine  Musculoskeletal: Positive for back pain.  Skin: Negative.   Neurological: Negative.   Endo/Heme/Allergies: Negative.   Psychiatric/Behavioral: Negative.      Physical Exam Filed Vitals:   04/01/13 1505  BP: 118/62  Pulse: 76  Temp: 98.2 F (36.8 C)  Resp: 20   There is no weight on file to calculate BMI. Physical Exam  Constitutional:  Dressed and groomed appropriately, sitting upright in wheelchair/  HENT:  Head: Normocephalic.  Mouth/Throat: Oropharynx is clear and moist.  Eyes: Conjunctivae are normal.  Neck: Normal range of motion. No thyromegaly present.  Cardiovascular: Normal rate, regular rhythm and normal heart sounds.   Pulmonary/Chest: Effort normal and breath sounds normal.  Abdominal: Soft.  Obese  Genitourinary:  Incontinent Brief in place  Musculoskeletal:  Wheelchair  Neurological:  Mouth tremors  Skin: Skin is warm, dry and intact.    ASSESSMENT/PLAN  1. Hypertension- will continue dosage of Metoprolol pt is currently stable, will continue to monitor and re-evaluate for modifications 2. Depression-will decrease Wellbutrin to 75 mg and continue same dosage of Zoloft 2. Migraine-pt is stable on Topamax 3. Dementia- will continue current dosage of Aricept 4.Hypothyroidism- will continue current dosage of Synthroid 5. Gerd-Will continue PPI, pt is currently stable 6. Depakote-will consider weaning pt off of Depakote; collaborate will psych services Obtain CBC w/diff, CMP, Lipid Panel, And Valporic Acid   Follow up:prn

## 2013-04-30 NOTE — Progress Notes (Signed)
Patient ID: Brandy [name], female   DOB: 10/18/1929, 79 y.o.   MRN: [id number]     [facility]  Allergies  Allergen Reactions  . Other     CRANBERRY JUICE  . Sulfonamide Derivatives     REACTION: Can't remember    Chief Complaint  Patient presents with  . Acute Visit    follow up tsh     HPI  She is being seen for her elevated tsh. Her level is 6.650. She is presently taking not taking medications for hypothyroidism. She cannot fully participate in the hpi or ros. There are no concerns being voiced by the nursing staff at this time.   Past Medical History  Diagnosis Date  . Anxiety   . Depression   . Dementia     neuro eval WNL, normal MRI, eval at behavioral health center after admission with dx mild-mod dementia  . HTN (hypertension)   . HLD (hyperlipidemia)   . Allergic rhinitis   . OSA (obstructive sleep apnea)     intermittent CPAP use  . Migraine   . Arthritis   . Herpes zoster 12/29/2006  . Urinary incontinence     Past Surgical History  Procedure Laterality Date  . Appendectomy    . Neck surgery      CALCIUM DEPOSITS  . Abdominal hysterectomy      TAH for fibroids    Current Outpatient Prescriptions on File Prior to Visit  Medication Sig Dispense Refill  . benztropine (COGENTIN) 1 MG tablet Take 0.5 tablets (0.5 mg total) by mouth daily.      . Calcium Carbonate-Vitamin D (CALCIUM 600+D) 600-400 MG-UNIT per tablet Take 2 tablets by mouth daily. For bones      . divalproex (DEPAKOTE SPRINKLE) 125 MG capsule Take 2 capsules (250 mg total) by mouth every 12 (twelve) hours.      Marland Kitchen donepezil (ARICEPT) 10 MG tablet Take 1 tablet (10 mg total) by mouth at bedtime.  30 tablet  1  . feeding supplement (ENSURE COMPLETE) LIQD Take 237 mLs by mouth 2 (two) times daily between meals.      . fexofenadine (ALLEGRA) 180 MG tablet Take 180 mg by mouth daily. As needed for allergies      . memantine (NAMENDA) 5 MG tablet Take 5 mg by mouth 2 (two) times daily.      .  metoprolol succinate (TOPROL-XL) 12.5 mg TB24 Take 0.5 tablets (12.5 mg total) by mouth daily.      . Multiple Vitamin (MULTIVITAMIN) tablet Take 1 tablet by mouth daily.        Marland Kitchen omeprazole (PRILOSEC) 40 MG capsule Take 1 capsule (40 mg total) by mouth daily. For reflux  30 capsule  11  . risperiDONE (RISPERDAL) 0.5 MG tablet Take 0.5 mg by mouth 2 (two) times daily.      . sertraline (ZOLOFT) 50 MG tablet Take 50-100 mg by mouth 2 (two) times daily. Take 2 tabs in the morning and 1 tab in the evening      . topiramate (TOPAMAX) 25 MG tablet Take 1 tablet (25 mg total) by mouth daily.  30 tablet  11   No current facility-administered medications on file prior to visit.    Filed Vitals:   09/13/12 1314  BP: 110/78  Pulse: 72  Height: 5\' 4"  (1.626 m)  Weight: 160 lb (72.576 kg)    LABS REVIEWED:  09-05-12: wbc 8.7; hgb 10.8; hct 33.1 ;mcv 91; plt 261; glucose 86; bun  15; creat 0.42; k+4.3; na++140 09-12-12: tsh 6.650  Review of Systems  Unable to perform ROS   Physical Exam  Constitutional: She appears well-developed and well-nourished.  Neck: Normal range of motion. Neck supple. No JVD present. No thyromegaly present.  Cardiovascular: Normal rate, regular rhythm and intact distal pulses.   Respiratory: Effort normal and breath sounds normal. No respiratory distress. She has no wheezes.  GI: Soft. Bowel sounds are normal. She exhibits no distension. There is no tenderness.  Musculoskeletal: Normal range of motion. She exhibits no edema.  Neurological: She is alert.  Skin: Skin is warm and dry.     ASSESSMENT/ PLAN:  1. Hypothyroidism: will begin synthroid 50 mcg daily wiill check tsh in 6 weeks and will monitor her status

## 2013-05-07 ENCOUNTER — Non-Acute Institutional Stay (SKILLED_NURSING_FACILITY): Payer: Medicare Other | Admitting: Internal Medicine

## 2013-05-07 DIAGNOSIS — E039 Hypothyroidism, unspecified: Secondary | ICD-10-CM

## 2013-05-07 DIAGNOSIS — I10 Essential (primary) hypertension: Secondary | ICD-10-CM

## 2013-05-07 DIAGNOSIS — K219 Gastro-esophageal reflux disease without esophagitis: Secondary | ICD-10-CM

## 2013-05-07 DIAGNOSIS — F0390 Unspecified dementia without behavioral disturbance: Secondary | ICD-10-CM

## 2013-05-10 ENCOUNTER — Encounter: Payer: Self-pay | Admitting: Internal Medicine

## 2013-05-10 NOTE — Progress Notes (Signed)
Patient ID: Brandy [name], female   DOB: Jun 16, 1929, 79 y.o.   MRN: [id number]        PROGRESS NOTE  DATE:  05/07/2013  FACILITY: [facility]   LEVEL OF CARE: SNF  Routine Visit  CHIEF COMPLAINT:  Manage hypothyroidism, dementia, hypertension and GERD.   HISTORY OF PRESENT ILLNESS:  REASSESSMENT OF ONGOING PROBLEM(S):  HYPOTHYROIDISM: The hypothyroidism remains stable. No complications noted from the medications presently being used.  The patient denies fatigue or constipation.  Last TSH 5.03 on 12/05/12, 8/14 TSH 7.02, 9/14 TSH 4.19.  DEMENTIA: The dementia remains stable and continues to function adequately in the current living environment with supervision.  The patient has had little changes in behavior. No complications noted from the medications presently being used.  Patient is a poor historian.   HTN: Pt 's HTN remains stable.  Staff Deny CP, sob, DOE, headaches, dizziness or visual disturbances.  No complications from the medications currently being used.  Last BP : 124/64, 100/64, 130/70,  130/80, 120/70, 92/60, 121/86, 114/62  GERD: pt's GERD is stable.  Staff Deny ongoing heartburn, abd. Pain, nausea or vomiting.  Currently on a PPI & tolerates it without any adverse reactions.   PAST MEDICAL HISTORY : Reviewed.  No changes.  CURRENT MEDICATIONS: Reviewed per [facility]  REVIEW OF SYSTEMS:  Difficult to obtain due to dementia.    PHYSICAL EXAMINATION  VS:  T   98.6   P 79     RR 20     BP 114/62     POX %     WT (Lb) 164  GENERAL: no acute distress, moderately obese body habitus NECK: supple, trachea midline, no neck masses, no thyroid tenderness, no thyromegaly RESPIRATORY: breathing is even & unlabored, BS CTAB CARDIAC: RRR, no murmur,no extra heart sounds  EDEMA/VARICOSITIES:  +1 right lower extremity edema and +2 edema in the left lower extremity ARTERIAL:  pedal pulses diminished  GI: abdomen soft, normal BS, no masses, no tenderness, no hepatomegaly, no  splenomegaly PSYCHIATRIC: the patient is alert & oriented to person, affect & behavior appropriate  LABS/RADIOLOGY:  10-14 CBC normal, total protein 5.7 otherwise CMP normal  5/14 total protein 5.7, albumin 3.3 otherwise liver profile normal, BNP 41.1, CBC normal, albumin 3.4 otherwise CMP normal, hemoglobin A1c 5.4, Depakote level 30 4/14 TSH 4.64, hemoglobin 10.8, MCV 91 otherwise CBC normal, BMP normal 07/2012:  TSH 6.65.    Hemoglobin 10.8, MCV 91, otherwise CBC normal.   BMP normal.   ASSESSMENT/PLAN:  Hypertension.  Well controlled.   Dementia.  Advanced.   GERD.  Well controlled.    Hypothyroidism.  Well controlled.   Depression.  Continue current antidepressants. Wellbutrin was decreased  Anemia of chronic disease.  Stable.   Edema -- stable.  Constipation-adequately controlled.  Allergic rhinitis-stable.  CPT CODE: 40981

## 2013-05-15 NOTE — Progress Notes (Signed)
Patient ID: Brandy [name], female   DOB: 02/05/1930, 79 y.o.   MRN: [id number]     [facility]  Allergies  Allergen Reactions  . Other     CRANBERRY JUICE  . Sulfonamide Derivatives     REACTION: Can't remember    Chief Complaint  Patient presents with  . Acute Visit    follow up fever and wheezes     HPI  She is currently being treated with cipro for an uti. She has wheezes present; she was started on ceftin last night. This abt can be stopped; and she can have symptoms treated and complete her abt and continue to monitor her status; with further intervention if indicated.   Past Medical History  Diagnosis Date  . Anxiety   . Depression   . Dementia     neuro eval WNL, normal MRI, eval at behavioral health center after admission with dx mild-mod dementia  . HTN (hypertension)   . HLD (hyperlipidemia)   . Allergic rhinitis   . OSA (obstructive sleep apnea)     intermittent CPAP use  . Migraine   . Arthritis   . Herpes zoster 12/29/2006  . Urinary incontinence     Past Surgical History  Procedure Laterality Date  . Appendectomy    . Neck surgery      CALCIUM DEPOSITS  . Abdominal hysterectomy      TAH for fibroids    Filed Vitals:   12/31/12 1516  BP: 140/80  Pulse: 100  Temp: 100.5 F (38.1 C)  Height: 5\' 4"  (1.626 m)  Weight: 168 lb (76.204 kg)    MEDICATIONS cipro 250 mg twice daily for 5 days from 12-27-12 Ca+= with d daily mvi daily topamax 25 mg daily Lopressor 12.5 mg daily namenda xr 14 mg daily Synthroid 25 mcg daily wellbutrin 100 mg twice daily depakote sprinkles 375 mg twice daily prilosec 40 mg daily aricept 10 mg daily senna s daily Allegra 180 mg daily prn      LABS REVIEWED:  12-06-12: tsh 5.030 12-24-12: urine culture: p mirabilis: cipro  Review of Systems  Unable to perform ROS      Physical Exam  Constitutional: She appears well-developed and well-nourished.  Neck: Normal range of motion. Neck supple. No JVD  present. No thyromegaly present.  Cardiovascular: Normal rate, regular rhythm and intact distal pulses.   Respiratory: Effort normal and breath HAS FINE EXPIRATORY WHEEZES PRESENT   GI: Soft. Bowel sounds are normal. She exhibits no distension. There is no tenderness.  Musculoskeletal: Normal range of motion. She exhibits no edema.  Neurological: She is alert.  Skin: Skin is warm and dry.     ASSESSMENT/ PLAN:  1. Uti: will complete cipro and will monitor her status  2. URI: will begin her on mucinex twice daily for one week and change her allegra to daily for one week then return to prn status; will treat with further abt as indicated.

## 2013-06-11 ENCOUNTER — Non-Acute Institutional Stay (SKILLED_NURSING_FACILITY): Payer: Medicare Other | Admitting: Internal Medicine

## 2013-06-11 DIAGNOSIS — K219 Gastro-esophageal reflux disease without esophagitis: Secondary | ICD-10-CM

## 2013-06-11 DIAGNOSIS — F0390 Unspecified dementia without behavioral disturbance: Secondary | ICD-10-CM

## 2013-06-11 DIAGNOSIS — I10 Essential (primary) hypertension: Secondary | ICD-10-CM

## 2013-06-11 DIAGNOSIS — E039 Hypothyroidism, unspecified: Secondary | ICD-10-CM

## 2013-06-11 NOTE — Progress Notes (Signed)
Patient ID: Donavan FoilDarlene J Helmer, female   DOB: 08/16/1929, 80 y.o.   MRN: [id number]        PROGRESS NOTE  DATE: 06-11-13  FACILITY: [facility]   LEVEL OF CARE: SNF  Routine Visit  CHIEF COMPLAINT:  Manage hypothyroidism, dementia, hypertension and GERD.   HISTORY OF PRESENT ILLNESS:  REASSESSMENT OF ONGOING PROBLEM(S):  HYPOTHYROIDISM: The hypothyroidism remains stable. No complications noted from the medications presently being used.  The patient denies fatigue or constipation.  Last TSH 5.03 on 12/05/12, 8/14 TSH 7.02, 9/14 TSH 4.19.  DEMENTIA: The dementia remains stable and continues to function adequately in the current living environment with supervision.  The patient has had little changes in behavior. No complications noted from the medications presently being used.  Patient is a poor historian.   HTN: Pt 's HTN remains stable.  Staff Deny CP, sob, DOE, headaches, dizziness or visual disturbances.  No complications from the medications currently being used.  Last BP : 124/64, 100/64, 130/70,  130/80, 120/70, 92/60, 121/86, 114/62, 136/66  GERD: pt's GERD is stable.  Staff Deny ongoing heartburn, abd. Pain, nausea or vomiting.  Currently on a PPI & tolerates it without any adverse reactions.   PAST MEDICAL HISTORY : Reviewed.  No changes.  [facility]  REVIEW OF SYSTEMS:  Difficult to obtain due to dementia.    PHYSICAL EXAMINATION  VS:  T   98.6   P 66     RR 17     BP 136/66     POX %     WT (Lb) 163  GENERAL: no acute distress, moderately obese body habitus NECK: supple, trachea midline, no neck masses, no thyroid tenderness, no thyromegaly RESPIRATORY: breathing is even & unlabored, BS CTAB CARDIAC: RRR, no murmur,no extra heart sounds  EDEMA/VARICOSITIES:  +1 right lower extremity edema and +2 edema in the left lower extremity ARTERIAL:  pedal pulses diminished  GI: abdomen soft, normal BS, no masses, no tenderness, no hepatomegaly, no  splenomegaly PSYCHIATRIC: the patient is alert & oriented to person, affect & behavior appropriate  LABS/RADIOLOGY:  10-14 CBC normal, total protein 5.7 otherwise CMP normal  5/14 total protein 5.7, albumin 3.3 otherwise liver profile normal, BNP 41.1, CBC normal, albumin 3.4 otherwise CMP normal, hemoglobin A1c 5.4, Depakote level 30 4/14 TSH 4.64, hemoglobin 10.8, MCV 91 otherwise CBC normal, BMP normal 07/2012:  TSH 6.65.    Hemoglobin 10.8, MCV 91, otherwise CBC normal.   BMP normal.   ASSESSMENT/PLAN:  Hypertension.  Well controlled.   Dementia.  Advanced.   GERD.  Well controlled.    Hypothyroidism.  Well controlled.   Depression.  Wellbutrin was discontinued  Anemia of chronic disease.  Stable.   Edema -- stable.  Constipation-adequately controlled.  Allergic rhinitis-stable.  CPT CODE: 6578499308

## 2013-07-09 ENCOUNTER — Non-Acute Institutional Stay (SKILLED_NURSING_FACILITY): Payer: Medicare Other | Admitting: Internal Medicine

## 2013-07-09 DIAGNOSIS — J189 Pneumonia, unspecified organism: Secondary | ICD-10-CM

## 2013-07-13 DIAGNOSIS — J189 Pneumonia, unspecified organism: Secondary | ICD-10-CM | POA: Insufficient documentation

## 2013-07-13 NOTE — Progress Notes (Signed)
         PROGRESS NOTE  DATE: 07/09/2013  [facility]  LEVEL OF CARE: SNF (31)  Acute Visit  CHIEF COMPLAINT:  Manage pneumonia  HISTORY OF PRESENT ILLNESS: I was requested by the staff to assess the patient regarding above problem(s):  Staff reports that patient has chest congestion, coughing and wheezing. Symptoms have been present for several days. Staff did not report respiratory insufficiency. Chest x-ray on 07-08-13 shows patchy air space densities in the right infrahilar region compatible with pneumonia. Patient is a poor historian.  PAST MEDICAL HISTORY : Reviewed.  No changes.  [facility]  REVIEW OF SYSTEMS: Unobtainable due to dementia  PHYSICAL EXAMINATION  VS:  T 97.9        P 80      RR 18      BP 104/54      POX % 91       GENERAL: no acute distress, moderate obese body habitus EYES: conjunctivae normal, sclerae normal, normal eye lids NECK: supple, trachea midline, no neck masses, no thyroid tenderness, no thyromegaly LYMPHATICS: no LAN in the neck, no supraclavicular LAN RESPIRATORY: breathing is even & unlabored, BS decreased CARDIAC: RRR, no murmur,no extra heart sounds, no edema GI: abdomen soft, normal BS, no masses, no tenderness, no hepatomegaly, no splenomegaly PSYCHIATRIC: the patient is alert & disoriented, affect & behavior appropriate  LABS/RADIOLOGY: See history of present illness  ASSESSMENT/PLAN:  Right-sided pneumonia-new onset significant problem. Start Levaquin 500 mg daily for 7 days and probiotics twice a day for 10 days  CPT CODE: [id number]

## 2013-07-23 ENCOUNTER — Non-Acute Institutional Stay (SKILLED_NURSING_FACILITY): Payer: Medicare Other | Admitting: Internal Medicine

## 2013-07-23 DIAGNOSIS — R0989 Other specified symptoms and signs involving the circulatory and respiratory systems: Secondary | ICD-10-CM

## 2013-07-30 NOTE — Progress Notes (Signed)
Patient ID: Brandy FoilDarlene J [name], female   DOB: 02/12/1930, 80 y.o.   MRN: [id number]          PROGRESS NOTE  DATE: 07/23/2013    [facility]  LEVEL OF CARE: SNF (31)  Acute Visit  CHIEF COMPLAINT:  Manage congestion.    HISTORY OF PRESENT ILLNESS: I was requested by the staff to assess the patient regarding above problem(s):  Staff report that patient is congested.  Patient overall is a poor historian.  Staff do not report any cough or respiratory insufficiency.  She completed a 10-day course of Levaquin recently, as well.      PAST MEDICAL HISTORY : Reviewed.  No changes.  [facility]  REVIEW OF SYSTEMS:  Unobtainable due to dementia.    PHYSICAL EXAMINATION  VS:  T 98.9       P 76      RR 20      BP 150/69         GENERAL: no acute distress, normal body habitus NECK: supple, trachea midline, no neck masses, no thyroid tenderness, no thyromegaly LYMPHATICS: no LAN in the neck, no supraclavicular LAN RESPIRATORY: decreased breath sounds   CARDIAC: RRR, no murmur,no extra heart sounds, no edema  ASSESSMENT/PLAN:  Lung congestion.  I do not feel that patient has an active infection.  We will obtain a chest x-ray and start Mucinex 600 mg b.i.d. for one week.     CPT CODE: [id number]     Angela CoxGayani Y Dasanayaka, MD [facility] [phone]

## 2013-10-14 ENCOUNTER — Non-Acute Institutional Stay (SKILLED_NURSING_FACILITY): Payer: Medicare Other | Admitting: Internal Medicine

## 2013-10-14 DIAGNOSIS — I10 Essential (primary) hypertension: Secondary | ICD-10-CM

## 2013-10-14 DIAGNOSIS — K219 Gastro-esophageal reflux disease without esophagitis: Secondary | ICD-10-CM

## 2013-10-14 DIAGNOSIS — F0390 Unspecified dementia without behavioral disturbance: Secondary | ICD-10-CM

## 2013-10-14 DIAGNOSIS — E039 Hypothyroidism, unspecified: Secondary | ICD-10-CM

## 2013-10-14 NOTE — Progress Notes (Signed)
Patient ID: Brandy [name], female   DOB: 08/15/1929, 80 y.o.   MRN: [id number]         PROGRESS NOTE  DATE: 10-14-13  FACILITY: [facility]   LEVEL OF CARE: SNF  Routine Visit  CHIEF COMPLAINT:  Manage hypothyroidism, dementia, hypertension and GERD.   HISTORY OF PRESENT ILLNESS:  REASSESSMENT OF ONGOING PROBLEM(S):  HYPOTHYROIDISM: The hypothyroidism remains stable. No complications noted from the medications presently being used.  The patient denies fatigue or constipation.  Last TSH 5.03 on 12/05/12, 8/14 TSH 7.02, 9/14 TSH 4.19.  DEMENTIA: The dementia remains stable and continues to function adequately in the current living environment with supervision.  The patient has had little changes in behavior. No complications noted from the medications presently being used.  Patient is a poor historian.   HTN: Pt 's HTN remains stable.  Staff Deny CP, sob, DOE, headaches, dizziness or visual disturbances.  No complications from the medications currently being used.  Last BP : 124/64, 100/64, 130/70,  130/80, 120/70, 92/60, 121/86, 114/62, 136/66, 148/70  GERD: pt's GERD is stable.  Staff Deny ongoing heartburn, abd. Pain, nausea or vomiting.  Currently on a PPI & tolerates it without any adverse reactions.   PAST MEDICAL HISTORY : Reviewed.  No changes.  [facility]  REVIEW OF SYSTEMS:  Difficult to obtain due to dementia.    PHYSICAL EXAMINATION  VS: See vital signs section  GENERAL: no acute distress, moderately obese body habitus EYES: Normal sclerae, normal conjunctivae, no discharge NECK: supple, trachea midline, no neck masses, no thyroid tenderness, no thyromegaly LYMPHATICS: No cervical lymphadenopathy, no supraclavicular lymphadenopathy RESPIRATORY: breathing is even & unlabored, BS CTAB CARDIAC: RRR, no murmur,no extra heart sounds  EDEMA/VARICOSITIES:  +1 right lower extremity edema and +2 edema in the left lower extremity ARTERIAL:  pedal  pulses diminished  GI: abdomen soft, normal BS, no masses, no tenderness, no hepatomegaly, no splenomegaly PSYCHIATRIC: the patient is alert & oriented to person, affect & behavior appropriate  LABS/RADIOLOGY: 1-15 CBC normal 10-14 CBC normal, total protein 5.7 otherwise CMP normal  5/14 total protein 5.7, albumin 3.3 otherwise liver profile normal, BNP 41.1, CBC normal, albumin 3.4 otherwise CMP normal, hemoglobin A1c 5.4, Depakote level 30 4/14 TSH 4.64, hemoglobin 10.8, MCV 91 otherwise CBC normal, BMP normal 07/2012:  TSH 6.65.    Hemoglobin 10.8, MCV 91, otherwise CBC normal.   BMP normal.   ASSESSMENT/PLAN:  Hypertension.  Well controlled.   Dementia.  Advanced.   GERD.  Well controlled.    Hypothyroidism.  Well controlled.   Depression.  Zoloft  Anemia of chronic disease.  Stable.   Edema -- stable.  Constipation-adequately controlled.  Allergic rhinitis-stable.  Check CMP and Depakote level  CPT CODE: [id number]  Newton PiggGayani Y. Kerry Doryasanayaka, MD [facility] [phone]

## 2013-11-04 ENCOUNTER — Non-Acute Institutional Stay (SKILLED_NURSING_FACILITY): Payer: Medicare Other | Admitting: Internal Medicine

## 2013-11-04 DIAGNOSIS — I10 Essential (primary) hypertension: Secondary | ICD-10-CM

## 2013-11-04 DIAGNOSIS — E039 Hypothyroidism, unspecified: Secondary | ICD-10-CM

## 2013-11-04 DIAGNOSIS — K219 Gastro-esophageal reflux disease without esophagitis: Secondary | ICD-10-CM

## 2013-11-04 DIAGNOSIS — F0390 Unspecified dementia without behavioral disturbance: Secondary | ICD-10-CM

## 2013-11-04 NOTE — Progress Notes (Signed)
Patient ID: Brandy [name], female   DOB: 1929/10/26, 80 y.o.   MRN: [id number]          PROGRESS NOTE  DATE: 11-04-13  FACILITY: [facility]   LEVEL OF CARE: SNF  Routine Visit  CHIEF COMPLAINT:  Manage hypothyroidism, dementia, hypertension and GERD.   HISTORY OF PRESENT ILLNESS:  REASSESSMENT OF ONGOING PROBLEM(S):  HYPOTHYROIDISM: The hypothyroidism remains stable. No complications noted from the medications presently being used.  The patient denies fatigue or constipation.  Last TSH 5.03 on 12/05/12, 8/14 TSH 7.02, 9/14 TSH 4.19.  DEMENTIA: The dementia remains stable and continues to function adequately in the current living environment with supervision.  The patient has had little changes in behavior. No complications noted from the medications presently being used.  Patient is a poor historian.   HTN: Pt 's HTN remains stable.  Staff Deny CP, sob, DOE, headaches, dizziness or visual disturbances.  No complications from the medications currently being used.  Last BP : 124/64, 100/64, 130/70,  130/80, 120/70, 92/60, 121/86, 114/62, 136/66, 148/70, 148/68  GERD: pt's GERD is stable.  Staff Deny ongoing heartburn, abd. Pain, nausea or vomiting.  Currently on a PPI & tolerates it without any adverse reactions.   PAST MEDICAL HISTORY : Reviewed.  No changes.  CURRENT MEDICATIONS: Reviewed per [facility]  REVIEW OF SYSTEMS:  Difficult to obtain due to dementia.    PHYSICAL EXAMINATION  VS: See vital signs section  GENERAL: no acute distress, moderately obese body habitus NECK: supple, trachea midline, no neck masses, no thyroid tenderness, no thyromegaly  RESPIRATORY: breathing is even & unlabored, BS CTAB CARDIAC: RRR, no murmur,no extra heart sounds  EDEMA/VARICOSITIES:  +1 right lower extremity edema and +2 edema in the left lower extremity ARTERIAL:  pedal pulses diminished  GI: abdomen soft, normal BS, no masses, no tenderness, no hepatomegaly, no splenomegaly PSYCHIATRIC: the  patient is alert & disoriented, affect & behavior appropriate  LABS/RADIOLOGY: 1-15 CBC normal 10-14 CBC normal, total protein 5.7 otherwise CMP normal  5/14 total protein 5.7, albumin 3.3 otherwise liver profile normal, BNP 41.1, CBC normal, albumin 3.4 otherwise CMP normal, hemoglobin A1c 5.4, Depakote level 30 4/14 TSH 4.64, hemoglobin 10.8, MCV 91 otherwise CBC normal, BMP normal 07/2012:  TSH 6.65.    Hemoglobin 10.8, MCV 91, otherwise CBC normal.   BMP normal.   ASSESSMENT/PLAN:  Hypertension.  Blood pressure borderline. Will monitor.  Dementia.  Advanced.   GERD.  Well controlled.    Hypothyroidism.  Well controlled.   Depression.  Zoloft  Anemia of chronic disease.  Stable.   Edema -- stable.  Constipation-adequately controlled.  Allergic rhinitis-stable.  Check CMP and Depakote level  CPT CODE: 30051  Newton Pigg. Kerry Dory, MD [facility] [phone]

## 2013-12-09 ENCOUNTER — Non-Acute Institutional Stay (SKILLED_NURSING_FACILITY): Payer: Medicare Other | Admitting: Internal Medicine

## 2013-12-09 DIAGNOSIS — K219 Gastro-esophageal reflux disease without esophagitis: Secondary | ICD-10-CM

## 2013-12-09 DIAGNOSIS — I10 Essential (primary) hypertension: Secondary | ICD-10-CM

## 2013-12-09 DIAGNOSIS — F0390 Unspecified dementia without behavioral disturbance: Secondary | ICD-10-CM

## 2013-12-09 DIAGNOSIS — E039 Hypothyroidism, unspecified: Secondary | ICD-10-CM

## 2013-12-11 NOTE — Progress Notes (Signed)
Patient ID: Brandy FoilDarlene J Nicholls, female   DOB: 10/19/1929, 80 y.o.   MRN: [id number]         PROGRESS NOTE  DATE: 12-09-13  FACILITY: [facility]   LEVEL OF CARE: SNF  Routine Visit  CHIEF COMPLAINT:  Manage hypothyroidism, dementia, hypertension and GERD.   HISTORY OF PRESENT ILLNESS:  REASSESSMENT OF ONGOING PROBLEM(S):  HYPOTHYROIDISM: The hypothyroidism remains stable. No complications noted from the medications presently being used.  The patient denies fatigue or constipation.  Last TSH 5.03 on 12/05/12, 8/14 TSH 7.02, 9/14 TSH 4.19.  DEMENTIA: The dementia remains stable and continues to function adequately in the current living environment with supervision.  The patient has had little changes in behavior. No complications noted from the medications presently being used.  Patient is a poor historian.   HTN: Pt 's HTN remains stable.  Staff Deny CP, sob, DOE, headaches, dizziness or visual disturbances.  No complications from the medications currently being used.  Last BP : 124/64, 100/64, 130/70,  130/80, 120/70, 92/60, 121/86, 114/62, 136/66, 148/70, 148/68, 118/64  GERD: pt's GERD is stable.  Staff Deny ongoing heartburn, abd. Pain, nausea or vomiting.  Currently on a PPI & tolerates it without any adverse reactions.   PAST MEDICAL HISTORY : Reviewed.  No changes.  [facility]  REVIEW OF SYSTEMS:  Difficult to obtain due to dementia.    PHYSICAL EXAMINATION  VS: See vital signs section  GENERAL: no acute distress, moderately obese body habitus NECK: supple, trachea midline, no neck masses, no thyroid tenderness, no thyromegaly RESPIRATORY: breathing is even & unlabored, BS CTAB CARDIAC: RRR, no murmur,no extra heart sounds  EDEMA/VARICOSITIES:  +1 right lower extremity edema and +2 edema in the left lower extremity ARTERIAL:  pedal pulses diminished  GI: abdomen soft, normal BS, no masses, no tenderness, no hepatomegaly, no splenomegaly PSYCHIATRIC:  the patient is alert & disoriented, affect & behavior appropriate  LABS/RADIOLOGY: 1-15 CBC normal 10-14 CBC normal, total protein 5.7 otherwise CMP normal  5/14 total protein 5.7, albumin 3.3 otherwise liver profile normal, BNP 41.1, CBC normal, albumin 3.4 otherwise CMP normal, hemoglobin A1c 5.4, Depakote level 30 4/14 TSH 4.64, hemoglobin 10.8, MCV 91 otherwise CBC normal, BMP normal 07/2012:  TSH 6.65.    Hemoglobin 10.8, MCV 91, otherwise CBC normal.   BMP normal.   ASSESSMENT/PLAN:  Hypertension. Well controlled.  Dementia.  Advanced.   GERD.  Well controlled.    Hypothyroidism.  Well controlled.   Depression.  On Zoloft  Anemia of chronic disease.  Stable. Check CBC  Edema -- stable.  Constipation-adequately controlled.  Allergic rhinitis-stable.  Check CBC, CMP and Depakote level  CPT CODE: [id number]  Newton PiggGayani Y. Kerry Doryasanayaka, MD [facility] [phone]

## 2014-01-09 ENCOUNTER — Non-Acute Institutional Stay (SKILLED_NURSING_FACILITY): Payer: Medicare Other | Admitting: Internal Medicine

## 2014-01-09 DIAGNOSIS — K219 Gastro-esophageal reflux disease without esophagitis: Secondary | ICD-10-CM

## 2014-01-09 DIAGNOSIS — I10 Essential (primary) hypertension: Secondary | ICD-10-CM

## 2014-01-09 DIAGNOSIS — F0390 Unspecified dementia without behavioral disturbance: Secondary | ICD-10-CM

## 2014-01-09 DIAGNOSIS — E039 Hypothyroidism, unspecified: Secondary | ICD-10-CM

## 2014-01-11 NOTE — Progress Notes (Signed)
Patient ID: Brandy [name], female   DOB: 07/16/1929, 80 y.o.   MRN: [id number]         PROGRESS NOTE  DATE: 01-09-14  FACILITY: [facility]   LEVEL OF CARE: SNF  Routine Visit  CHIEF COMPLAINT:  Manage hypothyroidism, dementia, hypertension and GERD.   HISTORY OF PRESENT ILLNESS:  REASSESSMENT OF ONGOING PROBLEM(S):  HYPOTHYROIDISM: The hypothyroidism remains stable. No complications noted from the medications presently being used.  The patient denies fatigue or constipation.  Last TSH 5.03 on 12/05/12, 8/14 TSH 7.02, 9/14 TSH 4.19, in 5-15 TSH 4.3  DEMENTIA: The dementia remains stable and continues to function adequately in the current living environment with supervision.  The patient has had little changes in behavior. No complications noted from the medications presently being used.  Patient is a poor historian.   HTN: Pt 's HTN remains stable.  Staff Deny CP, sob, DOE, headaches, dizziness or visual disturbances.  No complications from the medications currently being used.  Last BP : 124/64, 100/64, 130/70,  130/80, 120/70, 92/60, 121/86, 114/62, 136/66, 148/70, 148/68, 118/64, 131/81  GERD: pt's GERD is stable.  Staff Deny ongoing heartburn, abd. Pain, nausea or vomiting.  Currently on a PPI & tolerates it without any adverse reactions.   PAST MEDICAL HISTORY : Reviewed.  No changes.  [facility]  REVIEW OF SYSTEMS:  Difficult to obtain due to dementia.    PHYSICAL EXAMINATION  VS: See vital signs section  GENERAL: no acute distress, moderately obese body habitus NECK: supple, trachea midline, no neck masses, no thyroid tenderness, no thyromegaly RESPIRATORY: breathing is even & unlabored, BS CTAB CARDIAC: RRR, no murmur,no extra heart sounds  EDEMA/VARICOSITIES:  +1 right lower extremity edema and +2 edema in the left lower extremity ARTERIAL:  pedal pulses diminished  GI: abdomen soft, normal BS, no masses, no tenderness, no hepatomegaly, no  splenomegaly PSYCHIATRIC: the patient is alert & disoriented, affect & behavior appropriate  LABS/RADIOLOGY: 7-15 CBC normal 5-15 total protein 5.3, albumin 3.2 otherwise CMP normal, and Depakote level 52 1-15 CBC normal 10-14 CBC normal, total protein 5.7 otherwise CMP normal  5/14 total protein 5.7, albumin 3.3 otherwise liver profile normal, BNP 41.1, CBC normal, albumin 3.4 otherwise CMP normal, hemoglobin A1c 5.4, Depakote level 30 4/14 TSH 4.64, hemoglobin 10.8, MCV 91 otherwise CBC normal, BMP normal 07/2012:  TSH 6.65.    Hemoglobin 10.8, MCV 91, otherwise CBC normal.   BMP normal.   ASSESSMENT/PLAN:  Hypertension. Well controlled.  Dementia.  Advanced.   GERD.  Well controlled.    Hypothyroidism.  Well controlled.   Depression.  On Zoloft  Edema -- stable.  Constipation-adequately controlled.  Allergic rhinitis-stable.  CPT CODE: [id number]  Newton PiggGayani Y. Kerry Doryasanayaka, MD [facility] [phone]

## 2014-01-13 ENCOUNTER — Non-Acute Institutional Stay (SKILLED_NURSING_FACILITY): Payer: Medicare Other | Admitting: Internal Medicine

## 2014-01-13 DIAGNOSIS — R05 Cough: Secondary | ICD-10-CM

## 2014-01-13 DIAGNOSIS — R059 Cough, unspecified: Secondary | ICD-10-CM

## 2014-01-15 NOTE — Progress Notes (Signed)
Patient ID: Brandy [name], female   DOB: 06/19/1929, 80 y.o.   MRN: [id number]           PROGRESS NOTE  DATE: 01/13/2014          [facility]  LEVEL OF CARE: SNF (31)  Acute Visit  CHIEF COMPLAINT:  Manage cough.    HISTORY OF PRESENT ILLNESS: I was requested by the staff to assess the patient regarding above problem(s):  Staff report that patient is having a nonproductive cough for a couple of days.  Patient is a poor historian.  Staff do not report respiratory insufficiency or fever.  He cannot identify precipitating or alleviating factors.  There is no temporal relationship.    PAST MEDICAL HISTORY : Reviewed.  No changes/see problem list  CURRENT MEDICATIONS: Reviewed per [facility]/see medication list  PHYSICAL EXAMINATION  VS: see VS section  GENERAL: no acute distress, normal body habitus NECK: supple, trachea midline, no neck masses, no thyroid tenderness, no thyromegaly LYMPHATICS: no LAN in the neck, no supraclavicular LAN RESPIRATORY: breathing is even & unlabored, BS CTAB  ASSESSMENT/PLAN:  Cough.  New problem.  Obtain chest x-ray.  Start Robitussin 10 mL q.6 for five days.    CPT CODE: [id number]           Brandy CoxGayani Y Darlys Buis, MD [facility] [phone]

## 2014-02-19 ENCOUNTER — Non-Acute Institutional Stay (SKILLED_NURSING_FACILITY): Payer: Medicare Other | Admitting: Internal Medicine

## 2014-02-19 DIAGNOSIS — E039 Hypothyroidism, unspecified: Secondary | ICD-10-CM

## 2014-02-19 DIAGNOSIS — F0390 Unspecified dementia without behavioral disturbance: Secondary | ICD-10-CM

## 2014-02-19 DIAGNOSIS — K219 Gastro-esophageal reflux disease without esophagitis: Secondary | ICD-10-CM

## 2014-02-19 DIAGNOSIS — I10 Essential (primary) hypertension: Secondary | ICD-10-CM

## 2014-02-20 NOTE — Progress Notes (Signed)
Patient ID: Brandy [name], female   DOB: 08-Sep-1929, 80 y.o.   MRN: [id number]         PROGRESS NOTE  DATE: 02-19-14  FACILITY: [facility]   LEVEL OF CARE: SNF  Routine Visit  CHIEF COMPLAINT:  Manage hypothyroidism, dementia, hypertension and GERD.   HISTORY OF PRESENT ILLNESS:  REASSESSMENT OF ONGOING PROBLEM(S):  HYPOTHYROIDISM: The hypothyroidism remains stable. No complications noted from the medications presently being used.  The staff deny fatigue or constipation.  Last TSH 5.03 on 12/05/12, 8/14 TSH 7.02, 9/14 TSH 4.19, in 5-15 TSH 4.3  DEMENTIA: The dementia remains stable and continues to function adequately in the current living environment with supervision.  The patient has had little changes in behavior. No complications noted from the medications presently being used.  Patient is a poor historian.   HTN: Pt 's HTN remains stable.  Staff Deny CP, sob, DOE, headaches, dizziness or visual disturbances.  No complications from the medications currently being used.  Last BP : 124/64, 100/64, 130/70,  130/80, 120/70, 92/60, 121/86, 114/62, 136/66, 148/70, 148/68, 118/64, 131/81, 160/84  GERD: pt's GERD is stable.  Staff Deny ongoing heartburn, abd. Pain, nausea or vomiting.  Currently on a PPI & tolerates it without any adverse reactions.   PAST MEDICAL HISTORY : Reviewed.  No changes.  CURRENT MEDICATIONS: Reviewed per [facility]  REVIEW OF SYSTEMS:  Difficult to obtain due to dementia.    PHYSICAL EXAMINATION  VS: See vital signs section  GENERAL: no acute distress, moderately obese body habitus NECK: supple, trachea midline, no neck masses, no thyroid tenderness, no thyromegaly RESPIRATORY: breathing is even & unlabored, BS CTAB CARDIAC: RRR, no murmur,no extra heart sounds  EDEMA/VARICOSITIES:  +1 right lower extremity edema and +2 edema in the left lower extremity ARTERIAL:  pedal pulses diminished  GI: abdomen soft, normal BS, no masses, no tenderness, no hepatomegaly,  no splenomegaly PSYCHIATRIC: the patient is alert & disoriented, affect & behavior appropriate  LABS/RADIOLOGY: 7-15 CBC normal 5-15 total protein 5.3, albumin 3.2 otherwise CMP normal, and Depakote level 52 1-15 CBC normal 10-14 CBC normal, total protein 5.7 otherwise CMP normal  5/14 total protein 5.7, albumin 3.3 otherwise liver profile normal, BNP 41.1, CBC normal, albumin 3.4 otherwise CMP normal, hemoglobin A1c 5.4, Depakote level 30 4/14 TSH 4.64, hemoglobin 10.8, MCV 91 otherwise CBC normal, BMP normal 07/2012:  TSH 6.65.    Hemoglobin 10.8, MCV 91, otherwise CBC normal.   BMP normal.   ASSESSMENT/PLAN:  Hypertension. Last BP elevated.  Will review a log.  Dementia.  Advanced.   GERD.  Well controlled.    Hypothyroidism.  Well controlled.   Depression.  On Zoloft  Edema -- stable.  Constipation-adequately controlled.  Allergic rhinitis-stable.  CPT CODE: 40981  Newton Pigg. Kerry Dory, MD [facility] [phone]

## 2014-03-12 ENCOUNTER — Non-Acute Institutional Stay (SKILLED_NURSING_FACILITY): Payer: Medicare Other | Admitting: Internal Medicine

## 2014-03-12 DIAGNOSIS — K219 Gastro-esophageal reflux disease without esophagitis: Secondary | ICD-10-CM

## 2014-03-12 DIAGNOSIS — E039 Hypothyroidism, unspecified: Secondary | ICD-10-CM

## 2014-03-12 DIAGNOSIS — F0390 Unspecified dementia without behavioral disturbance: Secondary | ICD-10-CM

## 2014-03-12 DIAGNOSIS — I10 Essential (primary) hypertension: Secondary | ICD-10-CM

## 2014-03-15 NOTE — Progress Notes (Signed)
Patient ID: Brandy [name], female   DOB: 01/10/1930, 80 y.o.   MRN: [id number]         PROGRESS NOTE  DATE: 03-12-14  FACILITY: [facility]   LEVEL OF CARE: SNF  Routine Visit  CHIEF COMPLAINT:  Manage hypothyroidism, dementia, hypertension and GERD.   HISTORY OF PRESENT ILLNESS:  REASSESSMENT OF ONGOING PROBLEM(S):  HYPOTHYROIDISM: The hypothyroidism remains stable. No complications noted from the medications presently being used.  The staff deny fatigue or constipation.  Last TSH 5.03 on 12/05/12, 8/14 TSH 7.02, 9/14 TSH 4.19, in 5-15 TSH 4.3  DEMENTIA: The dementia remains stable and continues to function adequately in the current living environment with supervision.  The patient has had little changes in behavior. No complications noted from the medications presently being used.  Patient is a poor historian.   HTN: Pt 's HTN remains stable.  Staff Deny CP, sob, DOE, headaches, dizziness or visual disturbances.  No complications from the medications currently being used.  Last BP : 124/64, 100/64, 130/70,  130/80, 120/70, 92/60, 121/86, 114/62, 136/66, 148/70, 148/68, 118/64, 131/81, 160/84, 111/67  GERD: pt's GERD is stable.  Staff Deny ongoing heartburn, abd. Pain, nausea or vomiting.  Currently on a PPI & tolerates it without any adverse reactions.   PAST MEDICAL HISTORY : Reviewed.  No changes.  [facility]  REVIEW OF SYSTEMS:  Difficult to obtain due to dementia.    PHYSICAL EXAMINATION  VS: See vital signs section  GENERAL: no acute distress, moderately obese body habitus NECK: supple, trachea midline, no neck masses, no thyroid tenderness, no thyromegaly RESPIRATORY: breathing is even & unlabored, BS CTAB CARDIAC: RRR, no murmur,no extra heart sounds  EDEMA/VARICOSITIES:  +1 right lower extremity edema and +2 edema in the left lower extremity ARTERIAL:  pedal pulses diminished  GI: abdomen soft, normal BS, no masses, no tenderness, no  hepatomegaly, no splenomegaly PSYCHIATRIC: the patient is alert & disoriented, affect & behavior appropriate  LABS/RADIOLOGY: 7-15 CBC normal 5-15 total protein 5.3, albumin 3.2 otherwise CMP normal, and Depakote level 52 1-15 CBC normal 10-14 CBC normal, total protein 5.7 otherwise CMP normal  5/14 total protein 5.7, albumin 3.3 otherwise liver profile normal, BNP 41.1, CBC normal, albumin 3.4 otherwise CMP normal, hemoglobin A1c 5.4, Depakote level 30 4/14 TSH 4.64, hemoglobin 10.8, MCV 91 otherwise CBC normal, BMP normal 07/2012:  TSH 6.65.    Hemoglobin 10.8, MCV 91, otherwise CBC normal.   BMP normal.   ASSESSMENT/PLAN:  Hypertension. Well controlled  Dementia.  Advanced.   GERD.  Well controlled.    Hypothyroidism.  Well controlled.   Depression.  On Zoloft  Edema -- stable.  Constipation-adequately controlled.  Allergic rhinitis-stable.  CPT CODE: [id number]  Newton PiggGayani Y. Kerry Doryasanayaka, MD [facility] [phone]

## 2014-04-07 ENCOUNTER — Encounter: Payer: Self-pay | Admitting: Internal Medicine

## 2014-06-06 ENCOUNTER — Encounter (HOSPITAL_COMMUNITY): Payer: Self-pay | Admitting: Emergency Medicine

## 2014-06-06 ENCOUNTER — Emergency Department (HOSPITAL_COMMUNITY)
Admission: EM | Admit: 2014-06-06 | Discharge: 2014-06-07 | Disposition: A | Discharge status: Home / Self Care | Payer: Medicare Other | Attending: Emergency Medicine | Admitting: Emergency Medicine

## 2014-06-06 ENCOUNTER — Emergency Department (HOSPITAL_COMMUNITY): Payer: Medicare Other

## 2014-06-06 DIAGNOSIS — G43909 Migraine, unspecified, not intractable, without status migrainosus: Secondary | ICD-10-CM | POA: Diagnosis not present

## 2014-06-06 DIAGNOSIS — W07XXXA Fall from chair, initial encounter: Secondary | ICD-10-CM | POA: Insufficient documentation

## 2014-06-06 DIAGNOSIS — Z79899 Other long term (current) drug therapy: Secondary | ICD-10-CM | POA: Insufficient documentation

## 2014-06-06 DIAGNOSIS — S0542XA Penetrating wound of orbit with or without foreign body, left eye, initial encounter: Secondary | ICD-10-CM | POA: Insufficient documentation

## 2014-06-06 DIAGNOSIS — Y9389 Activity, other specified: Secondary | ICD-10-CM | POA: Insufficient documentation

## 2014-06-06 DIAGNOSIS — Y92199 Unspecified place in other specified residential institution as the place of occurrence of the external cause: Secondary | ICD-10-CM | POA: Insufficient documentation

## 2014-06-06 DIAGNOSIS — Z8619 Personal history of other infectious and parasitic diseases: Secondary | ICD-10-CM | POA: Diagnosis not present

## 2014-06-06 DIAGNOSIS — Y998 Other external cause status: Secondary | ICD-10-CM | POA: Diagnosis not present

## 2014-06-06 DIAGNOSIS — Z87891 Personal history of nicotine dependence: Secondary | ICD-10-CM | POA: Diagnosis not present

## 2014-06-06 DIAGNOSIS — S01112A Laceration without foreign body of left eyelid and periocular area, initial encounter: Secondary | ICD-10-CM | POA: Diagnosis present

## 2014-06-06 DIAGNOSIS — I10 Essential (primary) hypertension: Secondary | ICD-10-CM | POA: Diagnosis not present

## 2014-06-06 DIAGNOSIS — S0181XA Laceration without foreign body of other part of head, initial encounter: Secondary | ICD-10-CM

## 2014-06-06 DIAGNOSIS — Z9981 Dependence on supplemental oxygen: Secondary | ICD-10-CM | POA: Insufficient documentation

## 2014-06-06 DIAGNOSIS — F329 Major depressive disorder, single episode, unspecified: Secondary | ICD-10-CM | POA: Insufficient documentation

## 2014-06-06 DIAGNOSIS — M1990 Unspecified osteoarthritis, unspecified site: Secondary | ICD-10-CM | POA: Diagnosis not present

## 2014-06-06 DIAGNOSIS — Z8639 Personal history of other endocrine, nutritional and metabolic disease: Secondary | ICD-10-CM | POA: Diagnosis not present

## 2014-06-06 DIAGNOSIS — F419 Anxiety disorder, unspecified: Secondary | ICD-10-CM | POA: Diagnosis not present

## 2014-06-06 DIAGNOSIS — G4733 Obstructive sleep apnea (adult) (pediatric): Secondary | ICD-10-CM | POA: Insufficient documentation

## 2014-06-06 DIAGNOSIS — W19XXXA Unspecified fall, initial encounter: Secondary | ICD-10-CM

## 2014-06-06 DIAGNOSIS — F0390 Unspecified dementia without behavioral disturbance: Secondary | ICD-10-CM | POA: Diagnosis not present

## 2014-06-06 DIAGNOSIS — Z8709 Personal history of other diseases of the respiratory system: Secondary | ICD-10-CM | POA: Diagnosis not present

## 2014-06-06 DIAGNOSIS — S0083XA Contusion of other part of head, initial encounter: Secondary | ICD-10-CM

## 2014-06-06 NOTE — ED Notes (Signed)
Pt comes from [facility] where pt fell out of chair around 2000. Staff denies any LOC. Pt has lac to left eye and swelling above and below left eye. Pt has c-collar upon arrival. Pt is alert to self (baseline). Pt not on blood thinners.

## 2014-06-06 NOTE — ED Notes (Signed)
Bed: AV40 Expected date:  Expected time:  Means of arrival:  Comments: EMS, 83 F, Fall

## 2014-06-06 NOTE — ED Provider Notes (Signed)
CSN: [id number]     Arrival date & time 06/06/14  2212 History   First MD Initiated Contact with Patient 06/06/14 2322     Chief Complaint  Patient presents with  . Fall     (Consider location/radiation/quality/duration/timing/severity/associated sxs/prior Treatment) HPI  Level 5 Caveat: dementia. This is an 81 year old female with dementia. She fell out of a chair at her living facility about 8 PM. Staff denied loss of consciousness. She was noted to have a laceration above her left eye with swelling around the left eye. She was placed in a c-collar on arrival. She is reportedly at her baseline mentally. She is not on anticoagulation.  Past Medical History  Diagnosis Date  . Anxiety   . Depression   . Dementia     neuro eval WNL, normal MRI, eval at behavioral [facility] center after admission with dx mild-mod dementia  . HTN (hypertension)   . HLD (hyperlipidemia)   . Allergic rhinitis   . OSA (obstructive sleep apnea)     intermittent CPAP use  . Migraine   . Arthritis   . Herpes zoster 12/29/2006  . Urinary incontinence    Past Surgical History  Procedure Laterality Date  . Appendectomy    . Neck surgery      CALCIUM DEPOSITS  . Abdominal hysterectomy      TAH for fibroids   Family History  Problem Relation Age of Onset  . Heart attack Father     in 61's  . Alcohol abuse Father   . Lymphoma Mother   . Cancer Mother     LYMPHOMA  . Coronary artery disease Maternal Grandmother   . Breast cancer Maternal Grandmother   . Sudden death Maternal Grandfather    History  Substance Use Topics  . Smoking status: Former Smoker -- 2.00 packs/day for 8 years    Types: Cigarettes    Quit date: 06/06/1969  . Smokeless tobacco: Never Used  . Alcohol Use: No   OB History    Gravida Para Term Preterm AB TAB SAB Ectopic Multiple Living   Review of Systems  Unable to perform ROS   Allergies  Other and Sulfonamide derivatives  Home Medications    Prior to Admission medications   Medication Sig Start Date End Date Taking? Authorizing Provider  acetaminophen (TYLENOL) 325 MG tablet Take 650 mg by mouth every 6 (six) hours as needed for mild pain.   Yes Historical Provider, MD  Calcium Carbonate-Vitamin D (CALCIUM 600+D) 600-400 MG-UNIT per tablet Take 2 tablets by mouth daily. For bones   Yes Historical Provider, MD  divalproex (DEPAKOTE SPRINKLE) 125 MG capsule Take 2 capsules (250 mg total) by mouth every 12 (twelve) hours. Patient taking differently: Take 500 mg by mouth every 12 (twelve) hours.  08/20/12  Yes Vassie Loll, MD  fexofenadine (ALLEGRA) 180 MG tablet Take 180 mg by mouth daily. As needed for allergies   Yes Historical Provider, MD  levothyroxine (SYNTHROID, LEVOTHROID) 50 MCG tablet Take 50 mcg by mouth daily before breakfast.   Yes Historical Provider, MD  memantine (NAMENDA) 5 MG tablet Take 5 mg by mouth 2 (two) times daily.   Yes Historical Provider, MD  metoprolol tartrate (LOPRESSOR) 25 MG tablet Take 12.5 mg by mouth daily.   Yes Historical Provider, MD  Multiple Vitamin (MULTIVITAMIN) tablet Take 1 tablet by mouth daily.     Yes Historical Provider, MD  omeprazole (  PRILOSEC) 40 MG capsule Take 1 capsule (40 mg total) by mouth daily. For reflux 12/31/11  Yes Eustaquio Boyden, MD  senna-docusate (SENOKOT-S) 8.6-50 MG per tablet Take 1 tablet by mouth daily.   Yes Historical Provider, MD  sertraline (ZOLOFT) 50 MG tablet Take 50-100 mg by mouth 2 (two) times daily. Take 2 tabs in the morning and 1 tab in the evening   Yes Historical Provider, MD  benztropine (COGENTIN) 1 MG tablet Take 0.5 tablets (0.5 mg total) by mouth daily. 08/20/12   Vassie Loll, MD  buPROPion (WELLBUTRIN) 75 MG tablet Take 100 mg by mouth 2 (two) times daily. 08/20/12   Vassie Loll, MD  donepezil (ARICEPT) 10 MG tablet Take 1 tablet (10 mg total) by mouth at bedtime. Patient not taking: Reported on 06/06/2014 08/09/12   Eustaquio Boyden, MD   feeding supplement (ENSURE COMPLETE) LIQD Take 237 mLs by mouth 2 (two) times daily between meals. Patient not taking: Reported on 06/06/2014 08/20/12   Vassie Loll, MD  metoprolol succinate (TOPROL-XL) 12.5 mg TB24 Take 0.5 tablets (12.5 mg total) by mouth daily. Patient not taking: Reported on 06/06/2014 08/20/12   Vassie Loll, MD  risperiDONE (RISPERDAL) 0.5 MG tablet Take 0.5 mg by mouth 2 (two) times daily.    Historical Provider, MD  topiramate (TOPAMAX) 25 MG tablet Take 1 tablet (25 mg total) by mouth daily. Patient not taking: Reported on 06/06/2014 05/18/12   Eustaquio Boyden, MD   BP 157/79 mmHg  Pulse 72  Temp(Src) 98.7 F (37.1 C) (Oral)  Resp 20  SpO2 95%   Physical Exam  General: Well-developed, well-nourished female in no acute distress; appearance consistent with age of record HENT: normocephalic; small laceration of the left lateral eyebrow, small laceration of the left lateral orbital rim; left periorbital and maxillary ecchymosis and swelling; no hemotympanum Eyes: pupils equal, round and reactive to light Neck: immobilized in cervical collar Heart: regular rate and rhythm Lungs: clear to auscultation bilaterally Abdomen: soft; nondistended; nontender; bowel sounds present Extremities: Arthritic changes; no acute deformity; no tenderness on passive range of motion Neurologic: Somnolent but arousable; oriented to person only; inappropriate answers to questions; noted to move all extremities but exam limited; no facial droop Skin: Warm and dry    ED Course  Procedures (including critical care time)  LACERATION REPAIR Performed by: Hanley Seamen Authorized by: Hanley Seamen Consent: Verbal consent obtained. Risks and benefits: risks, benefits and alternatives were discussed Consent given by: patient Patient identity confirmed: provided demographic data Prepped and Draped in normal sterile fashion Wound explored  Laceration Location: Left lateral  eyebrow  Laceration Length: 1.2 cm  No Foreign Bodies seen or palpated  Anesthesia: none  Irrigation method: syringe Amount of cleaning: standard  Skin closure: Dermabond   Patient tolerance: Patient tolerated the procedure well with no immediate complications.   LACERATION REPAIR Performed by: Hanley Seamen Authorized by: Hanley Seamen Consent: Verbal consent obtained. Risks and benefits: risks, benefits and alternatives were discussed Consent given by: patient Patient identity confirmed: provided demographic data Prepped and Draped in normal sterile fashion Wound explored  Laceration Location: Left lateral orbital rim  Laceration Length: 1 cm  No Foreign Bodies seen or palpated  Anesthesia: none  Irrigation method: syringe Amount of cleaning: standard  Skin closure: Dermabond   Patient tolerance: Patient tolerated the procedure well with no immediate complications.   MDM  Nursing notes and vitals signs, including pulse oximetry, reviewed.  Summary of this visit's results, reviewed by myself:  Labs:  No results found for this or any previous visit (from the past 24 hour(s)).  Imaging Studies: Ct Head Wo Contrast  06/07/2014   CLINICAL DATA:  Left periorbital injury after fall from chair tonight at 2000 hr. Laceration and swelling to the left eye.  EXAM: CT HEAD WITHOUT CONTRAST  CT CERVICAL SPINE WITHOUT CONTRAST  TECHNIQUE: Multidetector CT imaging of the head and cervical spine was performed following the standard protocol without intravenous contrast. Multiplanar CT image reconstructions of the cervical spine were also generated.  COMPARISON:  CT head 10/26/2012  FINDINGS: CT HEAD FINDINGS  Diffuse cerebral atrophy. Ventricular dilatation consistent with central atrophy. Low-attenuation changes in the deep white matter consistent with small vessel ischemia. No mass effect or midline shift. No abnormal extra-axial fluid collections. Gray-white matter junctions  are distinct. Basal cisterns are not effaced. No evidence of acute intracranial hemorrhage. No depressed skull fractures. Visualized paranasal sinuses and mastoid air cells are not opacified. Laceration and soft tissue swelling over the left lateral periorbital region. Left lobe appears intact.  CT CERVICAL SPINE FINDINGS  There is reversal of the usual cervical lordosis. This may be due to patient positioning but ligamentous injury or muscle spasm could also have this appearance and are not excluded. No anterior subluxation. Diffuse degenerative change throughout the cervical spine with narrowed cervical interspaces and associated endplate hypertrophic changes. Degenerative changes throughout the cervical facet joints. C1-2 articulation appears intact. No vertebral compression deformities. No prevertebral soft tissue swelling. No focal bone lesion or bone destruction. Bone cortex and trabecular architecture appear intact. Soft tissues are unremarkable.  IMPRESSION: No acute intracranial abnormalities. Diffuse atrophy and small vessel ischemic changes. Left lateral periorbital laceration and soft tissue swelling.  Nonspecific reversal of the usual cervical lordosis. Diffuse degenerative changes. No displaced fractures identified.   Electronically Signed   By: Burman Nieves M.D.   On: 06/07/2014 00:39   Ct Cervical Spine Wo Contrast  06/07/2014   CLINICAL DATA:  Left periorbital injury after fall from chair tonight at 2000 hr. Laceration and swelling to the left eye.  EXAM: CT HEAD WITHOUT CONTRAST  CT CERVICAL SPINE WITHOUT CONTRAST  TECHNIQUE: Multidetector CT imaging of the head and cervical spine was performed following the standard protocol without intravenous contrast. Multiplanar CT image reconstructions of the cervical spine were also generated.  COMPARISON:  CT head 10/26/2012  FINDINGS: CT HEAD FINDINGS  Diffuse cerebral atrophy. Ventricular dilatation consistent with central atrophy. Low-attenuation  changes in the deep white matter consistent with small vessel ischemia. No mass effect or midline shift. No abnormal extra-axial fluid collections. Gray-white matter junctions are distinct. Basal cisterns are not effaced. No evidence of acute intracranial hemorrhage. No depressed skull fractures. Visualized paranasal sinuses and mastoid air cells are not opacified. Laceration and soft tissue swelling over the left lateral periorbital region. Left lobe appears intact.  CT CERVICAL SPINE FINDINGS  There is reversal of the usual cervical lordosis. This may be due to patient positioning but ligamentous injury or muscle spasm could also have this appearance and are not excluded. No anterior subluxation. Diffuse degenerative change throughout the cervical spine with narrowed cervical interspaces and associated endplate hypertrophic changes. Degenerative changes throughout the cervical facet joints. C1-2 articulation appears intact. No vertebral compression deformities. No prevertebral soft tissue swelling. No focal bone lesion or bone destruction. Bone cortex and trabecular architecture appear intact. Soft tissues are unremarkable.  IMPRESSION: No acute intracranial abnormalities. Diffuse atrophy and small vessel ischemic changes. Left lateral periorbital  laceration and soft tissue swelling.  Nonspecific reversal of the usual cervical lordosis. Diffuse degenerative changes. No displaced fractures identified.   Electronically Signed   By: Burman Nieves M.D.   On: 06/07/2014 00:39      Hanley Seamen, MD 06/07/14 0104

## 2015-10-27 ENCOUNTER — Encounter (HOSPITAL_COMMUNITY): Payer: Self-pay | Admitting: Nurse Practitioner

## 2015-10-27 ENCOUNTER — Emergency Department (HOSPITAL_COMMUNITY): Payer: Medicare Other

## 2015-10-27 ENCOUNTER — Emergency Department (HOSPITAL_COMMUNITY)
Admission: EM | Admit: 2015-10-27 | Discharge: 2015-10-27 | Disposition: A | Discharge status: Home / Self Care | Payer: Medicare Other | Attending: Emergency Medicine | Admitting: Emergency Medicine

## 2015-10-27 DIAGNOSIS — Z79891 Long term (current) use of opiate analgesic: Secondary | ICD-10-CM | POA: Diagnosis not present

## 2015-10-27 DIAGNOSIS — Y939 Activity, unspecified: Secondary | ICD-10-CM | POA: Diagnosis not present

## 2015-10-27 DIAGNOSIS — Y929 Unspecified place or not applicable: Secondary | ICD-10-CM | POA: Insufficient documentation

## 2015-10-27 DIAGNOSIS — Y999 Unspecified external cause status: Secondary | ICD-10-CM | POA: Diagnosis not present

## 2015-10-27 DIAGNOSIS — M1990 Unspecified osteoarthritis, unspecified site: Secondary | ICD-10-CM | POA: Diagnosis not present

## 2015-10-27 DIAGNOSIS — F0390 Unspecified dementia without behavioral disturbance: Secondary | ICD-10-CM | POA: Insufficient documentation

## 2015-10-27 DIAGNOSIS — G4733 Obstructive sleep apnea (adult) (pediatric): Secondary | ICD-10-CM | POA: Diagnosis not present

## 2015-10-27 DIAGNOSIS — Z23 Encounter for immunization: Secondary | ICD-10-CM | POA: Diagnosis not present

## 2015-10-27 DIAGNOSIS — M542 Cervicalgia: Secondary | ICD-10-CM | POA: Insufficient documentation

## 2015-10-27 DIAGNOSIS — S0990XA Unspecified injury of head, initial encounter: Secondary | ICD-10-CM | POA: Diagnosis present

## 2015-10-27 DIAGNOSIS — I10 Essential (primary) hypertension: Secondary | ICD-10-CM | POA: Insufficient documentation

## 2015-10-27 DIAGNOSIS — Z87891 Personal history of nicotine dependence: Secondary | ICD-10-CM | POA: Diagnosis not present

## 2015-10-27 DIAGNOSIS — S0181XA Laceration without foreign body of other part of head, initial encounter: Secondary | ICD-10-CM | POA: Diagnosis not present

## 2015-10-27 DIAGNOSIS — W050XXA Fall from non-moving wheelchair, initial encounter: Secondary | ICD-10-CM | POA: Diagnosis not present

## 2015-10-27 DIAGNOSIS — F329 Major depressive disorder, single episode, unspecified: Secondary | ICD-10-CM | POA: Diagnosis not present

## 2015-10-27 MED ORDER — BACITRACIN ZINC 500 UNIT/GM EX OINT: 1.0000 "application " | TOPICAL_OINTMENT | Freq: Two times a day (BID) | CUTANEOUS | Status: DC

## 2015-10-27 MED ORDER — LIDOCAINE-EPINEPHRINE 1 %-1:100000 IJ SOLN
INTRAMUSCULAR | Status: AC
Start: 2015-10-27 — End: 2015-10-27
  Administered 2015-10-27: 1 mL
  Filled 2015-10-27: qty 1

## 2015-10-27 MED ORDER — LIDOCAINE HCL 1 % IJ SOLN
INTRAMUSCULAR | Status: DC
Start: 2015-10-27 — End: 2015-10-27
  Filled 2015-10-27: qty 20

## 2015-10-27 MED ORDER — TETANUS-DIPHTH-ACELL PERTUSSIS 5-2.5-18.5 LF-MCG/0.5 IM SUSP
0.5000 mL | Freq: Once | INTRAMUSCULAR | Status: AC
  Administered 2015-10-27: 0.5 mL via INTRAMUSCULAR
  Filled 2015-10-27: qty 0.5

## 2015-10-27 MED ORDER — LIDOCAINE-EPINEPHRINE (PF) 2 %-1:200000 IJ SOLN: 10.0000 mL | Freq: Once | INTRAMUSCULAR | Status: DC

## 2015-10-27 NOTE — ED Provider Notes (Signed)
CSN: [id number]     Arrival date & time 10/27/15  1718 History   First MD Initiated Contact with Patient 10/27/15 1737     Chief Complaint  Patient presents with  . Fall  . Head Injury  . Neck Pain   Level V Caveat: Dementia.   Brandy [name] is a 82 y.o. female with a history of dementia who presents to the ED after she fell out of her wheelchair while at [facility] today. Nursing home reports she fell forward out of her wheelchair on accident and hit the right side of her head. No known LOC. She has been behaving at baseline. Baseline is demented. She is not on anticoagulants. No changes to her baseline level of consciousness. Tdap was last in 2004 according to records. No vomiting.    Patient is a 82 y.o. female presenting with fall, head injury, and neck pain. The history is provided by the EMS personnel and the nursing home. No language interpreter was used.  Fall Associated symptoms include neck pain.  Head Injury Associated symptoms: neck pain   Neck Pain   Past Medical History  Diagnosis Date  . Anxiety   . Depression   . Dementia     neuro eval WNL, normal MRI, eval [facility] after admission with dx mild-mod dementia  . HTN (hypertension)   . HLD (hyperlipidemia)   . Allergic rhinitis   . OSA (obstructive sleep apnea)     intermittent CPAP use  . Migraine   . Arthritis   . Herpes zoster 12/29/2006  . Urinary incontinence    Past Surgical History  Procedure Laterality Date  . Appendectomy    . Neck surgery      CALCIUM DEPOSITS  . Abdominal hysterectomy      TAH for fibroids   Family History  Problem Relation Age of Onset  . Heart attack Father     in 70's  . Alcohol abuse Father   . Lymphoma Mother   . Cancer Mother     LYMPHOMA  . Coronary artery disease Maternal Grandmother   . Breast cancer Maternal Grandmother   . Sudden death Maternal Grandfather    Social History  Substance Use Topics  . Smoking status: Former Smoker --  2.00 packs/day for 8 years    Types: Cigarettes    Quit date: 06/06/1969  . Smokeless tobacco: Never Used  . Alcohol Use: No   OB History    Gravida Para Term Preterm AB TAB SAB Ectopic Multiple Living   4 3 3  1  1   3      Review of Systems  Unable to perform ROS: Dementia  Musculoskeletal: Positive for neck pain.      Allergies  Other and Sulfonamide derivatives  Home Medications   Prior to Admission medications   Medication Sig Start Date End Date Taking? Authorizing Provider  Amino Acids-Protein Hydrolys (FEEDING SUPPLEMENT, PRO-STAT SUGAR FREE 64,) LIQD Take 30 mLs by mouth 2 (two) times daily.   Yes Historical Provider, MD  Calcium Carbonate-Vitamin D (CALCIUM 600+D) 600-400 MG-UNIT per tablet Take 2 tablets by mouth daily. For bones   Yes Historical Provider, MD  divalproex (DEPAKOTE SPRINKLE) 125 MG capsule Take 2 capsules (250 mg total) by mouth every 12 (twelve) hours. Patient taking differently: Take 375 mg by mouth 2 (two) times daily.  08/20/12  Yes Vassie Loll, MD  fexofenadine (ALLEGRA) 180 MG tablet Take 180 mg by mouth daily. As needed for  allergies   Yes Historical Provider, MD  levothyroxine (SYNTHROID, LEVOTHROID) 50 MCG tablet Take 50 mcg by mouth daily before breakfast.   Yes Historical Provider, MD  metoprolol tartrate (LOPRESSOR) 25 MG tablet Take 12.5 mg by mouth daily.   Yes Historical Provider, MD  Multiple Vitamin (MULTIVITAMIN) tablet Take 1 tablet by mouth daily.     Yes Historical Provider, MD  omeprazole (PRILOSEC) 40 MG capsule Take 1 capsule (40 mg total) by mouth daily. For reflux 12/31/11  Yes Eustaquio Boyden, MD  senna-docusate (SENOKOT-S) 8.6-50 MG per tablet Take 1 tablet by mouth daily.   Yes Historical Provider, MD  sertraline (ZOLOFT) 100 MG tablet Take 100 mg by mouth daily.   Yes Historical Provider, MD  sertraline (ZOLOFT) 50 MG tablet Take 50 mg by mouth daily.    Yes Historical Provider, MD  bacitracin ointment Apply 1 application  topically 2 (two) times daily. 10/27/15   Everlene Farrier, PA-C  benztropine (COGENTIN) 1 MG tablet Take 0.5 tablets (0.5 mg total) by mouth daily. Patient not taking: Reported on 10/27/2015 08/20/12   Vassie Loll, MD  feeding supplement (ENSURE COMPLETE) LIQD Take 237 mLs by mouth 2 (two) times daily between meals. Patient not taking: Reported on 06/06/2014 08/20/12   Vassie Loll, MD  metoprolol succinate (TOPROL-XL) 12.5 mg TB24 Take 0.5 tablets (12.5 mg total) by mouth daily. Patient not taking: Reported on 06/06/2014 08/20/12   Vassie Loll, MD  topiramate (TOPAMAX) 25 MG tablet Take 1 tablet (25 mg total) by mouth daily. Patient not taking: Reported on 06/06/2014 05/18/12   Eustaquio Boyden, MD   BP 136/70 mmHg  Pulse 84  Temp(Src) 98.2 F (36.8 C) (Axillary)  Resp 20  SpO2 96% Physical Exam  Constitutional: She appears well-developed and well-nourished. No distress.  Nontoxic appearing. Alert.  HENT:  Head: Normocephalic.  Right Ear: External ear normal.  Left Ear: External ear normal.  Nose: Nose normal.  Mouth/Throat: Oropharynx is clear and moist.  4 cm laceration/maceration to right forehead. No other evidence of head trauma.  Bilateral tympanic membranes are pearly-gray without erythema or loss of landmarks.   Eyes: Conjunctivae and EOM are normal. Pupils are equal, round, and reactive to light. Right eye exhibits no discharge. Left eye exhibits no discharge.  Neck: Normal range of motion. Neck supple. No JVD present. No tracheal deviation present.  Cardiovascular: Normal rate, regular rhythm, normal heart sounds and intact distal pulses.  Exam reveals no gallop and no friction rub.   No murmur heard. Pulmonary/Chest: Effort normal and breath sounds normal. No stridor. No respiratory distress. She has no wheezes. She has no rales.  Abdominal: Soft. There is no tenderness.  Musculoskeletal: She exhibits no edema.  Lymphadenopathy:    She has no cervical adenopathy.   Neurological: She is alert. Coordination normal.  The patient is alert but will not respond verbally. The only words she states is that she is cold. She will follow most commands with persistent commands. She is moving all of her extremities with good strength.   Skin: Skin is warm and dry. No rash noted. She is not diaphoretic. No erythema. No pallor.  Psychiatric:  Alert.   Nursing note and vitals reviewed.   ED Course  .Marland KitchenLaceration Repair Date/Time: 10/27/2015 8:00 PM Performed by: Everlene Farrier Authorized by: Everlene Farrier Consent: Verbal consent obtained. Risks and benefits: risks, benefits and alternatives were discussed Consent given by: guardian Patient understanding: patient states understanding of the procedure being performed Patient consent: the patient's  understanding of the procedure matches consent given Procedure consent: procedure consent matches procedure scheduled Relevant documents: relevant documents present and verified Test results: test results available and properly labeled Site marked: the operative site was marked Imaging studies: imaging studies available Required items: required blood products, implants, devices, and special equipment available Patient identity confirmed: verbally with patient Time out: Immediately prior to procedure a "time out" was called to verify the correct patient, procedure, equipment, support staff and site/side marked as required. Body area: head/neck Location details: forehead Laceration length: 4 cm Foreign bodies: no foreign bodies Tendon involvement: none Nerve involvement: none Vascular damage: no Anesthesia: local infiltration Local anesthetic: lidocaine 1% with epinephrine Anesthetic total: 2 ml Patient sedated: no Preparation: Patient was prepped and draped in the usual sterile fashion. Irrigation solution: saline Irrigation method: jet lavage Amount of cleaning: standard Debridement: none Degree of  undermining: none Wound skin closure material used: 5-0 Vicryl rapide  Number of sutures: 4 Technique: simple Approximation: loose Approximation difficulty: simple Dressing: 4x4 sterile gauze and non-adhesive packing strip Patient tolerance: Patient tolerated the procedure well with no immediate complications   (including critical care time) Labs Review Labs Reviewed - No data to display  Imaging Review Ct Head Wo Contrast  10/27/2015  CLINICAL DATA:  Status post fall from wheelchair, with head laceration and neck pain. Confusion. Initial encounter. EXAM: CT HEAD WITHOUT CONTRAST CT CERVICAL SPINE WITHOUT CONTRAST TECHNIQUE: Multidetector CT imaging of the head and cervical spine was performed following the standard protocol without intravenous contrast. Multiplanar CT image reconstructions of the cervical spine were also generated. COMPARISON:  CT of the head and cervical spine performed 06/07/2014 FINDINGS: CT HEAD FINDINGS There is no evidence of acute infarction, mass lesion, or intra- or extra-axial hemorrhage on CT. Prominence of the ventricles and sulci reflects moderate cortical volume loss. Scattered periventricular and subcortical white matter change likely reflects small vessel ischemic microangiopathy. Mild cerebellar atrophy is noted. The brainstem and fourth ventricle are within normal limits. The basal ganglia are unremarkable in appearance. The cerebral hemispheres demonstrate grossly normal gray-white differentiation. No mass effect or midline shift is seen. There is no evidence of fracture; visualized osseous structures are unremarkable in appearance. The visualized portions of the orbits are within normal limits. The paranasal sinuses and mastoid air cells are well-aerated. Mild soft tissue swelling is noted overlying the right frontal calvarium. CT CERVICAL SPINE FINDINGS There is no evidence of fracture or subluxation. Mild multilevel disc space narrowing is noted along the lower  cervical spine, with scattered anterior and posterior disc osteophyte complexes. Vertebral bodies demonstrate normal height and alignment. Prevertebral soft tissues are within normal limits. The thyroid gland is unremarkable in appearance. The visualized lung apices are clear. No significant soft tissue abnormalities are seen. IMPRESSION: 1. No evidence of traumatic intracranial injury or fracture. 2. No evidence of fracture or subluxation along the cervical spine. 3. Mild soft swelling overlying the right frontal calvarium. 4. Moderate cortical volume loss and scattered small vessel ischemic microangiopathy. 5. Mild degenerative change along the lower cervical spine. Electronically Signed   By: Roanna Raider M.D.   On: 10/27/2015 18:33   Ct Cervical Spine Wo Contrast  10/27/2015  CLINICAL DATA:  Status post fall from wheelchair, with head laceration and neck pain. Confusion. Initial encounter. EXAM: CT HEAD WITHOUT CONTRAST CT CERVICAL SPINE WITHOUT CONTRAST TECHNIQUE: Multidetector CT imaging of the head and cervical spine was performed following the standard protocol without intravenous contrast. Multiplanar CT image reconstructions of  the cervical spine were also generated. COMPARISON:  CT of the head and cervical spine performed 06/07/2014 FINDINGS: CT HEAD FINDINGS There is no evidence of acute infarction, mass lesion, or intra- or extra-axial hemorrhage on CT. Prominence of the ventricles and sulci reflects moderate cortical volume loss. Scattered periventricular and subcortical white matter change likely reflects small vessel ischemic microangiopathy. Mild cerebellar atrophy is noted. The brainstem and fourth ventricle are within normal limits. The basal ganglia are unremarkable in appearance. The cerebral hemispheres demonstrate grossly normal gray-white differentiation. No mass effect or midline shift is seen. There is no evidence of fracture; visualized osseous structures are unremarkable in  appearance. The visualized portions of the orbits are within normal limits. The paranasal sinuses and mastoid air cells are well-aerated. Mild soft tissue swelling is noted overlying the right frontal calvarium. CT CERVICAL SPINE FINDINGS There is no evidence of fracture or subluxation. Mild multilevel disc space narrowing is noted along the lower cervical spine, with scattered anterior and posterior disc osteophyte complexes. Vertebral bodies demonstrate normal height and alignment. Prevertebral soft tissues are within normal limits. The thyroid gland is unremarkable in appearance. The visualized lung apices are clear. No significant soft tissue abnormalities are seen. IMPRESSION: 1. No evidence of traumatic intracranial injury or fracture. 2. No evidence of fracture or subluxation along the cervical spine. 3. Mild soft swelling overlying the right frontal calvarium. 4. Moderate cortical volume loss and scattered small vessel ischemic microangiopathy. 5. Mild degenerative change along the lower cervical spine. Electronically Signed   By: Roanna Raider M.D.   On: 10/27/2015 18:33   I have personally reviewed and evaluated these images as part of my medical decision-making.   EKG Interpretation None      Filed Vitals:   10/27/15 1725 10/27/15 2029  BP: 150/82 136/70  Pulse: 85 84  Temp: 98 F (36.7 C) 98.2 F (36.8 C)  TempSrc: Oral Axillary  Resp: 16 20  SpO2: 100% 96%     MDM   Meds given in ED:  Medications  Tdap (BOOSTRIX) injection 0.5 mL (0.5 mLs Intramuscular Given 10/27/15 2012)  lidocaine-EPINEPHrine (XYLOCAINE W/EPI) 1 %-1:100000 (with pres) injection (1 mL  Given 10/27/15 2012)    New Prescriptions   BACITRACIN OINTMENT    Apply 1 application topically 2 (two) times daily.    Final diagnoses:  Fall from wheelchair, initial encounter  Forehead laceration, initial encounter   This is a 82 y.o. female with a history of dementia who presents to the ED after she fell out of  her wheelchair while at [facility] today. Nursing home reports she fell forward out of her wheelchair on accident and hit the right side of her head. No known LOC. She has been behaving at baseline. Tdap was updated in the ED.  Later, the patient's son is at bedside. He reports the patient is been behaving at her baseline today. On exam the patient is afebrile nontoxic appearing. She is a 4 cm laceration/maceration to her right forehead. The patient was completely undressed for exam and there was no further evidence of trauma. The patient is alert, but not oriented, which son reports is baseline.  CT scan of her head and cervical spine are unremarkable. Laceration is a para by me and tolerated well by the patient. Four 5-0 Vicryl Rapide sutures were placed, as well as steri strips for macerated portion. I discussed wound care instructions with the son at bedside. We'll discharge back to skilled nursing facility. I discussed return  precautions with the son. I encouraged him to follow-up with her primary care doctor and apparently 1 week for wound recheck. I advised return to the emergency department with new or worsening symptoms or new concerns. The patient's son verbalized understanding and agreement with plan.       Everlene FarrierWilliam Khilynn Borntreger, PA-C 10/27/15 2055  Loren Raceravid Yelverton, MD 10/27/15 2219

## 2015-10-27 NOTE — ED Notes (Signed)
[facility], reportedly had a fall from her wheel chair, head laceration scant bleeding, neck pain on palpation, confused factoring in her dementia baseline. No reported LOC, MAR from facility does not indicate pt being anticoagulants.

## 2015-10-27 NOTE — ED Notes (Signed)
Bed: WA12 Expected date:  Expected time:  Means of arrival:  Comments: EMS-fall 

## 2015-10-27 NOTE — Discharge Instructions (Signed)
Absorbable sutures and Steri-Strips were used to repair her laceration to her forehead. The sutures do not need to be removed. Please have her follow-up in about 1 week for wound recheck with her primary care doctor. Sooner if problems.   Facial Laceration  A facial laceration is a cut on the face. These injuries can be painful and cause bleeding. Lacerations usually heal quickly, but they need special care to reduce scarring. DIAGNOSIS  Your health care provider will take a medical history, ask for details about how the injury occurred, and examine the wound to determine how deep the cut is. TREATMENT  Some facial lacerations may not require closure. Others may not be able to be closed because of an increased risk of infection. The risk of infection and the chance for successful closure will depend on various factors, including the amount of time since the injury occurred. The wound may be cleaned to help prevent infection. If closure is appropriate, pain medicines may be given if needed. Your health care provider will use stitches (sutures), wound glue (adhesive), or skin adhesive strips to repair the laceration. These tools bring the skin edges together to allow for faster healing and a better cosmetic outcome. If needed, you may also be given a tetanus shot. HOME CARE INSTRUCTIONS  Only take over-the-counter or prescription medicines as directed by your health care provider.  Follow your health care provider's instructions for wound care. These instructions will vary depending on the technique used for closing the wound. For Sutures:  Keep the wound clean and dry.   If you were given a bandage (dressing), you should change it at least once a day. Also change the dressing if it becomes wet or dirty, or as directed by your health care provider.   Wash the wound with soap and water 2 times a day. Rinse the wound off with water to remove all soap. Pat the wound dry with a clean towel.    After cleaning, apply a thin layer of the antibiotic ointment recommended by your health care provider. This will help prevent infection and keep the dressing from sticking.   You may shower as usual after the first 24 hours. Do not soak the wound in water until the sutures are removed.   Get your sutures removed as directed by your health care provider. With facial lacerations, sutures should usually be taken out after 4-5 days to avoid stitch marks.   Wait a few days after your sutures are removed before applying any makeup. For Skin Adhesive Strips:  Keep the wound clean and dry.   Do not get the skin adhesive strips wet. You may bathe carefully, using caution to keep the wound dry.   If the wound gets wet, pat it dry with a clean towel.   Skin adhesive strips will fall off on their own. You may trim the strips as the wound heals. Do not remove skin adhesive strips that are still stuck to the wound. They will fall off in time.  For Wound Adhesive:  You may briefly wet your wound in the shower or bath. Do not soak or scrub the wound. Do not swim. Avoid periods of heavy sweating until the skin adhesive has fallen off on its own. After showering or bathing, gently pat the wound dry with a clean towel.   Do not apply liquid medicine, cream medicine, ointment medicine, or makeup to your wound while the skin adhesive is in place. This may loosen the film before  your wound is healed.   If a dressing is placed over the wound, be careful not to apply tape directly over the skin adhesive. This may cause the adhesive to be pulled off before the wound is healed.   Avoid prolonged exposure to sunlight or tanning lamps while the skin adhesive is in place.  The skin adhesive will usually remain in place for 5-10 days, then naturally fall off the skin. Do not pick at the adhesive film.  After Healing: Once the wound has healed, cover the wound with sunscreen during the day for 1 full  year. This can help minimize scarring. Exposure to ultraviolet light in the first year will darken the scar. It can take 1-2 years for the scar to lose its redness and to heal completely.  SEEK MEDICAL CARE IF:  You have a fever. SEEK IMMEDIATE MEDICAL CARE IF:  You have redness, pain, or swelling around the wound.   You see ayellowish-white fluid (pus) coming from the wound.    This information is not intended to replace advice given to you by your health care provider. Make sure you discuss any questions you have with your health care provider.   Document Released: 06/30/2004 Document Revised: 06/13/2014 Document Reviewed: 01/03/2013 Elsevier Interactive Patient Education 2016 [employer]  Fall Prevention in Hospitals, Adult As a hospital patient, your condition and the treatments you receive can increase your risk for falls. Some additional risk factors for falls in a hospital include:  Being in an unfamiliar environment.  Being on bed rest.  Your surgery.  Taking certain medicines.  Your tubing requirements, such as intravenous (IV) therapy or catheters. It is important that you learn how to decrease fall risks while at the hospital. Below are important tips that can help prevent falls. SAFETY TIPS FOR PREVENTING FALLS Talk about your risk of falling.  Ask your health care provider why you are at risk for falling. Is it your medicine, illness, tubing placement, or something else?  Make a plan with your health care provider to keep you safe from falls.  Ask your health care provider or pharmacist about side effects of your medicines. Some medicines can make you dizzy or affect your coordination. Ask for help.  Ask for help before getting out of bed. You may need to press your call button.  Ask for assistance in getting safely to the toilet.  Ask for a walker or cane to be put at your bedside. Ask that most of the side rails on your bed be placed up before your  health care provider leaves the room.  Ask family or friends to sit with you.  Ask for things that are out of your reach, such as your glasses, hearing aids, telephone, bedside table, or call button. Follow these tips to avoid falling:  Stay lying or seated, rather than standing, while waiting for help.  Wear rubber-soled slippers or shoes whenever you walk in the hospital.  Avoid quick, sudden movements.  Change positions slowly.  Sit on the side of your bed before standing.  Stand up slowly and wait before you start to walk.  Let your health care provider know if there is a spill on the floor.  Pay careful attention to the medical equipment, electrical cords, and tubes around you.  When you need help, use your call button by your bed or in the bathroom. Wait for one of your health care providers to help you.  If you feel dizzy or unsure of  your footing, return to bed and wait for assistance.  Avoid being distracted by the TV, telephone, or another person in your room.  Do not lean or support yourself on rolling objects, such as IV poles or bedside tables.   This information is not intended to replace advice given to you by your health care provider. Make sure you discuss any questions you have with your health care provider.   Document Released: 05/20/2000 Document Revised: 06/13/2014 Document Reviewed: 01/29/2012 Elsevier Interactive Patient Education [employer]

## 2015-10-27 NOTE — ED Notes (Signed)
[facility] called for pt transfer 

## 2016-08-01 ENCOUNTER — Emergency Department (HOSPITAL_COMMUNITY): Payer: Medicare Other

## 2016-08-01 ENCOUNTER — Inpatient Hospital Stay (HOSPITAL_COMMUNITY)
Admission: EM | Admit: 2016-08-01 | Discharge: 2016-08-03 | DRG: 641 | Disposition: A | Discharge status: Skilled Nursing Facility | Payer: Medicare Other | Attending: Internal Medicine | Admitting: Internal Medicine

## 2016-08-01 ENCOUNTER — Encounter (HOSPITAL_COMMUNITY): Payer: Self-pay

## 2016-08-01 DIAGNOSIS — I10 Essential (primary) hypertension: Secondary | ICD-10-CM | POA: Diagnosis not present

## 2016-08-01 DIAGNOSIS — K219 Gastro-esophageal reflux disease without esophagitis: Secondary | ICD-10-CM | POA: Diagnosis present

## 2016-08-01 DIAGNOSIS — E870 Hyperosmolality and hypernatremia: Principal | ICD-10-CM | POA: Diagnosis present

## 2016-08-01 DIAGNOSIS — E878 Other disorders of electrolyte and fluid balance, not elsewhere classified: Secondary | ICD-10-CM | POA: Diagnosis present

## 2016-08-01 DIAGNOSIS — G4733 Obstructive sleep apnea (adult) (pediatric): Secondary | ICD-10-CM | POA: Diagnosis present

## 2016-08-01 DIAGNOSIS — Z79899 Other long term (current) drug therapy: Secondary | ICD-10-CM

## 2016-08-01 DIAGNOSIS — Z882 Allergy status to sulfonamides status: Secondary | ICD-10-CM

## 2016-08-01 DIAGNOSIS — D696 Thrombocytopenia, unspecified: Secondary | ICD-10-CM | POA: Diagnosis present

## 2016-08-01 DIAGNOSIS — Z66 Do not resuscitate: Secondary | ICD-10-CM | POA: Diagnosis present

## 2016-08-01 DIAGNOSIS — F329 Major depressive disorder, single episode, unspecified: Secondary | ICD-10-CM | POA: Diagnosis present

## 2016-08-01 DIAGNOSIS — R531 Weakness: Secondary | ICD-10-CM | POA: Diagnosis not present

## 2016-08-01 DIAGNOSIS — G3183 Dementia with Lewy bodies: Secondary | ICD-10-CM | POA: Diagnosis present

## 2016-08-01 DIAGNOSIS — E039 Hypothyroidism, unspecified: Secondary | ICD-10-CM | POA: Diagnosis not present

## 2016-08-01 DIAGNOSIS — Z87891 Personal history of nicotine dependence: Secondary | ICD-10-CM

## 2016-08-01 DIAGNOSIS — E860 Dehydration: Secondary | ICD-10-CM | POA: Diagnosis present

## 2016-08-01 DIAGNOSIS — Z7401 Bed confinement status: Secondary | ICD-10-CM

## 2016-08-01 DIAGNOSIS — Z91018 Allergy to other foods: Secondary | ICD-10-CM

## 2016-08-01 DIAGNOSIS — E785 Hyperlipidemia, unspecified: Secondary | ICD-10-CM | POA: Diagnosis present

## 2016-08-01 DIAGNOSIS — F0280 Dementia in other diseases classified elsewhere without behavioral disturbance: Secondary | ICD-10-CM | POA: Diagnosis present

## 2016-08-01 LAB — TSH: TSH: 2.018 u[IU]/mL (ref 0.350–4.500)

## 2016-08-01 LAB — CBC WITH DIFFERENTIAL/PLATELET
BASOS PCT: 0 %
Basophils Absolute: 0 10*3/uL (ref 0.0–0.1)
EOS PCT: 1 %
Eosinophils Absolute: 0.1 10*3/uL (ref 0.0–0.7)
HCT: 48.5 % — ABNORMAL HIGH (ref 36.0–46.0)
Hemoglobin: 15.3 g/dL — ABNORMAL HIGH (ref 12.0–15.0)
LYMPHS PCT: 35 %
Lymphs Abs: 3.3 10*3/uL (ref 0.7–4.0)
MCH: 29.9 pg (ref 26.0–34.0)
MCHC: 31.5 g/dL (ref 30.0–36.0)
MCV: 94.9 fL (ref 78.0–100.0)
MONO ABS: 0.8 10*3/uL (ref 0.1–1.0)
Monocytes Relative: 8 %
Neutro Abs: 5.3 10*3/uL (ref 1.7–7.7)
Neutrophils Relative %: 56 %
PLATELETS: 148 10*3/uL — AB (ref 150–400)
RBC: 5.11 MIL/uL (ref 3.87–5.11)
RDW: 14.9 % (ref 11.5–15.5)
WBC: 9.4 10*3/uL (ref 4.0–10.5)

## 2016-08-01 LAB — BASIC METABOLIC PANEL
Anion gap: 7 (ref 5–15)
BUN: 30 mg/dL — ABNORMAL HIGH (ref 6–20)
CALCIUM: 9.3 mg/dL (ref 8.9–10.3)
CO2: 29 mmol/L (ref 22–32)
Chloride: 120 mmol/L — ABNORMAL HIGH (ref 101–111)
Creatinine, Ser: 0.72 mg/dL (ref 0.44–1.00)
GFR calc Af Amer: >60 mL/min (ref >60)
Glucose, Bld: 96 mg/dL (ref 65–99)
Potassium: 4.2 mmol/L (ref 3.5–5.1)
Sodium: 156 mmol/L — ABNORMAL HIGH (ref 135–145)

## 2016-08-01 LAB — VALPROIC ACID LEVEL: VALPROIC ACID LVL: 42 ug/mL — AB (ref 50.0–100.0)

## 2016-08-01 LAB — I-STAT TROPONIN, ED: Troponin i, poc: 0 ng/mL (ref 0.00–0.08)

## 2016-08-01 LAB — AMMONIA: Ammonia: 26 umol/L (ref 9–35)

## 2016-08-01 LAB — MAGNESIUM: Magnesium: 2.5 mg/dL — ABNORMAL HIGH (ref 1.7–2.4)

## 2016-08-01 MED ORDER — VALPROATE SODIUM 500 MG/5ML IV SOLN
250.0000 mg | Freq: Two times a day (BID) | INTRAVENOUS | Status: DC
  Administered 2016-08-02: 250 mg via INTRAVENOUS
  Filled 2016-08-01 (×2): qty 2.5

## 2016-08-01 MED ORDER — LEVOTHYROXINE SODIUM 100 MCG IV SOLR
25.0000 ug | Freq: Every day | INTRAVENOUS | Status: DC
  Administered 2016-08-02: 25 ug via INTRAVENOUS
  Filled 2016-08-01: qty 5

## 2016-08-01 MED ORDER — ACETAMINOPHEN 325 MG PO TABS: 650.0000 mg | ORAL_TABLET | Freq: Four times a day (QID) | ORAL | Status: DC | PRN

## 2016-08-01 MED ORDER — DEXTROSE 5 % IV SOLN
INTRAVENOUS | Status: DC
  Administered 2016-08-01: 21:00:00 via INTRAVENOUS

## 2016-08-01 MED ORDER — ACETAMINOPHEN 650 MG RE SUPP: 650.0000 mg | Freq: Four times a day (QID) | RECTAL | Status: DC | PRN

## 2016-08-01 MED ORDER — ENOXAPARIN SODIUM 40 MG/0.4ML ~~LOC~~ SOLN
40.0000 mg | Freq: Every day | SUBCUTANEOUS | Status: DC
  Administered 2016-08-01 – 2016-08-02 (×2): 40 mg via SUBCUTANEOUS
  Filled 2016-08-01: qty 0.4

## 2016-08-01 NOTE — H&P (Addendum)
History and Physical    Brandy FoilDarlene J [name] ONG:[id number] DOB: 07/09/1929 DOA: 08/01/2016  PCP: Katy ApoPOLITE,RONALD D, MD  Patient coming from: Nursing home.  History obtained from patient's husband.  Chief Complaint: Abnormal labs.  HPI: Brandy [name] is a 83 y.o. female with dementia largely bedbound for the last 3 years was brought to the ER after patient's routine lab showed sodium of 162. In the ER patient's sodium was found to be around 156 with chloride of 120. As per the family patient was given fluids in the nursing home despite which sodium has not improved. Denies any nausea vomiting diarrhea or any change in medications.  ED Course: While in the ER patient had brief episodes of tachycardia but presently monitor shows sinus rhythm with heart rate of 60 bpm. Patient is started on D5W. Patient is oriented to her name and follows commands but patient's family states that she is weak generally does not move her lower extremities.  Review of Systems: As per HPI, rest all negative.   Past Medical History:  Diagnosis Date  . Allergic rhinitis   . Anxiety   . Arthritis   . Dementia    neuro eval WNL, normal MRI, eval at behavioral health center after admission with dx mild-mod dementia  . Depression   . Herpes zoster 12/29/2006  . HLD (hyperlipidemia)   . HTN (hypertension)   . Migraine   . OSA (obstructive sleep apnea)    intermittent CPAP use  . Urinary incontinence     Past Surgical History:  Procedure Laterality Date  . ABDOMINAL HYSTERECTOMY     TAH for fibroids  . APPENDECTOMY    . NECK SURGERY     CALCIUM DEPOSITS     reports that she quit smoking about 47 years ago. Her smoking use included Cigarettes. She has a 16.00 pack-year smoking history. She has never used smokeless tobacco. She reports that she does not drink alcohol or use drugs.  Allergies  Allergen Reactions  . Other Other (See Comments)    CRANBERRY JUICE  . Sulfonamide Derivatives Other (See  Comments)    REACTION: Can't remember    Family History  Problem Relation Age of Onset  . Heart attack Father     in 4260's  . Alcohol abuse Father   . Lymphoma Mother   . Cancer Mother     LYMPHOMA  . Coronary artery disease Maternal Grandmother   . Breast cancer Maternal Grandmother   . Sudden death Maternal Grandfather     Prior to Admission medications   Medication Sig Start Date End Date Taking? Authorizing Provider  Calcium Carbonate-Vitamin D (CALCIUM 600+D) 600-400 MG-UNIT per tablet Take 2 tablets by mouth daily. For bones   Yes Historical Provider, MD  divalproex (DEPAKOTE SPRINKLE) 125 MG capsule Take 2 capsules (250 mg total) by mouth every 12 (twelve) hours. Patient taking differently: Take 375 mg by mouth 2 (two) times daily.  08/20/12  Yes Vassie Lollarlos Madera, MD  levothyroxine (SYNTHROID, LEVOTHROID) 50 MCG tablet Take 50 mcg by mouth daily before breakfast.   Yes Historical Provider, MD  metoprolol tartrate (LOPRESSOR) 25 MG tablet Take 12.5 mg by mouth daily.   Yes Historical Provider, MD  Multiple Vitamin (MULTIVITAMIN) tablet Take 1 tablet by mouth daily.     Yes Historical Provider, MD  multivitamin-lutein (OCUVITE-LUTEIN) CAPS capsule Take 1 capsule by mouth daily.   Yes Historical Provider, MD  omeprazole (PRILOSEC) 40 MG capsule Take 1 capsule (40 mg total) by  mouth daily. For reflux 12/31/11  Yes Eustaquio Boyden, MD  senna-docusate (SENOKOT-S) 8.6-50 MG per tablet Take 1 tablet by mouth at bedtime.    Yes Historical Provider, MD  sertraline (ZOLOFT) 100 MG tablet Take 100 mg by mouth daily.   Yes Historical Provider, MD  ciprofloxacin (CIPRO) 250 MG tablet Take 250 mg by mouth 2 (two) times daily.    Historical Provider, MD  nitrofurantoin, macrocrystal-monohydrate, (MACROBID) 100 MG capsule Take 100 mg by mouth 2 (two) times daily.    Historical Provider, MD    Physical Exam: Vitals:   08/01/16 1900 08/01/16 1930 08/01/16 2030 08/01/16 2100  BP: 95/63 113/64  105/65 113/60  Pulse: 63 62 63 (!) 57  Resp: 15 16 14 12   Temp:      TempSrc:      SpO2: 92% 93% 93% 94%      Constitutional: Moderately built and nourished. Vitals:   08/01/16 1900 08/01/16 1930 08/01/16 2030 08/01/16 2100  BP: 95/63 113/64 105/65 113/60  Pulse: 63 62 63 (!) 57  Resp: 15 16 14 12   Temp:      TempSrc:      SpO2: 92% 93% 93% 94%   Eyes: Anicteric no pallor. ENMT: No discharge from the ears eyes nose and mouth. Neck: No mass felt. No neck rigidity. Respiratory: No rhonchi or crepitations. Cardiovascular: S1-S2 heard no murmurs appreciated. Abdomen: Soft nontender bowel sounds present. Musculoskeletal: No edema. No joint effusion. Skin: No rash. Skin appears warm. Neurologic: Alert awake oriented to her name. Patient has difficulty moving her extremities which patient's family states is chronic. Psychiatric: Patient is oriented to name.   Labs on Admission: I have personally reviewed following labs and imaging studies  CBC:  Recent Labs Lab 08/01/16 1831  WBC 9.4  NEUTROABS 5.3  HGB 15.3*  HCT 48.5*  MCV 94.9  PLT 148*   Basic Metabolic Panel:  Recent Labs Lab 08/01/16 1831  NA 156*  K 4.2  CL 120*  CO2 29  GLUCOSE 96  BUN 30*  CREATININE 0.72  CALCIUM 9.3  MG 2.5*   GFR: CrCl cannot be calculated (Unknown ideal weight.). Liver Function Tests: No results for input(s): AST, ALT, ALKPHOS, BILITOT, PROT, ALBUMIN in the last 168 hours. No results for input(s): LIPASE, AMYLASE in the last 168 hours. No results for input(s): AMMONIA in the last 168 hours. Coagulation Profile: No results for input(s): INR, PROTIME in the last 168 hours. Cardiac Enzymes: No results for input(s): CKTOTAL, CKMB, CKMBINDEX, TROPONINI in the last 168 hours. BNP (last 3 results) No results for input(s): PROBNP in the last 8760 hours. HbA1C: No results for input(s): HGBA1C in the last 72 hours. CBG: No results for input(s): GLUCAP in the last 168  hours. Lipid Profile: No results for input(s): CHOL, HDL, LDLCALC, TRIG, CHOLHDL, LDLDIRECT in the last 72 hours. Thyroid Function Tests: No results for input(s): TSH, T4TOTAL, FREET4, T3FREE, THYROIDAB in the last 72 hours. Anemia Panel: No results for input(s): VITAMINB12, FOLATE, FERRITIN, TIBC, IRON, RETICCTPCT in the last 72 hours. Urine analysis:    Component Value Date/Time   COLORURINE AMBER (A) 08/13/2012 1225   APPEARANCEUR CLOUDY (A) 08/13/2012 1225   LABSPEC 1.020 08/13/2012 1225   PHURINE 6.0 08/13/2012 1225   GLUCOSEU NEGATIVE 08/13/2012 1225   HGBUR MODERATE (A) 08/13/2012 1225   HGBUR negative 04/20/2010 0755   BILIRUBINUR NEGATIVE 08/13/2012 1225   BILIRUBINUR neg 12/26/2011 1627   KETONESUR NEGATIVE 08/13/2012 1225   PROTEINUR 100 (A)  08/13/2012 1225   UROBILINOGEN 2.0 (H) 08/13/2012 1225   NITRITE NEGATIVE 08/13/2012 1225   LEUKOCYTESUR MODERATE (A) 08/13/2012 1225   Sepsis Labs: @LABRCNTIP (procalcitonin:4,lacticidven:4) )No results found for this or any previous visit (from the past 240 hour(s)).   Radiological Exams on Admission: Dg Chest Port 1 View  Result Date: 08/01/2016 CLINICAL DATA:  Hypernatremia and irregular heart rate EXAM: PORTABLE CHEST 1 VIEW COMPARISON:  Chest radiograph 08/18/2012 FINDINGS: Cardiomediastinal contours are unchanged. No pulmonary edema or airspace consolidation. No pneumothorax or pleural effusion. IMPRESSION: No active disease. Electronically Signed   By: Deatra Robinson M.D.   On: 08/01/2016 18:57    EKG: Independently reviewed. Normal sinus rhythm.  Assessment/Plan Principal Problem:   Hypernatremia Active Problems:   Essential hypertension   Hypothyroidism    1. Hypernatremia - probably from dehydration and poor oral intake. I have requested speech therapist evaluation. I have placed patient on D5W. Follow metabolic panel. 2. Hypertension on metoprolol - since patient will be nothing by mouth until speech therapist  evaluation I have placed patient on scheduled dose of iv metoprolol with holding orders. 3. Hypothyroidism on Synthroid which will be dosed IV until patient did take orally. 4. History of dementia and depression - Depakote will be ordered IV until patient can take orally. Check ammonia level since patient also was found to be mildly lethargic. Valproate levels were within acceptable levels. 5. Thrombocytopenia - follow CBC.   DVT prophylaxis: Lovenox. Code Status: DO NOT RESUSCITATE.  Family Communication: Patient's husband.  Disposition Plan: Back to nursing home.  Consults called: None.  Admission status: Observation.    Eduard Clos MD [facility] Pager [phone].  If 7PM-7AM, please contact night-coverage [email or web address] Password [facility]  08/01/2016, 9:40 PM

## 2016-08-01 NOTE — ED Provider Notes (Signed)
[facility]-EMERGENCY DEPT Provider Note   CSN: [id number] Arrival date & time: 08/01/16  1815     History   Chief Complaint Chief Complaint  Patient presents with  . Abnormal Lab    NA high    HPI Brandy [name] is a 83 y.o. female.  The history is provided by the nursing home, a relative and the spouse.    Level V caveat: Dementia 83 year old female with history of allergic rhinitis, anxiety, arthritis, dementia, depression, hyperlipidemia, hypertension, migraine headaches, baseline urinary incontinence, presenting to the ED with abnormal labs. Patient's grandson is at bedside providing additional history. He reports patient has difficulty taking fluids as she is on thickened liquids due to dysphagia and trouble with aspiration. States over the past week her sodium has been running high, but they've been monitoring it closely. On the last recheck by Facility, sodium was 162 and she was sent to the ED.  grandson reports she is at her baseline mental status. No recent changes in her medications. No witnessed seizure activity. Patient does have active DO NOT RESUSCITATE, however arrives without Renette Butters take it. This was confirmed with her husband who confirms she is to remain DNR, he is on the way to the hospital currently.  Past Medical History:  Diagnosis Date  . Allergic rhinitis   . Anxiety   . Arthritis   . Dementia    neuro eval WNL, normal MRI, eval at behavioral health center after admission with dx mild-mod dementia  . Depression   . Herpes zoster 12/29/2006  . HLD (hyperlipidemia)   . HTN (hypertension)   . Migraine   . OSA (obstructive sleep apnea)    intermittent CPAP use  . Urinary incontinence     Patient Active Problem List   Diagnosis Date Noted  . Pneumonia, organism unspecified(486) 07/13/2013  . Unspecified urinary incontinence 02/16/2013  . Psychosis 12/24/2012  . Hypothyroidism 10/18/2012  . Senile dementia, uncomplicated 10/18/2012  . Essential  hypertension, benign 10/18/2012  . Dysphagia, unspecified(787.20) 08/15/2012  . Adult failure to thrive 08/15/2012  . Unspecified constipation 08/15/2012  . Encephalopathy, metabolic 08/14/2012  . UTI (lower urinary tract infection) 08/13/2012  . Urge incontinence 12/27/2011  . Leg swelling 12/27/2011  . Ganglion cyst 06/25/2011  . Chest congestion 06/25/2011  . Thoracic back pain 04/26/2011  . GERD 11/16/2009  . OBSTRUCTIVE SLEEP APNEA 04/17/2009  . Dementia 04/08/2009  . ANXIETY DEPRESSION 09/26/2008  . HYPERLIPIDEMIA 03/26/2008  . COMMON MIGRAINE 03/26/2008  . HYPERTENSION 03/26/2008  . ALLERGIC RHINITIS 03/26/2008    Past Surgical History:  Procedure Laterality Date  . ABDOMINAL HYSTERECTOMY     TAH for fibroids  . APPENDECTOMY    . NECK SURGERY     CALCIUM DEPOSITS    OB History    Gravida Para Term Preterm AB Living   4 3 3   1 3    SAB TAB Ectopic Multiple Live Births   1       3       Home Medications    Prior to Admission medications   Medication Sig Start Date End Date Taking? Authorizing Provider  Amino Acids-Protein Hydrolys (FEEDING SUPPLEMENT, PRO-STAT SUGAR FREE 64,) LIQD Take 30 mLs by mouth 2 (two) times daily.    Historical Provider, MD  bacitracin ointment Apply 1 application topically 2 (two) times daily. 10/27/15   Everlene Farrier, PA-C  benztropine (COGENTIN) 1 MG tablet Take 0.5 tablets (0.5 mg total) by mouth daily. Patient not taking: Reported on 10/27/2015  08/20/12   Vassie Loll, MD  Calcium Carbonate-Vitamin D (CALCIUM 600+D) 600-400 MG-UNIT per tablet Take 2 tablets by mouth daily. For bones    Historical Provider, MD  divalproex (DEPAKOTE SPRINKLE) 125 MG capsule Take 2 capsules (250 mg total) by mouth every 12 (twelve) hours. Patient taking differently: Take 375 mg by mouth 2 (two) times daily.  08/20/12   Vassie Loll, MD  feeding supplement (ENSURE COMPLETE) LIQD Take 237 mLs by mouth 2 (two) times daily between meals. Patient not  taking: Reported on 06/06/2014 08/20/12   Vassie Loll, MD  fexofenadine (ALLEGRA) 180 MG tablet Take 180 mg by mouth daily. As needed for allergies    Historical Provider, MD  levothyroxine (SYNTHROID, LEVOTHROID) 50 MCG tablet Take 50 mcg by mouth daily before breakfast.    Historical Provider, MD  metoprolol succinate (TOPROL-XL) 12.5 mg TB24 Take 0.5 tablets (12.5 mg total) by mouth daily. Patient not taking: Reported on 06/06/2014 08/20/12   Vassie Loll, MD  metoprolol tartrate (LOPRESSOR) 25 MG tablet Take 12.5 mg by mouth daily.    Historical Provider, MD  Multiple Vitamin (MULTIVITAMIN) tablet Take 1 tablet by mouth daily.      Historical Provider, MD  omeprazole (PRILOSEC) 40 MG capsule Take 1 capsule (40 mg total) by mouth daily. For reflux 12/31/11   Eustaquio Boyden, MD  senna-docusate (SENOKOT-S) 8.6-50 MG per tablet Take 1 tablet by mouth daily.    Historical Provider, MD  sertraline (ZOLOFT) 100 MG tablet Take 100 mg by mouth daily.    Historical Provider, MD  sertraline (ZOLOFT) 50 MG tablet Take 50 mg by mouth daily.     Historical Provider, MD  topiramate (TOPAMAX) 25 MG tablet Take 1 tablet (25 mg total) by mouth daily. Patient not taking: Reported on 06/06/2014 05/18/12   Eustaquio Boyden, MD    Family History Family History  Problem Relation Age of Onset  . Heart attack Father     in 70's  . Alcohol abuse Father   . Lymphoma Mother   . Cancer Mother     LYMPHOMA  . Coronary artery disease Maternal Grandmother   . Breast cancer Maternal Grandmother   . Sudden death Maternal Grandfather     Social History Social History  Substance Use Topics  . Smoking status: Former Smoker    Packs/day: 2.00    Years: 8.00    Types: Cigarettes    Quit date: 06/06/1969  . Smokeless tobacco: Never Used  . Alcohol use No     Allergies   Other and Sulfonamide derivatives   Review of Systems Review of Systems  Unable to perform ROS: Dementia     Physical Exam Updated Vital  Signs BP (!) 110/49 (BP Location: Left Arm)   Pulse 64   Temp 97.8 F (36.6 C) (Oral)   Resp 17   SpO2 93%   Physical Exam  Constitutional: She appears well-developed and well-nourished.  HENT:  Head: Normocephalic and atraumatic.  Mouth/Throat: Oropharynx is clear and moist.  Eyes: Conjunctivae and EOM are normal. Pupils are equal, round, and reactive to light.  Neck: Normal range of motion.  Cardiovascular: Normal rate, regular rhythm and normal heart sounds.   HR initially in the 60's during start of exam, brief period of tachycardia in the 120-130 range for about 20 seconds before returning to 60's range  Pulmonary/Chest: Effort normal and breath sounds normal.  Abdominal: Soft. Bowel sounds are normal.  Musculoskeletal: Normal range of motion.  Neurological: She is alert.  Awake, alert, answers some questions and follows some commands when prompted, no tremors or seizure activity, speech is clear (baseline per grandson)  Skin: Skin is warm and dry.  Psychiatric: She has a normal mood and affect.  Nursing note and vitals reviewed.    ED Treatments / Results  Labs (all labs ordered are listed, but only abnormal results are displayed) Labs Reviewed  CBC WITH DIFFERENTIAL/PLATELET - Abnormal; Notable for the following:       Result Value   Hemoglobin 15.3 (*)    HCT 48.5 (*)    Platelets 148 (*)    All other components within normal limits  BASIC METABOLIC PANEL - Abnormal; Notable for the following:    Sodium 156 (*)    Chloride 120 (*)    BUN 30 (*)    All other components within normal limits  MAGNESIUM - Abnormal; Notable for the following:    Magnesium 2.5 (*)    All other components within normal limits  VALPROIC ACID LEVEL - Abnormal; Notable for the following:    Valproic Acid Lvl 42 (*)    All other components within normal limits  I-STAT TROPOININ, ED    EKG  EKG Interpretation None       Radiology Dg Chest Port 1 View  Result Date:  08/01/2016 CLINICAL DATA:  Hypernatremia and irregular heart rate EXAM: PORTABLE CHEST 1 VIEW COMPARISON:  Chest radiograph 08/18/2012 FINDINGS: Cardiomediastinal contours are unchanged. No pulmonary edema or airspace consolidation. No pneumothorax or pleural effusion. IMPRESSION: No active disease. Electronically Signed   By: Deatra RobinsonKevin  Herman M.D.   On: 08/01/2016 18:57    Procedures Procedures (including critical care time)  CRITICAL CARE Performed by: Garlon HatchetSANDERS, Christophe Rising M   Total critical care time: 40 minutes  Critical care time was exclusive of separately billable procedures and treating other patients.  Critical care was necessary to treat or prevent imminent or life-threatening deterioration.  Critical care was time spent personally by me on the following activities: development of treatment plan with patient and/or surrogate as well as nursing, discussions with consultants, evaluation of patient's response to treatment, examination of patient, obtaining history from patient or surrogate, ordering and performing treatments and interventions, ordering and review of laboratory studies, ordering and review of radiographic studies, pulse oximetry and re-evaluation of patient's condition.   Medications Ordered in ED Medications - No data to display   Initial Impression / Assessment and Plan / ED Course  I have reviewed the triage vital signs and the nursing notes.  Pertinent labs & imaging results that were available during my care of the patient were reviewed by me and considered in my medical decision making (see chart for details).  83 year old female sent here from facility for hypernatremia. Grandson at bedside providing additional history. He reports sodium has been high over the past week, they were hoping this has improved, however it has worsened so she was sent here. He reports she is at her baseline mental status. She answers questions and follows some commands when prompted. Repeat  lab work here with continued hypernatremia at 156. Chloride is also high at 130. Magnesium mildly high at 2.5 as well.  Depakote level just under therapeutic level at 42.  Discussed with hospitalist, Dr. Eilene GhaziKakarandy, will admit for ongoing care.  Advised to start D5W at 100cc/hr which was done.  Family updated with care plan.    Final Clinical Impressions(s) / ED Diagnoses   Final diagnoses:  Hypernatremia  Hyperchloremia  Hypermagnesemia  New Prescriptions New Prescriptions   No medications on file     Oletha Blend 08/01/16 2041    Canary Brim Tegeler, MD 08/02/16 1130

## 2016-08-01 NOTE — ED Triage Notes (Signed)
[facility] Pt MD states she needs to go to the hospital because her sodium is 162 per their results. Pt has dementia and is oriented per base line

## 2016-08-01 NOTE — ED Notes (Signed)
Attempted report x1. 

## 2016-08-02 DIAGNOSIS — Z7401 Bed confinement status: Secondary | ICD-10-CM | POA: Diagnosis not present

## 2016-08-02 DIAGNOSIS — Z87891 Personal history of nicotine dependence: Secondary | ICD-10-CM | POA: Diagnosis not present

## 2016-08-02 DIAGNOSIS — Z882 Allergy status to sulfonamides status: Secondary | ICD-10-CM | POA: Diagnosis not present

## 2016-08-02 DIAGNOSIS — R531 Weakness: Secondary | ICD-10-CM | POA: Diagnosis present

## 2016-08-02 DIAGNOSIS — Z66 Do not resuscitate: Secondary | ICD-10-CM | POA: Diagnosis present

## 2016-08-02 DIAGNOSIS — E039 Hypothyroidism, unspecified: Secondary | ICD-10-CM | POA: Diagnosis present

## 2016-08-02 DIAGNOSIS — I10 Essential (primary) hypertension: Secondary | ICD-10-CM | POA: Diagnosis present

## 2016-08-02 DIAGNOSIS — E870 Hyperosmolality and hypernatremia: Secondary | ICD-10-CM | POA: Diagnosis present

## 2016-08-02 DIAGNOSIS — F0280 Dementia in other diseases classified elsewhere without behavioral disturbance: Secondary | ICD-10-CM

## 2016-08-02 DIAGNOSIS — Z79899 Other long term (current) drug therapy: Secondary | ICD-10-CM | POA: Diagnosis not present

## 2016-08-02 DIAGNOSIS — F329 Major depressive disorder, single episode, unspecified: Secondary | ICD-10-CM | POA: Diagnosis present

## 2016-08-02 DIAGNOSIS — D696 Thrombocytopenia, unspecified: Secondary | ICD-10-CM | POA: Diagnosis present

## 2016-08-02 DIAGNOSIS — E860 Dehydration: Secondary | ICD-10-CM | POA: Diagnosis present

## 2016-08-02 DIAGNOSIS — Z91018 Allergy to other foods: Secondary | ICD-10-CM | POA: Diagnosis not present

## 2016-08-02 DIAGNOSIS — E785 Hyperlipidemia, unspecified: Secondary | ICD-10-CM | POA: Diagnosis present

## 2016-08-02 DIAGNOSIS — E878 Other disorders of electrolyte and fluid balance, not elsewhere classified: Secondary | ICD-10-CM | POA: Diagnosis present

## 2016-08-02 DIAGNOSIS — G4733 Obstructive sleep apnea (adult) (pediatric): Secondary | ICD-10-CM | POA: Diagnosis present

## 2016-08-02 DIAGNOSIS — G3183 Dementia with Lewy bodies: Secondary | ICD-10-CM

## 2016-08-02 DIAGNOSIS — K219 Gastro-esophageal reflux disease without esophagitis: Secondary | ICD-10-CM | POA: Diagnosis present

## 2016-08-02 LAB — CBC
HCT: 43.2 % (ref 36.0–46.0)
Hemoglobin: 13.9 g/dL (ref 12.0–15.0)
MCH: 30.5 pg (ref 26.0–34.0)
MCHC: 32.2 g/dL (ref 30.0–36.0)
MCV: 94.9 fL (ref 78.0–100.0)
PLATELETS: 115 10*3/uL — AB (ref 150–400)
RBC: 4.55 MIL/uL (ref 3.87–5.11)
RDW: 14.9 % (ref 11.5–15.5)
WBC: 6.7 10*3/uL (ref 4.0–10.5)

## 2016-08-02 LAB — BASIC METABOLIC PANEL
Anion gap: 8 (ref 5–15)
BUN: 27 mg/dL — AB (ref 6–20)
CHLORIDE: 118 mmol/L — AB (ref 101–111)
CO2: 28 mmol/L (ref 22–32)
CREATININE: 0.62 mg/dL (ref 0.44–1.00)
Calcium: 8.8 mg/dL — ABNORMAL LOW (ref 8.9–10.3)
Glucose, Bld: 121 mg/dL — ABNORMAL HIGH (ref 65–99)
Potassium: 3.5 mmol/L (ref 3.5–5.1)
SODIUM: 154 mmol/L — AB (ref 135–145)

## 2016-08-02 LAB — GLUCOSE, CAPILLARY
GLUCOSE-CAPILLARY: 106 mg/dL — AB (ref 65–99)
GLUCOSE-CAPILLARY: 95 mg/dL (ref 65–99)
Glucose-Capillary: 119 mg/dL — ABNORMAL HIGH (ref 65–99)
Glucose-Capillary: 120 mg/dL — ABNORMAL HIGH (ref 65–99)
Glucose-Capillary: 90 mg/dL (ref 65–99)

## 2016-08-02 LAB — MRSA PCR SCREENING: MRSA by PCR: NEGATIVE

## 2016-08-02 MED ORDER — DIVALPROEX SODIUM 125 MG PO CSDR
375.0000 mg | DELAYED_RELEASE_CAPSULE | Freq: Two times a day (BID) | ORAL | Status: DC
  Administered 2016-08-03: 375 mg via ORAL
  Filled 2016-08-02 (×2): qty 3

## 2016-08-02 MED ORDER — DIVALPROEX SODIUM 125 MG PO CPSP: 375.0000 mg | ORAL_CAPSULE | Freq: Two times a day (BID) | ORAL | Status: DC

## 2016-08-02 MED ORDER — METOPROLOL TARTRATE 12.5 MG HALF TABLET
12.5000 mg | ORAL_TABLET | Freq: Every day | ORAL | Status: DC
  Administered 2016-08-02 – 2016-08-03 (×2): 12.5 mg via ORAL
  Filled 2016-08-02 (×2): qty 1

## 2016-08-02 MED ORDER — SERTRALINE HCL 100 MG PO TABS
100.0000 mg | ORAL_TABLET | Freq: Every day | ORAL | Status: DC
  Administered 2016-08-02 – 2016-08-03 (×2): 100 mg via ORAL
  Filled 2016-08-02 (×2): qty 1

## 2016-08-02 MED ORDER — METOPROLOL TARTRATE 5 MG/5ML IV SOLN
2.5000 mg | Freq: Two times a day (BID) | INTRAVENOUS | Status: DC
  Administered 2016-08-02: 2.5 mg via INTRAVENOUS
  Filled 2016-08-02: qty 5

## 2016-08-02 MED ORDER — LEVOTHYROXINE SODIUM 50 MCG PO TABS
50.0000 ug | ORAL_TABLET | Freq: Every day | ORAL | Status: DC
  Administered 2016-08-03: 50 ug via ORAL
  Filled 2016-08-02: qty 1

## 2016-08-02 NOTE — Clinical Social Work Note (Signed)
Clinical Social Work Assessment  Patient Details  Name: Brandy [name] MRN: [id number] Date of Birth: 10/09/1929  Date of referral:  08/02/16               Reason for consult:  Facility Placement                Permission sought to share information with:  Family Supports Permission granted to share information::  No (Patient is not able to communicate)  Name::     Brandy [name]  Agency::     Relationship::  Spouse  Contact Information:  [phone]  Housing/Transportation Living arrangements for the past 2 months:  Skilled Nursing Facility Source of Information:  Spouse Patient Interpreter Needed:  None Criminal Activity/Legal Involvement Pertinent to Current Situation/Hospitalization:  No - Comment as needed Significant Relationships:  Adult Children, Spouse Lives with:  Facility Resident Do you feel safe going back to the place where you live?  No Need for family participation in patient care:  Yes (Comment)  Care giving concerns:  Patient's spouse - Brandy LawsKenneth Henkin expressed concern about [facility]  Social Worker assessment / plan:  CSW Intern spoke with patient's husband Brandy LawsKenneth Korpi about discharge plans. Brandy [name] was sitting up in bed unable to participate in conversation about her placement.  [facility].  Brandy [name] stated that he is concerned about the care that his wife has been receiving at the facility and expressed interest in [facility] and [facility].  Per Brandy [name], patient's children are agreeable to moving their mother to another facility.  CSW Intern explained facility search process to Brandy [name] and will follow up with him regarding acceptances.  Brandy [name] was appreciative of CSW Intern's involvement with his wife's care and is expecting a follow up call today.    Employment status:  Retired Health and safety inspectornsurance information:  Medicare PT Recommendations:  Not assessed at  this time Information / Referral to community resources:  Skilled Nursing Facility  Patient/Family's Response to care:  Patient's spouse expressed no concerns regarding hospital stay.   Patient/Family's Understanding of and Emotional Response to Diagnosis, Current Treatment, and Prognosis:  Not discussed  Emotional Assessment Appearance:  Appears stated age Attitude/Demeanor/Rapport:  Unable to Assess Affect (typically observed):  Unable to Assess Orientation:  Oriented to Self Alcohol / Substance use:  Not Applicable Psych involvement (Current and /or in the community):  No (Comment)  Discharge Needs  Concerns to be addressed:  Discharge Planning Concerns Readmission within the last 30 days:    Current discharge risk:  None Barriers to Discharge:  No Barriers Identified   Renard HamperLecretia Clotine [name], Student-Social Work 08/02/2016, 2:56 PM

## 2016-08-02 NOTE — Evaluation (Signed)
Clinical/Bedside Swallow Evaluation Patient Details  Name: Brandy [name] MRN: [id number] Date of Birth: 02/18/1930  Today's Date: 08/02/2016 Time: SLP Start Time (ACUTE ONLY): 4098 SLP Stop Time (ACUTE ONLY): 0949 SLP Time Calculation (min) (ACUTE ONLY): 12 min  Past Medical History:  Past Medical History:  Diagnosis Date  . Allergic rhinitis   . Anxiety   . Arthritis   . Dementia    neuro eval WNL, normal MRI, eval at behavioral health center after admission with dx mild-mod dementia  . Depression   . Herpes zoster 12/29/2006  . HLD (hyperlipidemia)   . HTN (hypertension)   . Migraine   . OSA (obstructive sleep apnea)    intermittent CPAP use  . Urinary incontinence    Past Surgical History:  Past Surgical History:  Procedure Laterality Date  . ABDOMINAL HYSTERECTOMY     TAH for fibroids  . APPENDECTOMY    . NECK SURGERY     CALCIUM DEPOSITS   HPI:  Ptis an 83 y.o.femalewith PMH significant for dementia, anxiety, arthritis, depression, HLD, HTN, and OSA. Largely bedbound for the last 3 years. Brought to ER after patient's routine lab showed sodium of 162. In the ER patient's sodium was found to be around 156 with chloride of 120. CXR showed no active disease.   Assessment / Plan / Recommendation Clinical Impression  Ms. Welden appeared drowsy, confused, and was difficult to arouse. SLP was unable to perform oral motor assessment due to pt's mentation and difficulty following commands-noted missing dentition (particularly upper). Observed pt with ice chips, thin liquids via cup/straw, and Dys 1 (pureed) solids with no indications of airway compromise at bedside. Mild oral delay with initial trials of ice chips and required moderate verbal cueing to attend to POs. Due to mentation and missing dentition, recommend Dys 1 solids, thin liquids (straws ok), meds crushed. Ensure alertness while feeding, slow rate, small sips/bites. ST will f/u for treatment to assess diet tolerance  and safety/efficiency of swallow. SLP Visit Diagnosis: Dysphagia, unspecified (R13.10)    Aspiration Risk  Mild aspiration risk    Diet Recommendation Dysphagia 3 (Mech soft);Thin liquid   Liquid Administration via: Cup;Straw Medication Administration: Crushed with puree Supervision: Intermittent supervision to cue for compensatory strategies;Staff to assist with self feeding Compensations: Slow rate;Small sips/bites;Minimize environmental distractions Postural Changes: Seated upright at 90 degrees    Other  Recommendations Oral Care Recommendations: Oral care BID   Follow up Recommendations Skilled Nursing facility      Frequency and Duration min 2x/week  2 weeks       Prognosis Prognosis for Safe Diet Advancement: Good Barriers to Reach Goals: Cognitive deficits      Swallow Study   General HPI: Ptis an 83 y.o.femalewith PMH significant for dementia, anxiety, arthritis, depression, HLD, HTN, and OSA. Largely bedbound for the last 3 years. Brought to ER after patient's routine lab showed sodium of 162. In the ER patient's sodium was found to be around 156 with chloride of 120. CXR showed no active disease. Type of Study: Bedside Swallow Evaluation Previous Swallow Assessment:  (2014, hand-fed soft items once more alert) Diet Prior to this Study: NPO Temperature Spikes Noted: No Respiratory Status: Room air History of Recent Intubation: No Behavior/Cognition: Confused;Lethargic/Drowsy;Requires cueing Oral Cavity Assessment: Within Functional Limits Oral Care Completed by SLP: Yes Oral Cavity - Dentition: Poor condition;Missing dentition Vision: Functional for self-feeding Self-Feeding Abilities: Needs assist;Needs set up Patient Positioning: Upright in bed Baseline Vocal Quality: Normal Volitional Cough: Cognitively unable  to elicit Volitional Swallow: Unable to elicit    Oral/Motor/Sensory Function Overall Oral Motor/Sensory Function: Other (comment) (difficult to  assess)   Ice Chips Ice chips: Impaired Presentation: Spoon Oral Phase Functional Implications: Prolonged oral transit   Thin Liquid Thin Liquid: Within functional limits Presentation: Cup;Straw    Nectar Thick Nectar Thick Liquid: Not tested   Honey Thick Honey Thick Liquid: Not tested   Puree Puree: Within functional limits Presentation: Spoon   Solid   GO   Solid: Not tested    Functional Assessment Tool Used: skilled clinical judgement Functional Limitations: Swallowing Swallow Current Status (Z6109(G8996): At least 1 percent but less than 20 percent impaired, limited or restricted Swallow Goal Status [phone](G8997): At least 1 percent but less than 20 percent impaired, limited or restricted Swallow Discharge Status [phone](G8998): At least 1 percent but less than 20 percent impaired, limited or restricted   Occidental PetroleumMeredith Dejai Schubach , Student-SLP 08/02/2016,11:44 AM

## 2016-08-02 NOTE — Progress Notes (Signed)
CSW Intern [place] [facility] regarding long [facility] bed availability.  [facility] will have a semi private room available for patient 08/03/2016.  CSW Intern left message for patient's husband on [phone]) regarding availability at [facility].  CSW will continue to follow.    Renard HamperLecretia Kendell Gammon, CSW Intern Social Work

## 2016-08-02 NOTE — Care Management Obs Status (Signed)
MEDICARE OBSERVATION STATUS NOTIFICATION   Patient Details  Name: Brandy [name] MRN: [id number] Date of Birth: 01/14/1930   Medicare Observation Status Notification Given:  Yes    Lawerance Sabalebbie Nylan Nakatani, RN 08/02/2016, 11:03 AM

## 2016-08-02 NOTE — Progress Notes (Signed)
Initial Nutrition Assessment  DOCUMENTATION CODES:   Not applicable  INTERVENTION:  RD to order nutritional supplements as appropriate once diet advances.  NUTRITION DIAGNOSIS:   Inadequate oral intake related to inability to eat as evidenced by NPO status.  GOAL:   Patient will meet greater than or equal to 90% of their needs  MONITOR:   Diet advancement, Labs, Weight trends, Skin, I & O's  REASON FOR ASSESSMENT:   Low [place]    ASSESSMENT:   83 y.o. female with dementia largely bedbound for the last 3 years was brought to the ER after patient's routine lab showed sodium of 162.  Pt did not respond to questions asked during time of visit. No family at bedside. RD unable to obtain nutrition history. RD to order nutritional supplements once diet is advanced as appropriate. Nutrition-Focused physical exam completed. Findings are no fat depletion, moderate muscle depletion, and no edema.   Labs and medications reviewed. Sodium elevated at 154. Chloride at 118. IV fluids are running.   Diet Order:   NPO  Skin:  Reviewed, no issues  Last BM:  Unknown  Height:   Ht Readings from Last 1 Encounters:  08/01/16 5\' 8"  (1.727 m)    Weight:   Wt Readings from Last 1 Encounters:  08/01/16 124 lb 14.4 oz (56.7 kg)    Ideal Body Weight:  63.6 kg  BMI:  Body mass index is 18.99 kg/m.  Estimated Nutritional Needs:   Kcal:  1450-1650  Protein:  60-70 grams  Fluid:  >/= 1.5 L/day  EDUCATION NEEDS:   No education needs identified at this time  Roslyn SmilingStephanie Ry Moody, MS, RD, LDN Pager # [phone] After hours/ weekend pager # [phone]

## 2016-08-02 NOTE — Progress Notes (Signed)
PROGRESS NOTE [facility]   Brandy [name]   [name]:[id number] DOB: Oct 16, 1929  DOA: 08/01/2016 PCP: Katy Apo, MD   Brief Narrative:  83 year old female with history of severe dementia mainly bedbound for the past 3 years was brought to the ER after routine lab and her nursing home showed sodium of 162. In the ED patient sodium was found to be around 156, after being given fluid in the nursing home. Patient is mentally at baseline per her husband. Husband reported he wants to change nursing home, given the patient is not receiving good care at her current nursing facility.  Subjective: Patient seen and examined with husband at bedside, she has no complaint. Mentally at baseline.  Assessment & Plan: Hypernatremia - probably from dehydration and poor oral intake. Improving  Continue D5W  Repeat BMP in AM if close normal can d/c in AM pending on SNF selection   Hypertension - BP stable  Resume metoprolol  Monitor BP   History of severe dementia and depression -  Continue Depakote and Zoloft   Thrombocytopenia - No signs of bleeding  Monitor CBC    DVT prophylaxis: Lovenox Code Status: FULL  Family Communication: Husband at bedside  Disposition Plan: SNF family would like   Consultants:   None    Procedures:   None   Antimicrobials:  None    Objective: Vitals:   08/01/16 2100 08/01/16 2209 08/02/16 0527 08/02/16 0944  BP: 113/60 (!) 113/42 91/66 (!) 120/53  Pulse: (!) 57 (!) 58 72 (!) 57  Resp: 12 16 15 16   Temp:  97.4 F (36.3 C) 97.6 F (36.4 C) 97.8 F (36.6 C)  TempSrc:  Axillary Oral Oral  SpO2: 94% 99% 97% 97%  Weight:  56.7 kg (124 lb 14.4 oz)    Height:  5\' 8"  (1.727 m)      Intake/Output Summary (Last 24 hours) at 08/02/16 1548 Last data filed at 08/02/16 1447  Gross per 24 hour  Intake             1612 ml  Output                3 ml  Net             1609 ml   Filed Weights   08/01/16 2209  Weight: 56.7 kg (124 lb 14.4 oz)     Examination:  General exam: Appears calm and comfortable  HEENT: OP dry  Respiratory system: Clear to auscultation. No wheezes,crackle or rhonchi Cardiovascular system: S1 & S2 heard, RRR. +SM  No JVD, rubs or gallops Gastrointestinal system: Abdomen is nondistended, soft and nontender. Central nervous system: Oriented to person, not to time or place  Extremities: No pedal edema. Skin: No rashes, dry skin   Data Reviewed: I have personally reviewed following labs and imaging studies  CBC:  Recent Labs Lab 08/01/16 1831 08/02/16 0559  WBC 9.4 6.7  NEUTROABS 5.3  --   HGB 15.3* 13.9  HCT 48.5* 43.2  MCV 94.9 94.9  PLT 148* 115*   Basic Metabolic Panel:  Recent Labs Lab 08/01/16 1831 08/02/16 0559  NA 156* 154*  K 4.2 3.5  CL 120* 118*  CO2 29 28  GLUCOSE 96 121*  BUN 30* 27*  CREATININE 0.72 0.62  CALCIUM 9.3 8.8*  MG 2.5*  --    GFR: Estimated Creatinine Clearance: 46.9 mL/min (by C-G formula based on SCr of 0.62 mg/dL). Liver Function Tests: No results for input(s): AST, ALT,  ALKPHOS, BILITOT, PROT, ALBUMIN in the last 168 hours. No results for input(s): LIPASE, AMYLASE in the last 168 hours.  Recent Labs Lab 08/01/16 2208  AMMONIA 26   Coagulation Profile: No results for input(s): INR, PROTIME in the last 168 hours. Cardiac Enzymes: No results for input(s): CKTOTAL, CKMB, CKMBINDEX, TROPONINI in the last 168 hours. BNP (last 3 results) No results for input(s): PROBNP in the last 8760 hours. HbA1C: No results for input(s): HGBA1C in the last 72 hours. CBG:  Recent Labs Lab 08/02/16 0021 08/02/16 0821 08/02/16 1219  GLUCAP 120* 119* 90   Lipid Profile: No results for input(s): CHOL, HDL, LDLCALC, TRIG, CHOLHDL, LDLDIRECT in the last 72 hours. Thyroid Function Tests:  Recent Labs  08/01/16 2208  TSH 2.018   Anemia Panel: No results for input(s): VITAMINB12, FOLATE, FERRITIN, TIBC, IRON, RETICCTPCT in the last 72 hours. Sepsis  Labs: No results for input(s): PROCALCITON, LATICACIDVEN in the last 168 hours.  Recent Results (from the past 240 hour(s))  MRSA PCR Screening     Status: None   Collection Time: 08/02/16  8:41 AM  Result Value Ref Range Status   MRSA by PCR NEGATIVE NEGATIVE Final    Comment:        The GeneXpert MRSA Assay (FDA approved for NASAL specimens only), is one component of a comprehensive MRSA colonization surveillance program. It is not intended to diagnose MRSA infection nor to guide or monitor treatment for MRSA infections.      Radiology Studies: Dg Chest Port 1 View  Result Date: 08/01/2016 CLINICAL DATA:  Hypernatremia and irregular heart rate EXAM: PORTABLE CHEST 1 VIEW COMPARISON:  Chest radiograph 08/18/2012 FINDINGS: Cardiomediastinal contours are unchanged. No pulmonary edema or airspace consolidation. No pneumothorax or pleural effusion. IMPRESSION: No active disease. Electronically Signed   By: Deatra RobinsonKevin  Herman M.D.   On: 08/01/2016 18:57     Scheduled Meds: . enoxaparin (LOVENOX) injection  40 mg Subcutaneous QHS  . levothyroxine  25 mcg Intravenous QAC breakfast  . metoprolol  2.5 mg Intravenous Q12H  . valproate sodium  250 mg Intravenous Q12H   Continuous Infusions: . dextrose 100 mL/hr at 08/01/16 2058     LOS: 0 days    Latrelle DodrillEdwin Silva, MD Pager: Text Page via [email or web address]  [phone]  If 7PM-7AM, please contact night-coverage [email or web address] Password [email or web address] 08/02/2016, 3:48 PM

## 2016-08-03 DIAGNOSIS — F0280 Dementia in other diseases classified elsewhere without behavioral disturbance: Secondary | ICD-10-CM

## 2016-08-03 DIAGNOSIS — E039 Hypothyroidism, unspecified: Secondary | ICD-10-CM

## 2016-08-03 DIAGNOSIS — I10 Essential (primary) hypertension: Secondary | ICD-10-CM

## 2016-08-03 DIAGNOSIS — E870 Hyperosmolality and hypernatremia: Principal | ICD-10-CM

## 2016-08-03 DIAGNOSIS — G3183 Dementia with Lewy bodies: Secondary | ICD-10-CM

## 2016-08-03 LAB — BASIC METABOLIC PANEL
Anion gap: 3 — ABNORMAL LOW (ref 5–15)
BUN: 20 mg/dL (ref 6–20)
CHLORIDE: 111 mmol/L (ref 101–111)
CO2: 29 mmol/L (ref 22–32)
CREATININE: 0.59 mg/dL (ref 0.44–1.00)
Calcium: 8.4 mg/dL — ABNORMAL LOW (ref 8.9–10.3)
GFR calc non Af Amer: >60 mL/min (ref >60)
GLUCOSE: 98 mg/dL (ref 65–99)
Potassium: 3.7 mmol/L (ref 3.5–5.1)
Sodium: 143 mmol/L (ref 135–145)

## 2016-08-03 LAB — GLUCOSE, CAPILLARY
GLUCOSE-CAPILLARY: 104 mg/dL — AB (ref 65–99)
Glucose-Capillary: 93 mg/dL (ref 65–99)

## 2016-08-03 NOTE — Progress Notes (Signed)
Report called to Nurse at [facility]. Patient is packed up and ready for transport.

## 2016-08-03 NOTE — Clinical Social Work Note (Addendum)
Patient medically stable for discharge, and will discharge to [facility]. [facility] and family has come to complete admissions paperwork. Discharge clinicals will be transmitted to facility when completed and nurse will call report to [facility], phone number [phone]. Ms. Ernest Mallicksley will be transported by ambulance.  Genelle BalVanessa Jarl Sellitto, MSW, LCSW Licensed Clinical Social Worker Clinical Social Work Department [facility] [phone]

## 2016-08-03 NOTE — NC FL2 (Signed)
[place] MEDICAID FL2 LEVEL OF CARE SCREENING TOOL     IDENTIFICATION  Patient Name: Brandy [name] Birthdate: 25-Dec-1929 Sex: female Admission Date (Current Location): 08/01/2016  [place] and [place] Number:  Haynes Bast [id number] K Facility and Address:  [facility] [place] [address] [place], [place] [address]      Provider Number: [id number]  Attending Physician Name and Address:  Kathlen Mody, MD  Relative Name and Phone Number:  Tally, [place] - spouse - [phone]    Current Level of Care: Hospital Recommended Level of Care: Skilled Nursing Facility Prior Approval Number:    Date Approved/Denied:   PASRR Number: [id number]  Discharge Plan: SNF    Current Diagnoses: Patient Active Problem List   Diagnosis Date Noted  . Lewy body dementia without behavioral disturbance   . Hypernatremia 08/01/2016  . Pneumonia, organism unspecified(486) 07/13/2013  . Unspecified urinary incontinence 02/16/2013  . Psychosis 12/24/2012  . Hypothyroidism 10/18/2012  . Senile dementia, uncomplicated 10/18/2012  . Essential hypertension, benign 10/18/2012  . Dysphagia, unspecified(787.20) 08/15/2012  . Adult failure to thrive 08/15/2012  . Unspecified constipation 08/15/2012  . Encephalopathy, metabolic 08/14/2012  . UTI (lower urinary tract infection) 08/13/2012  . Urge incontinence 12/27/2011  . Leg swelling 12/27/2011  . Ganglion cyst 06/25/2011  . Chest congestion 06/25/2011  . Thoracic back pain 04/26/2011  . GERD 11/16/2009  . OBSTRUCTIVE SLEEP APNEA 04/17/2009  . Dementia 04/08/2009  . ANXIETY DEPRESSION 09/26/2008  . HYPERLIPIDEMIA 03/26/2008  . COMMON MIGRAINE 03/26/2008  . Essential hypertension 03/26/2008  . ALLERGIC RHINITIS 03/26/2008    Orientation RESPIRATION BLADDER Height & Weight     Self  Normal Incontinent Weight: 125 lb 7.1 oz (56.9 kg) Height:  5\' 8"  (172.7 cm)  BEHAVIORAL SYMPTOMS/MOOD NEUROLOGICAL BOWEL NUTRITION STATUS   Physically abusive (agitated, can be aggressive physically)   Continent Diet (DYS 3)  AMBULATORY STATUS COMMUNICATION OF NEEDS Skin   Extensive Assist Verbally Normal                       Personal Care Assistance Level of Assistance  Bathing, Feeding, Dressing Bathing Assistance: Maximum assistance Feeding assistance: Maximum assistance Dressing Assistance: Maximum assistance     Functional Limitations Info  Sight, Hearing, Speech Sight Info: Adequate Hearing Info: Adequate Speech Info: Impaired    SPECIAL CARE FACTORS FREQUENCY  Speech therapy             Speech Therapy Frequency: Evaluated 08/02/16      Contractures Contractures Info: Not present    Additional Factors Info  Code Status, Allergies Code Status Info: DNR Allergies Info: Sulfonamide Derivatives           Current Medications (08/03/2016):  This is the current hospital active medication list Current Facility-Administered Medications  Medication Dose Route Frequency Provider Last Rate Last Dose  . acetaminophen (TYLENOL) tablet 650 mg  650 mg Oral Q6H PRN Eduard Clos, MD       Or  . acetaminophen (TYLENOL) suppository 650 mg  650 mg Rectal Q6H PRN Eduard Clos, MD      . dextrose 5 % solution   Intravenous Continuous Lenox Ponds, MD 75 mL/hr at 08/02/16 1702    . divalproex (DEPAKOTE SPRINKLE) capsule 375 mg  375 mg Oral Q12H Lenox Ponds, MD   375 mg at 08/03/16 0847  . enoxaparin (LOVENOX) injection 40 mg  40 mg Subcutaneous QHS Eduard Clos, MD   40 mg at 08/02/16  2300  . levothyroxine (SYNTHROID, LEVOTHROID) tablet 50 mcg  50 mcg Oral QAC breakfast Lenox PondsEdwin Silva Zapata, MD   50 mcg at 08/03/16 [phone]  . metoprolol tartrate (LOPRESSOR) tablet 12.5 mg  12.5 mg Oral Daily Lenox PondsEdwin Silva Zapata, MD   12.5 mg at 08/03/16 0847  . sertraline (ZOLOFT) tablet 100 mg  100 mg Oral Daily Lenox PondsEdwin Silva Zapata, MD   100 mg at 08/03/16 [id number]     Discharge Medications: Please see  discharge summary for a list of discharge medications.  Relevant Imaging Results:  Relevant Lab Results:   Additional Information ss#[id number]  Okey DupreCrawford, Lazaro ArmsVanessa Bradley, LCSW

## 2016-08-03 NOTE — Discharge Summary (Signed)
Physician Discharge Summary  Brandy [name]:[id number] DOB: 1929-07-31 DOA: 08/01/2016  PCP: Katy Apo, MD  Admit date: 08/01/2016 Discharge date: 08/03/2016  Admitted From: SNF Disposition:  SNF  Recommendations for Outpatient Follow-up:  1. Follow up with PCP in 1-2 weeks 2. Please obtain BMP/CBC in one week     Discharge Condition:stable.  CODE STATUS:DNR Diet recommendation: Dysphagia 3 WITH THIN LIQUIDS.   Brief/Interim Summary: 83 year old female with history of severe dementia mainly bedbound for the past 3 years was brought to the ER after routine lab and her nursing home showed sodium of 162. In the ED patient sodium was found to be around 156, after being given fluid in the nursing home. Patient is mentally at baseline per her husband. Husband reported he wants to change nursing home, given the patient is not receiving good care at her current nursing facility. HER sodium improved and she is at baseline.    Discharge Diagnoses:  Principal Problem:   Hypernatremia Active Problems:   Essential hypertension   Hypothyroidism   Lewy body dementia without behavioral disturbance  Hypernatremia- probably from dehydration and poor oral intake. Improving with dextrose fluids. Encourage with diet and fluids.   Hypertension - BP stable  Resume metoprolol  Monitor BP   History of severe dementia and depression -  Continue Depakote and Zoloft   Thrombocytopenia- No signs of bleeding  Monitor CBC    Discharge Instructions  Discharge Instructions    Discharge instructions    Complete by:  As directed    Follow up with PCP in one week.     Allergies as of 08/03/2016      Reactions   Other Other (See Comments)   CRANBERRY JUICE   Sulfonamide Derivatives Other (See Comments)   REACTION: Can't remember      Medication List    STOP taking these medications   ciprofloxacin 250 MG tablet Commonly known as:  CIPRO     TAKE these medications   CALCIUM  600+D 600-400 MG-UNIT tablet Generic drug:  Calcium Carbonate-Vitamin D Take 2 tablets by mouth daily. For bones   divalproex 125 MG capsule Commonly known as:  DEPAKOTE SPRINKLE Take 2 capsules (250 mg total) by mouth every 12 (twelve) hours. What changed:  how much to take  when to take this   levothyroxine 50 MCG tablet Commonly known as:  SYNTHROID, LEVOTHROID Take 50 mcg by mouth daily before breakfast.   metoprolol tartrate 25 MG tablet Commonly known as:  LOPRESSOR Take 12.5 mg by mouth daily.   multivitamin tablet Take 1 tablet by mouth daily.   multivitamin-lutein Caps capsule Take 1 capsule by mouth daily.   nitrofurantoin (macrocrystal-monohydrate) 100 MG capsule Commonly known as:  MACROBID Take 100 mg by mouth 2 (two) times daily.   omeprazole 40 MG capsule Commonly known as:  PRILOSEC Take 1 capsule (40 mg total) by mouth daily. For reflux   senna-docusate 8.6-50 MG tablet Commonly known as:  Senokot-S Take 1 tablet by mouth at bedtime.   sertraline 100 MG tablet Commonly known as:  ZOLOFT Take 100 mg by mouth daily.      Follow-up Information    POLITE,RONALD D, MD. Schedule an appointment as soon as possible for a visit in 1 week(s).   Specialty:  Internal Medicine Contact information: [address] [place] [address] [phone]          Allergies  Allergen Reactions  . Other Other (See Comments)  CRANBERRY JUICE  . Sulfonamide Derivatives Other (See Comments)    REACTION: Can't remember    Consultations: None.   Procedures/Studies: Dg Chest Port 1 View  Result Date: 08/01/2016 CLINICAL DATA:  Hypernatremia and irregular heart rate EXAM: PORTABLE CHEST 1 VIEW COMPARISON:  Chest radiograph 08/18/2012 FINDINGS: Cardiomediastinal contours are unchanged. No pulmonary edema or airspace consolidation. No pneumothorax or pleural effusion. IMPRESSION: No active disease. Electronically Signed   By: Deatra Robinson M.D.    On: 08/01/2016 18:57       Subjective: No new complaints   Discharge Exam: Vitals:   08/03/16 0453 08/03/16 0831  BP: 116/63 (!) 109/53  Pulse: (!) 59 69  Resp: 19 16  Temp: 98.2 F (36.8 C) 97.7 F (36.5 C)   Vitals:   08/02/16 1720 08/02/16 2140 08/03/16 0453 08/03/16 0831  BP: (!) 87/44 (!) 98/52 116/63 (!) 109/53  Pulse: 71 68 (!) 59 69  Resp: 14 16 19 16   Temp: 97.5 F (36.4 C) 98.4 F (36.9 C) 98.2 F (36.8 C) 97.7 F (36.5 C)  TempSrc: Oral Oral Oral Oral  SpO2: 95% 97% 98% 98%  Weight:  56.9 kg (125 lb 7.1 oz)    Height:        General: Pt is alert, awake, not in acute distress Cardiovascular: RRR, S1/S2 +, no rubs, no gallops Respiratory: CTA bilaterally, no wheezing, no rhonchi Abdominal: Soft, NT, ND, bowel sounds + Extremities: no edema, no cyanosis    The results of significant diagnostics from this hospitalization (including imaging, microbiology, ancillary and laboratory) are listed below for reference.     Microbiology: Recent Results (from the past 240 hour(s))  MRSA PCR Screening     Status: None   Collection Time: 08/02/16  8:41 AM  Result Value Ref Range Status   MRSA by PCR NEGATIVE NEGATIVE Final    Comment:        The GeneXpert MRSA Assay (FDA approved for NASAL specimens only), is one component of a comprehensive MRSA colonization surveillance program. It is not intended to diagnose MRSA infection nor to guide or monitor treatment for MRSA infections.      Labs: BNP (last 3 results) No results for input(s): BNP in the last 8760 hours. Basic Metabolic Panel:  Recent Labs Lab 08/01/16 1831 08/02/16 0559 08/03/16 0448  NA 156* 154* 143  K 4.2 3.5 3.7  CL 120* 118* 111  CO2 29 28 29   GLUCOSE 96 121* 98  BUN 30* 27* 20  CREATININE 0.72 0.62 0.59  CALCIUM 9.3 8.8* 8.4*  MG 2.5*  --   --    Liver Function Tests: No results for input(s): AST, ALT, ALKPHOS, BILITOT, PROT, ALBUMIN in the last 168 hours. No results  for input(s): LIPASE, AMYLASE in the last 168 hours.  Recent Labs Lab 08/01/16 2208  AMMONIA 26   CBC:  Recent Labs Lab 08/01/16 1831 08/02/16 0559  WBC 9.4 6.7  NEUTROABS 5.3  --   HGB 15.3* 13.9  HCT 48.5* 43.2  MCV 94.9 94.9  PLT 148* 115*   Cardiac Enzymes: No results for input(s): CKTOTAL, CKMB, CKMBINDEX, TROPONINI in the last 168 hours. BNP: Invalid input(s): POCBNP CBG:  Recent Labs Lab 08/02/16 0821 08/02/16 1219 08/02/16 1701 08/02/16 2347 08/03/16 0751  GLUCAP 119* 90 106* 95 93   D-Dimer No results for input(s): DDIMER in the last 72 hours. Hgb A1c No results for input(s): HGBA1C in the last 72 hours. Lipid Profile No results for input(s): CHOL,  HDL, LDLCALC, TRIG, CHOLHDL, LDLDIRECT in the last 72 hours. Thyroid function studies  Recent Labs  08/01/16 2208  TSH 2.018   Anemia work up No results for input(s): VITAMINB12, FOLATE, FERRITIN, TIBC, IRON, RETICCTPCT in the last 72 hours. Urinalysis    Component Value Date/Time   COLORURINE AMBER (A) 08/13/2012 1225   APPEARANCEUR CLOUDY (A) 08/13/2012 1225   LABSPEC 1.020 08/13/2012 1225   PHURINE 6.0 08/13/2012 1225   GLUCOSEU NEGATIVE 08/13/2012 1225   HGBUR MODERATE (A) 08/13/2012 1225   HGBUR negative 04/20/2010 0755   BILIRUBINUR NEGATIVE 08/13/2012 1225   BILIRUBINUR neg 12/26/2011 1627   KETONESUR NEGATIVE 08/13/2012 1225   PROTEINUR 100 (A) 08/13/2012 1225   UROBILINOGEN 2.0 (H) 08/13/2012 1225   NITRITE NEGATIVE 08/13/2012 1225   LEUKOCYTESUR MODERATE (A) 08/13/2012 1225   Sepsis Labs Invalid input(s): PROCALCITONIN,  WBC,  LACTICIDVEN Microbiology Recent Results (from the past 240 hour(s))  MRSA PCR Screening     Status: None   Collection Time: 08/02/16  8:41 AM  Result Value Ref Range Status   MRSA by PCR NEGATIVE NEGATIVE Final    Comment:        The GeneXpert MRSA Assay (FDA approved for NASAL specimens only), is one component of a comprehensive MRSA  colonization surveillance program. It is not intended to diagnose MRSA infection nor to guide or monitor treatment for MRSA infections.      Time coordinating discharge: Over 30 minutes  SIGNED:   Kathlen ModyAKULA,Crist Kruszka, MD  [facility] 08/03/2016, 12:11 PM Pager   If 7PM-7AM, please contact night-coverage [email or web address] Password [email or web address]

## 2016-08-03 NOTE — Progress Notes (Signed)
[name] per MD order.  Discussed prescriptions and follow up appointments with the patient. Prescriptions given to patient, medication list explained in detail. Pt verbalized understanding.  Allergies as of 08/03/2016      Reactions   Other Other (See Comments)   CRANBERRY JUICE   Sulfonamide Derivatives Other (See Comments)   REACTION: Can't remember      Medication List    STOP taking these medications   ciprofloxacin 250 MG tablet Commonly known as:  CIPRO     TAKE these medications   CALCIUM 600+D 600-400 MG-UNIT tablet Generic drug:  Calcium Carbonate-Vitamin D Take 2 tablets by mouth daily. For bones   divalproex 125 MG capsule Commonly known as:  DEPAKOTE SPRINKLE Take 2 capsules (250 mg total) by mouth every 12 (twelve) hours. What changed:  how much to take  when to take this   levothyroxine 50 MCG tablet Commonly known as:  SYNTHROID, LEVOTHROID Take 50 mcg by mouth daily before breakfast.   metoprolol tartrate 25 MG tablet Commonly known as:  LOPRESSOR Take 12.5 mg by mouth daily.   multivitamin tablet Take 1 tablet by mouth daily.   multivitamin-lutein Caps capsule Take 1 capsule by mouth daily.   nitrofurantoin (macrocrystal-monohydrate) 100 MG capsule Commonly known as:  MACROBID Take 100 mg by mouth 2 (two) times daily.   omeprazole 40 MG capsule Commonly known as:  PRILOSEC Take 1 capsule (40 mg total) by mouth daily. For reflux   senna-docusate 8.6-50 MG tablet Commonly known as:  Senokot-S Take 1 tablet by mouth at bedtime.   sertraline 100 MG tablet Commonly known as:  ZOLOFT Take 100 mg by mouth daily.       Vitals:   08/03/16 0453 08/03/16 0831  BP: 116/63 (!) 109/53  Pulse: (!) 59 69  Resp: 19 16  Temp: 98.2 F (36.8 C) 97.7 F (36.5 C)    Skin clean, dry and intact without evidence of skin break down, no evidence of skin tears noted. IV catheter discontinued intact. Site  without signs and symptoms of complications. Dressing and pressure applied. Pt denies pain at this time. No complaints noted.  An After Visit Summary was printed and given to the patient. Patient escorted via WC, and D/C home via private auto.  Mariann BarterKellie Ginamarie Banfield BSN, RN [facility] Phone [phone]

## 2016-08-05 ENCOUNTER — Encounter: Payer: Self-pay | Admitting: Internal Medicine

## 2016-08-05 ENCOUNTER — Non-Acute Institutional Stay (SKILLED_NURSING_FACILITY): Payer: Medicare Other | Admitting: Internal Medicine

## 2016-08-05 DIAGNOSIS — I10 Essential (primary) hypertension: Secondary | ICD-10-CM | POA: Diagnosis not present

## 2016-08-05 DIAGNOSIS — E039 Hypothyroidism, unspecified: Secondary | ICD-10-CM

## 2016-08-05 DIAGNOSIS — E870 Hyperosmolality and hypernatremia: Secondary | ICD-10-CM | POA: Diagnosis not present

## 2016-08-05 DIAGNOSIS — K219 Gastro-esophageal reflux disease without esophagitis: Secondary | ICD-10-CM

## 2016-08-05 DIAGNOSIS — S300XXA Contusion of lower back and pelvis, initial encounter: Secondary | ICD-10-CM

## 2016-08-05 DIAGNOSIS — R5381 Other malaise: Secondary | ICD-10-CM | POA: Diagnosis not present

## 2016-08-05 DIAGNOSIS — E440 Moderate protein-calorie malnutrition: Secondary | ICD-10-CM

## 2016-08-05 DIAGNOSIS — G3183 Dementia with Lewy bodies: Secondary | ICD-10-CM | POA: Diagnosis not present

## 2016-08-05 DIAGNOSIS — R1312 Dysphagia, oropharyngeal phase: Secondary | ICD-10-CM | POA: Diagnosis not present

## 2016-08-05 DIAGNOSIS — F0281 Dementia in other diseases classified elsewhere with behavioral disturbance: Secondary | ICD-10-CM

## 2016-08-05 DIAGNOSIS — F02818 Dementia in other diseases classified elsewhere, unspecified severity, with other behavioral disturbance: Secondary | ICD-10-CM

## 2016-08-05 DIAGNOSIS — D696 Thrombocytopenia, unspecified: Secondary | ICD-10-CM

## 2016-08-05 DIAGNOSIS — R6889 Other general symptoms and signs: Secondary | ICD-10-CM

## 2016-08-05 NOTE — Progress Notes (Signed)
[facility]  PCP: Katy ApoPOLITE,RONALD D, MD   Code Status: DNR  Goals of care: Advanced Directive information Advanced Directives 08/05/2016  Does Patient Have a Medical Advance Directive? Yes  Type of Advance Directive Out of facility DNR (pink MOST or yellow form)  Does patient want to make changes to medical advance directive? No - Patient declined  Copy of Healthcare Power of Attorney in Chart? -  Would patient like information on creating a medical advance directive? -       Extended Emergency Contact Information Primary Emergency Contact: Taitt,Kenneth Address: [phone]          [place], [place] Home Phone: [phone] Work Phone: [phone] Relation: Other Secondary Emergency Contact: Sarajane MarekBrewer,Douglas  [place] Home Phone: [phone] Mobile Phone: [phone] Relation: Son   Allergies  Allergen Reactions  . Other Other (See Comments)    CRANBERRY JUICE  . Sulfonamide Derivatives Other (See Comments)    REACTION: Can't remember    Chief Complaint  Patient presents with  . New Admit To SNF    New Admission Visit      HPI:  Patient is a 83 y.o. female seen today for short term rehabilitation post hospital admission from 26th of February 2018-28th of February 2018 with change in mental status. She was found to have hypernatremia with sodium of 162. This was likely from dehydration and poor oral intake. She received IV fluids and her mental status improved. She has been residing in nursing home prior to this hospital admission.She has medical history of severe dementia, hypertension, hypothyroidism among others and is under total care. She is seen in her room today. She is sitting on a wheelchair. She does not participate in history taking and review of systems. Per nursing staff, she needs assistance with feeding. She takes her medications crushed. She follows some basic commands at times. She is on dysphagia diet and  aspiration precautions are being taken. She has bowel and bladder incontinence. No fall has been reported in the facility.  Review of Systems: Unable to obtain    Past Medical History:  Diagnosis Date  . Allergic rhinitis   . Anxiety   . Arthritis   . Dementia    neuro eval WNL, normal MRI, eval at behavioral health center after admission with dx mild-mod dementia  . Depression   . Herpes zoster 12/29/2006  . HLD (hyperlipidemia)   . HTN (hypertension)   . Migraine   . OSA (obstructive sleep apnea)    intermittent CPAP use  . Urinary incontinence    Past Surgical History:  Procedure Laterality Date  . ABDOMINAL HYSTERECTOMY     TAH for fibroids  . APPENDECTOMY    . NECK SURGERY     CALCIUM DEPOSITS   Social History:   reports that she quit smoking about 47 years ago. Her smoking use included Cigarettes. She has a 16.00 pack-year smoking history. She has never used smokeless tobacco. She reports that she does not drink alcohol or use drugs.  Family History  Problem Relation Age of Onset  . Heart attack Father     in 5560's  . Alcohol abuse Father   . Lymphoma Mother   . Cancer Mother     LYMPHOMA  . Coronary artery disease Maternal Grandmother   . Breast cancer Maternal Grandmother   . Sudden death Maternal Grandfather     Medications: Allergies as of 08/05/2016      Reactions   Other Other (  See Comments)   CRANBERRY JUICE   Sulfonamide Derivatives Other (See Comments)   REACTION: Can't remember      Medication List       Accurate as of 08/05/16  3:54 PM. Always use your most recent med list.          CALCIUM 600+D 600-400 MG-UNIT tablet Generic drug:  Calcium Carbonate-Vitamin D Take 2 tablets by mouth daily. For bones   divalproex 125 MG capsule Commonly known as:  DEPAKOTE SPRINKLE Take 2 capsules (250 mg total) by mouth every 12 (twelve) hours.   levothyroxine 50 MCG tablet Commonly known as:  SYNTHROID, LEVOTHROID Take 50 mcg by mouth daily  before breakfast.   metoprolol tartrate 25 MG tablet Commonly known as:  LOPRESSOR Take 12.5 mg by mouth daily.   multivitamin tablet Take 1 tablet by mouth daily.   multivitamin-lutein Caps capsule Take 1 capsule by mouth daily.   omeprazole 40 MG capsule Commonly known as:  PRILOSEC Take 1 capsule (40 mg total) by mouth daily. For reflux   senna-docusate 8.6-50 MG tablet Commonly known as:  Senokot-S Take 1 tablet by mouth at bedtime.   sertraline 100 MG tablet Commonly known as:  ZOLOFT Take 100 mg by mouth daily.       Immunizations: Immunization History  Administered Date(s) Administered  . Influenza Whole 03/06/2008  . Influenza-Unspecified 03/06/2014  . Pneumococcal Polysaccharide-23 06/06/1998  . Td 06/06/2002  . Tdap 10/27/2015     Physical Exam:  Vitals:   08/05/16 1550  BP: 115/65  Pulse: 67  Resp: 18  Temp: 98 F (36.7 C)  TempSrc: Oral  SpO2: 92%  Weight: 125 lb 6.4 oz (56.9 kg)  Height: 5\' 8"  (1.727 m)   Body mass index is 19.07 kg/m.  General- elderly female, frail and thin built, in no acute distress Head- normocephalic, atraumatic Nose- no nasal discharge Throat- moist mucus membrane Eyes- no pallor, no icterus, no discharge, normal conjunctiva, normal sclera Neck- no cervical lymphadenopathy Cardiovascular- normal s1,s2, no murmur Respiratory- bilateral clear to auscultation, no wheeze, no rhonchi, no crackles, no use of accessory muscles Abdomen- bowel sounds present, soft, non tender, no guarding or rigidity Musculoskeletal- on wheelchair, under total care with her activities of daily living, limited range of motion with her extremities Neurological- alert and oriented to self  Skin- warm and dry, suspected deep tissue injury to sacrum Psychiatry- normal mood and affect    Labs reviewed: Basic Metabolic Panel:  Recent Labs  16/10/96 1831 08/02/16 0559 08/03/16 0448  NA 156* 154* 143  K 4.2 3.5 3.7  CL 120* 118* 111    CO2 29 28 29   GLUCOSE 96 121* 98  BUN 30* 27* 20  CREATININE 0.72 0.62 0.59  CALCIUM 9.3 8.8* 8.4*  MG 2.5*  --   --    Liver Function Tests: No results for input(s): AST, ALT, ALKPHOS, BILITOT, PROT, ALBUMIN in the last 8760 hours. No results for input(s): LIPASE, AMYLASE in the last 8760 hours.  Recent Labs  08/01/16 2208  AMMONIA 26   CBC:  Recent Labs  08/01/16 1831 08/02/16 0559  WBC 9.4 6.7  NEUTROABS 5.3  --   HGB 15.3* 13.9  HCT 48.5* 43.2  MCV 94.9 94.9  PLT 148* 115*   Cardiac Enzymes: No results for input(s): CKTOTAL, CKMB, CKMBINDEX, TROPONINI in the last 8760 hours. BNP: Invalid input(s): POCBNP CBG:  Recent Labs  08/02/16 2347 08/03/16 0751 08/03/16 1226  GLUCAP 95 93 104*  Radiological Exams: Dg Chest Port 1 View  Result Date: 08/01/2016 CLINICAL DATA:  Hypernatremia and irregular heart rate EXAM: PORTABLE CHEST 1 VIEW COMPARISON:  Chest radiograph 08/18/2012 FINDINGS: Cardiomediastinal contours are unchanged. No pulmonary edema or airspace consolidation. No pneumothorax or pleural effusion. IMPRESSION: No active disease. Electronically Signed   By: Deatra Robinson M.D.   On: 08/01/2016 18:57    Assessment/Plan  Physical deconditioning Given her recent hospitalization and medical comorbidities. Will need for patient to work with physical therapy and occupational therapy as tolerated to help regain her strength. Fall precautions to be taken.  Hypernatremia Status post IV fluids in the hospital. Monitor BMP. Encourage hydration every shift by nursing  Protein calorie malnutrition Monitor oral intake and to provide assistance with feeding. Registered dietitian to follow. Continue her multivitamin, calcium and vitamin D supplement.  Dysphagia Continue mechanical soft diet for now. SLP follow-up.  Dementia with behavioral disturbance Get psychiatry consult for follow-up. Provide supportive care. Continue Depakote sprinkles and  sertraline.  Suspected deep tissue injury To sacrum. Clean area with normal saline, apply skin prep topically and allow it to dry and cover with bordered foam dressing every 4 days for now and monitor  Hypothyroidism Continue Synthroid current regimen and check TSH  GERD Continue omeprazole 40 mg in the morning and monitor clinically  Thrombocytopenia No bleed reported. Monitor platelet count.  Hypertension Monitor blood pressure reading. On metoprolol tartrate 12.5 mg daily. Check BMP   Goals of care: short term rehabilitationFollowed by long-term care   Labs/tests ordered: CBC, CMP, TSH 08/08/2016  Family/ staff Communication: reviewed care plan with patient's charge nurse and nursing supervisor    Oneal Grout, MD Internal Medicine [facility] [address] [place], [place] [address] Cell Phone (Monday-Friday 8 am - 5 pm): [phone] On Call: [phone] and follow prompts after 5 pm and on weekends Office Phone: [phone] Office Fax: [phone]

## 2016-08-08 LAB — BASIC METABOLIC PANEL
BUN: 12 mg/dL (ref 4–21)
CREATININE: 0.5 mg/dL (ref 0.5–1.1)
Glucose: 83 mg/dL
Potassium: 3.9 mmol/L (ref 3.4–5.3)
SODIUM: 144 mmol/L (ref 137–147)

## 2016-08-08 LAB — CBC AND DIFFERENTIAL
HEMATOCRIT: 39 % (ref 36–46)
Hemoglobin: 12.7 g/dL (ref 12.0–16.0)
PLATELETS: 151 10*3/uL (ref 150–399)
WBC: 5.8 10^3/mL

## 2016-08-08 LAB — HEPATIC FUNCTION PANEL
ALK PHOS: 60 U/L (ref 25–125)
ALT: 9 U/L (ref 7–35)
AST: 15 U/L (ref 13–35)
BILIRUBIN, TOTAL: 0.4 mg/dL

## 2016-08-08 LAB — TSH: TSH: 1.58 u[IU]/mL (ref 0.41–5.90)

## 2016-08-09 ENCOUNTER — Non-Acute Institutional Stay (SKILLED_NURSING_FACILITY): Payer: Medicare Other | Admitting: Family

## 2016-08-09 DIAGNOSIS — E440 Moderate protein-calorie malnutrition: Secondary | ICD-10-CM

## 2016-08-09 DIAGNOSIS — E8809 Other disorders of plasma-protein metabolism, not elsewhere classified: Secondary | ICD-10-CM

## 2016-08-09 NOTE — Progress Notes (Signed)
Location:  [facility] Room Number: 202 A  Place of Service:  SNF (31) Provider:  Zymeir Salminen FNP-C   Katy Apo, MD  Patient Care Team: Renford Dills, MD as PCP - General (Internal Medicine)  Extended Emergency Contact Information Primary Emergency Contact: Denley,Kenneth Address: [phone]          [place], [place] Home Phone: [phone] Work Phone: [phone] Relation: Other Secondary Emergency Contact: Sarajane Marek [place] of [place] Home Phone: [phone] Mobile Phone: [phone] Relation: Son  Code Status:  Full Code  Goals of care: Advanced Directive information Advanced Directives 08/05/2016  Does Patient Have a Medical Advance Directive? Yes  Type of Advance Directive Out of facility DNR (pink MOST or yellow form)  Does patient want to make changes to medical advance directive? No - Patient declined  Copy of Healthcare Power of Attorney in Chart? -  Would patient like information on creating a medical advance directive? -     Chief Complaint  Patient presents with  . Acute Visit    abnormal labs     HPI:  Pt is a 83 y.o. female seen today at [facility]  for an acute visit for evaluation of abnormal lab results. She has a medical history of HTN,Hyperlipdidemia, Generalized anxiety, OSA, Hypothyroidism,Dementia among other conditions. She is seen in her room today. She is pleasantly confused unable to provide HPI and ROS. Facility Nurse reports no new concerns. Her recent lab results showed TP 4.5, Alb 2.44 ( 08/08/2016). She was seen by Facility Registered dietician prior to visit Prostat supplement  and Decubi-vite ordered.    Past Medical History:  Diagnosis Date  . Allergic rhinitis   . Anxiety   . Arthritis   . Dementia    neuro eval WNL, normal MRI, [facility] after admission with dx mild-mod dementia  . Depression   . Herpes zoster 12/29/2006  .  HLD (hyperlipidemia)   . HTN (hypertension)   . Migraine   . OSA (obstructive sleep apnea)    intermittent CPAP use  . Urinary incontinence    Past Surgical History:  Procedure Laterality Date  . ABDOMINAL HYSTERECTOMY     TAH for fibroids  . APPENDECTOMY    . NECK SURGERY     CALCIUM DEPOSITS    Allergies  Allergen Reactions  . Other Other (See Comments)    CRANBERRY JUICE  . Sulfonamide Derivatives Other (See Comments)    REACTION: Can't remember    Allergies as of 08/09/2016      Reactions   Other Other (See Comments)   CRANBERRY JUICE   Sulfonamide Derivatives Other (See Comments)   REACTION: Can't remember      Medication List       Accurate as of 08/09/16  4:50 PM. Always use your most recent med list.          CALCIUM 600+D 600-400 MG-UNIT tablet Generic drug:  Calcium Carbonate-Vitamin D Take 2 tablets by mouth daily. For bones   divalproex 125 MG capsule Commonly known as:  DEPAKOTE SPRINKLE Take 2 capsules (250 mg total) by mouth every 12 (twelve) hours.   levothyroxine 50 MCG tablet Commonly known as:  SYNTHROID, LEVOTHROID Take 50 mcg by mouth daily before breakfast.   metoprolol tartrate 25 MG tablet Commonly known as:  LOPRESSOR Take 12.5 mg by mouth daily.   multivitamin tablet Take 1 tablet by mouth daily.   multivitamin-lutein Caps capsule  Take 1 capsule by mouth daily.   omeprazole 40 MG capsule Commonly known as:  PRILOSEC Take 1 capsule (40 mg total) by mouth daily. For reflux   senna-docusate 8.6-50 MG tablet Commonly known as:  Senokot-S Take 1 tablet by mouth at bedtime.   sertraline 100 MG tablet Commonly known as:  ZOLOFT Take 100 mg by mouth daily.       Review of Systems  Unable to perform ROS: Dementia    Immunization History  Administered Date(s) Administered  . Influenza Whole 03/06/2008  . Influenza-Unspecified 03/06/2014  . Pneumococcal Polysaccharide-23 06/06/1998  . Td 06/06/2002  . Tdap 10/27/2015    Pertinent  Health Maintenance Due  Topic Date Due  . PNA vac Low Risk Adult (2 of 2 - PCV13) 06/07/1999  . PAP SMEAR  04/13/2012  . MAMMOGRAM  10/24/2012  . INFLUENZA VACCINE  01/05/2016  . DEXA SCAN  Completed    Vitals:   08/09/16 1145  BP: 110/72  Pulse: 80  Resp: 20  Temp: 97 F (36.1 C)  SpO2: 94%  Weight: 122 lb 9.6 oz (55.6 kg)  Height: 5\' 8"  (1.727 m)   Body mass index is 18.64 kg/m. Physical Exam  Constitutional: She appears well-developed and well-nourished. No distress.  HENT:  Head: Normocephalic.  Mouth/Throat: Oropharynx is clear and moist. No oropharyngeal exudate.  Eyes: Conjunctivae and EOM are normal. Pupils are equal, round, and reactive to light. Right eye exhibits no discharge. Left eye exhibits no discharge. No scleral icterus.  Neck: Normal range of motion. No JVD present. No thyromegaly present.  Cardiovascular: Normal rate, regular rhythm, normal heart sounds and intact distal pulses.  Exam reveals no gallop and no friction rub.   No murmur heard. Pulmonary/Chest: Effort normal and breath sounds normal. No respiratory distress. She has no wheezes. She has no rales. She exhibits no tenderness.  Abdominal: Soft. Bowel sounds are normal. She exhibits no distension. There is no tenderness. There is no rebound.  Genitourinary:  Genitourinary Comments: Incontinent   Musculoskeletal: She exhibits no edema, tenderness or deformity.  Moves x 4 extremities.   Lymphadenopathy:    She has no cervical adenopathy.  Neurological: She is alert.  Pleasantly confused   Skin: Skin is warm and dry. No rash noted. No erythema. No pallor.  Psychiatric: She has a normal mood and affect.    Labs reviewed:  Recent Labs  08/01/16 1831 08/02/16 0559 08/03/16 0448 08/08/16  NA 156* 154* 143 144  K 4.2 3.5 3.7 3.9  CL 120* 118* 111  --   CO2 29 28 29   --   GLUCOSE 96 121* 98  --   BUN 30* 27* 20 12  CREATININE 0.72 0.62 0.59 0.5  CALCIUM 9.3 8.8* 8.4*  --    MG 2.5*  --   --   --     Recent Labs  08/01/16 1831 08/02/16 0559 08/08/16  WBC 9.4 6.7 5.8  NEUTROABS 5.3  --   --   HGB 15.3* 13.9 12.7  HCT 48.5* 43.2 39  MCV 94.9 94.9  --   PLT 148* 115* 151   Lab Results  Component Value Date   TSH 1.58 08/08/2016   No results found for: HGBA1C Lab Results  Component Value Date   CHOL 204 (H) 01/05/2011   HDL 43.90 01/05/2011   LDLCALC 219 05/04/2009   LDLDIRECT 137.2 01/05/2011   TRIG 113.0 01/05/2011   CHOLHDL 5 01/05/2011    Assessment/Plan 1. Moderate protein-calorie malnutrition (  HCC) TP 4.5 ( 08/08/2016) RD consult for protein supplements in place. Recheck BMP 08/16/2016. Encourage oral intake.   2. Hypoalbuminemia Alb 2.44 ( 08/08/2016) RD to continue to monitor.   3. Unstageable Pressure ulcer sacral Afebrile. No signs of infection noted. Continue on protein supplements, MVI to promote wound healing. Continue wound care.     Family/ staff Communication: Reviewed plan of care with facility Nurse supervisor.   Labs/tests ordered:  BMP 08/16/2016

## 2016-08-16 LAB — BASIC METABOLIC PANEL
BUN: 7 mg/dL (ref 4–21)
CREATININE: 0.6 mg/dL (ref 0.5–1.1)
Glucose: 89 mg/dL
POTASSIUM: 4.3 mmol/L (ref 3.4–5.3)
Sodium: 144 mmol/L (ref 137–147)

## 2016-09-07 ENCOUNTER — Encounter: Payer: Self-pay | Admitting: Family

## 2016-09-07 ENCOUNTER — Non-Acute Institutional Stay (SKILLED_NURSING_FACILITY): Payer: Medicare Other | Admitting: Family

## 2016-09-07 DIAGNOSIS — E782 Mixed hyperlipidemia: Secondary | ICD-10-CM | POA: Diagnosis not present

## 2016-09-07 DIAGNOSIS — K219 Gastro-esophageal reflux disease without esophagitis: Secondary | ICD-10-CM | POA: Diagnosis not present

## 2016-09-07 DIAGNOSIS — F341 Dysthymic disorder: Secondary | ICD-10-CM

## 2016-09-07 DIAGNOSIS — I10 Essential (primary) hypertension: Secondary | ICD-10-CM

## 2016-09-07 DIAGNOSIS — E039 Hypothyroidism, unspecified: Secondary | ICD-10-CM

## 2016-09-07 NOTE — Progress Notes (Signed)
Location:  [facility] Room Number: 202-B Place of Service:  SNF (31) Provider:  Kristyne Woodring FNP-C   Katy Apo, MD  Patient Care Team: Renford Dills, MD as PCP - General (Internal Medicine)  Extended Emergency Contact Information Primary Emergency Contact: Brandy Brandy [name],Kenneth Address: [address]          [place], [place] Home Phone: [phone] Work Phone: [phone] Relation: Other Secondary Emergency Contact: Brandy Brandy [name] [place] of [place] Home Phone: [phone] Mobile Phone: [phone] Relation: Son  Code Status:  Full Code  Goals of care: Advanced Directive information Advanced Directives 08/05/2016  Does Patient Have a Medical Advance Directive? Yes  Type of Advance Directive Out of facility DNR (pink MOST or yellow form)  Does patient want to make changes to medical advance directive? No - Patient declined  Copy of Healthcare Power of Attorney in Chart? -  Would patient like information on creating a medical advance directive? -     Chief Complaint  Patient presents with  . Medical Management of Chronic Issues    Routine Visit , 1 month follow-up     HPI:  Pt is a 83 y.o. Brandy [name] seen today at [facility] for medical management of chronic issues.She has a medical history of HTN,Hyperlipdidemia, Generalized anxiety, OSA, Hypothyroidism,Dementia among other conditions. She is seen in her room today.She is answers yes or no questions but inappropriate answers at times. She has had no recent acute illness or fall episodes. Facility Nurse reports no new concerns. Patient's POA in to visit states no concerns.she was seen by Psychiatry service Depakote reduced to 125 mg at bedtime for mood stabilization.   Past Medical History:  Diagnosis Date  . Allergic rhinitis   . Anxiety   . Arthritis   . Dementia    neuro eval WNL, normal MRI, eval at behavioral health center after admission with dx  mild-mod dementia  . Depression   . Herpes zoster 12/29/2006  . HLD (hyperlipidemia)   . HTN (hypertension)   . Migraine   . OSA (obstructive sleep apnea)    intermittent CPAP use  . Urinary incontinence    Past Surgical History:  Procedure Laterality Date  . ABDOMINAL HYSTERECTOMY     TAH for fibroids  . APPENDECTOMY    . NECK SURGERY     CALCIUM DEPOSITS    Allergies  Allergen Reactions  . Other Other (See Comments)    CRANBERRY JUICE  . Sulfonamide Derivatives Other (See Comments)    REACTION: Can't remember    Allergies as of 09/07/2016      Reactions   Other Other (See Comments)   CRANBERRY JUICE   Sulfonamide Derivatives Other (See Comments)   REACTION: Can't remember      Medication List       Accurate as of 09/07/16  5:10 PM. Always use your most recent med list.          CALCIUM 600+D 600-400 MG-UNIT tablet Generic drug:  Calcium Carbonate-Vitamin D Take 2 tablets by mouth daily. For bones   divalproex 125 MG capsule Commonly known as:  DEPAKOTE SPRINKLE Take 125 mg by mouth at bedtime.   levothyroxine 50 MCG tablet Commonly known as:  SYNTHROID, LEVOTHROID Take 50 mcg by mouth daily before breakfast.   metoprolol tartrate 25 MG tablet Commonly known as:  LOPRESSOR Take 12.5 mg by mouth daily.   multivitamin tablet Take 1 tablet by mouth daily.   multivitamin-lutein Caps capsule  Take 1 capsule by mouth daily.   omeprazole 40 MG capsule Commonly known as:  PRILOSEC Take 1 capsule (40 mg total) by mouth daily. For reflux   senna-docusate 8.6-50 MG tablet Commonly known as:  Senokot-S Take 1 tablet by mouth at bedtime.   sertraline 100 MG tablet Commonly known as:  ZOLOFT Take 100 mg by mouth daily.   UNABLE TO FIND Med Name: Med Pass 90 mL by mouth twice daily       Review of Systems  Unable to perform ROS: Dementia    Immunization History  Administered Date(s) Administered  . Influenza Whole 03/06/2008  .  Influenza-Unspecified 03/06/2014  . PPD Test 08/20/2016  . Pneumococcal Polysaccharide-23 06/06/1998  . Td 06/06/2002  . Tdap 10/27/2015   Pertinent  Health Maintenance Due  Topic Date Due  . MAMMOGRAM  06/06/2018 (Originally 10/24/2012)  . PAP SMEAR  06/06/2018 (Originally 04/13/2012)  . PNA vac Low Risk Adult (2 of 2 - PCV13) 06/06/2018 (Originally 06/07/1999)  . INFLUENZA VACCINE  01/04/2017  . DEXA SCAN  Completed    Vitals:   09/07/16 1127  BP: 123/64  Pulse: 81  Resp: 18  Temp: Brandy.3 F (36.8 C)  TempSrc: Oral  SpO2: Brandy%  Weight: 125 lb 9.6 oz (57 kg)  Height:  (1.727 m)   Body mass index is 19.1 kg/m. Physical Exam  Constitutional: She appears well-developed and well-nourished. No distress.  HENT:  Head: Normocephalic.  Mouth/Throat: Oropharynx is clear and moist. No oropharyngeal exudate.  Eyes: Conjunctivae and EOM are normal. Pupils are equal, round, and reactive to light. Right eye exhibits no discharge. Left eye exhibits no discharge. No scleral icterus.  Neck: Normal range of motion. No JVD present. No thyromegaly present.  Cardiovascular: Normal rate, regular rhythm, normal heart sounds and intact distal pulses.  Exam reveals no gallop and no friction rub.   No murmur heard. Pulmonary/Chest: Effort normal and breath sounds normal. No respiratory distress. She has no wheezes. She has no rales. She exhibits no tenderness.  Abdominal: Soft. Bowel sounds are normal. She exhibits no distension. There is no tenderness. There is no rebound.  Genitourinary:  Genitourinary Comments: Incontinent   Musculoskeletal: She exhibits no edema, tenderness or deformity.  Moves x 4 extremities. Wheelchair bound.   Lymphadenopathy:    She has no cervical adenopathy.  Neurological: She is alert.  Pleasantly confused at her baseline.   Skin: Skin is warm and dry. No rash noted. No erythema. No pallor.  Psychiatric: She has a normal mood and affect.    Labs  reviewed:  Recent Labs  08/01/16 1831 08/02/16 0559 08/03/16 0448 08/08/16 08/16/16 1338  NA 156* 154* 143 144 144  K 4.2 3.5 3.7 3.9 4.3  CL 120* 118* 111  --   --   CO2 --   --   GLUCOSE 96 121* Brandy  --   --   BUN 30* 27* CREATININE 0.72 0.62 0.59 0.5 0.6  CALCIUM 9.3 8.8* 8.4*  --   --   MG 2.5*  --   --   --   --     Recent Labs  08/01/16 1831 08/02/16 0559 08/08/16  WBC 9.4 6.7 5.8  NEUTROABS 5.3  --   --   HGB 15.3* 13.9 12.7  HCT 48.5* 43.2 39  MCV 94.9 94.9  --   PLT 148* 115* 151   Lab Results  Component Value Date  TSH 1.58 08/08/2016   No results found for: HGBA1C Lab Results  Component Value Date   CHOL 204 (H) 01/05/2011   HDL 43.90 01/05/2011   LDLCALC 219 05/04/2009   LDLDIRECT 137.2 01/05/2011   TRIG 113.0 01/05/2011   CHOLHDL 5 01/05/2011    Assessment/Plan 1. HTN B/p stable.Continue on metoprolol.Continue to monitor BMP.   2. Hypothyroidism Lab Results  Component Value Date   TSH 1.58 08/08/2016  Continue on levothyroxine 50 mcg tablet. Monitor TSH level in three months.   3. Anxiety/Depression  Stable.Seen by Psychiatry service.Continue on Zoloft 100 mg Tablet daily. Continue to monitor for mood changes.   4. Hyperlipidemia  Obtain fasting lipid panel and Hgb A1C 09/08/2016.   5. GERD Continue on Omeprazole 40 mg capsule daily.check Mg level 09/08/2016.    Family/ staff Communication: Reviewed plan of care with facility Nurse supervisor.   Labs/tests ordered:  lipid panel, Hgb A1C and Mg Level 09/08/2016.

## 2016-09-08 LAB — LIPID PANEL
CHOLESTEROL: 282 mg/dL — AB (ref 0–200)
HDL: 37 mg/dL (ref 35–70)
LDL Cholesterol: 188 mg/dL
TRIGLYCERIDES: 289 mg/dL — AB (ref 40–160)

## 2016-09-08 LAB — HEMOGLOBIN A1C: Hemoglobin A1C: 5.1

## 2016-09-09 ENCOUNTER — Non-Acute Institutional Stay (SKILLED_NURSING_FACILITY): Payer: Medicare Other | Admitting: Family

## 2016-09-09 ENCOUNTER — Encounter: Payer: Self-pay | Admitting: Family

## 2016-09-09 DIAGNOSIS — E782 Mixed hyperlipidemia: Secondary | ICD-10-CM

## 2016-09-09 NOTE — Progress Notes (Signed)
Location:  [facility] Room Number: 202B Place of Service:  SNF (31) Provider:  Bethanne Ginger, FNP-C  Katy Apo, MD  Patient Care Team: Renford Dills, MD as PCP - General (Internal Medicine)  Extended Emergency Contact Information Primary Emergency Contact: Doggett,Kenneth Address: [address]          [place], [place] Home Phone: [phone] Work Phone: [phone] Relation: Other Secondary Emergency Contact: Sarajane Marek [place] of [place] Home Phone: [phone] Mobile Phone: [phone] Relation: Son  Code Status:  Full Code Goals of care: Advanced Directive information Advanced Directives 09/09/2016  Does Patient Have a Medical Advance Directive? No  Type of Advance Directive -  Does patient want to make changes to medical advance directive? -  Copy of Healthcare Power of Attorney in Chart? -  Would patient like information on creating a medical advance directive? -     Chief Complaint  Patient presents with  . Acute Visit    Abnormal labs    HPI:  Pt is a 83 y.o. female seen today for an acute visit for evaluation of abnormal lab results. She has a medical history of HTN, hyperlipidemia, hypothyroidism, Anxiety, Depression among other conditions. She is seen in her room today. She is pleasantly confused at baseline. Her Chol 282, LDL 188, TRG 289 ( 09/08/2016). Discussed patient's lab results on the phone with Patient's Son Tresa Res POA at [phone] states patient has always had elevated TRG. Due to her advance dementia will manage with heart Healthy diet for now. No medication desired.     Past Medical History:  Diagnosis Date  . Allergic rhinitis   . Anxiety   . Arthritis   . Dementia    neuro eval WNL, normal MRI, eval at behavioral health center after admission with dx mild-mod dementia  . Depression   . Herpes zoster 12/29/2006  . HLD (hyperlipidemia)   . HTN (hypertension)   . Migraine     . OSA (obstructive sleep apnea)    intermittent CPAP use  . Urinary incontinence    Past Surgical History:  Procedure Laterality Date  . ABDOMINAL HYSTERECTOMY     TAH for fibroids  . APPENDECTOMY    . NECK SURGERY     CALCIUM DEPOSITS    Allergies  Allergen Reactions  . Other Other (See Comments)    CRANBERRY JUICE  . Sulfonamide Derivatives Other (See Comments)    REACTION: Can't remember    Allergies as of 09/09/2016      Reactions   Other Other (See Comments)   CRANBERRY JUICE   Sulfonamide Derivatives Other (See Comments)   REACTION: Can't remember      Medication List       Accurate as of 09/09/16  2:30 PM. Always use your most recent med list.          CALCIUM 600+D 600-400 MG-UNIT tablet Generic drug:  Calcium Carbonate-Vitamin D Take 2 tablets by mouth daily. For bones   divalproex 125 MG capsule Commonly known as:  DEPAKOTE SPRINKLE Take 125 mg by mouth at bedtime.   levothyroxine 50 MCG tablet Commonly known as:  SYNTHROID, LEVOTHROID Take 50 mcg by mouth daily before breakfast.   metoprolol tartrate 25 MG tablet Commonly known as:  LOPRESSOR Take 12.5 mg by mouth daily.   multivitamin tablet Take 1 tablet by mouth daily.   multivitamin-lutein Caps capsule Take 1 capsule by mouth daily.   omeprazole 40 MG capsule Commonly known as:  PRILOSEC Take 1 capsule (40 mg total) by mouth daily. For reflux   senna-docusate 8.6-50 MG tablet Commonly known as:  Senokot-S Take 1 tablet by mouth at bedtime.   sertraline 100 MG tablet Commonly known as:  ZOLOFT Take 100 mg by mouth daily.   UNABLE TO FIND Med Name: Med Pass 90 mL by mouth twice daily       Review of Systems  Unable to perform ROS: Dementia    Immunization History  Administered Date(s) Administered  . Influenza Whole 03/06/2008  . Influenza-Unspecified 03/06/2014  . PPD Test 08/20/2016  . Pneumococcal Polysaccharide-23 06/06/1998  . Td 06/06/2002  . Tdap 10/27/2015    Pertinent  Health Maintenance Due  Topic Date Due  . MAMMOGRAM  06/06/2018 (Originally 10/24/2012)  . PAP SMEAR  06/06/2018 (Originally 04/13/2012)  . PNA vac Low Risk Adult (2 of 2 - PCV13) 06/06/2018 (Originally 06/07/1999)  . INFLUENZA VACCINE  01/04/2017  . DEXA SCAN  Completed    Vitals:   09/09/16 1403  BP: (!) 124/54  Pulse: 80  Resp: 18  Temp: 97.6 F (36.4 C)  Weight: 125 lb 9.6 oz (57 kg)  Height:  (1.727 m)   Body mass index is 19.1 kg/m. Physical Exam  Constitutional: She appears well-developed and well-nourished. No distress.  Pleasantly confused at her baseline   HENT:  Head: Normocephalic.  Mouth/Throat: Oropharynx is clear and moist. No oropharyngeal exudate.  Eyes: Conjunctivae and EOM are normal. Pupils are equal, round, and reactive to light. Right eye exhibits no discharge. Left eye exhibits no discharge. No scleral icterus.  Neck: Normal range of motion. No JVD present. No thyromegaly present.  Cardiovascular: Normal rate, regular rhythm, normal heart sounds and intact distal pulses.  Exam reveals no gallop and no friction rub.   No murmur heard. Pulmonary/Chest: Effort normal and breath sounds normal. No respiratory distress. She has no wheezes. She has no rales. She exhibits no tenderness.  Abdominal: Soft. Bowel sounds are normal. She exhibits no distension. There is no tenderness. There is no rebound.  Musculoskeletal: She exhibits no edema, tenderness or deformity.  Moves x 4 extremities. Wheelchair bound.   Lymphadenopathy:    She has no cervical adenopathy.  Neurological: She is alert.  Pleasantly confused at her baseline.   Skin: Skin is warm and dry. No rash noted. No erythema. No pallor.  Psychiatric: She has a normal mood and affect.    Labs reviewed:  Recent Labs  08/01/16 1831 08/02/16 0559 08/03/16 0448 08/08/16 08/16/16 1338  NA 156* 154* 143 144 144  K 4.2 3.5 3.7 3.9 4.3  CL 120* 118* 111  --   --   CO2 --   --    GLUCOSE 96 121* 98  --   --   BUN 30* 27* CREATININE 0.72 0.62 0.59 0.5 0.6  CALCIUM 9.3 8.8* 8.4*  --   --   MG 2.5*  --   --   --   --     Recent Labs  08/08/16  AST 15  ALT 9  ALKPHOS 60    Recent Labs  08/01/16 1831 08/02/16 0559 08/08/16  WBC 9.4 6.7 5.8  NEUTROABS 5.3  --   --   HGB 15.3* 13.9 12.7  HCT 48.5* 43.2 39  MCV 94.9 94.9  --   PLT 148* 115* 151   Lab Results  Component Value Date   TSH 1.58 08/08/2016  Lab Results  Component Value Date   HGBA1C 5.1 09/08/2016   Lab Results  Component Value Date   CHOL 282 (A) 09/08/2016   HDL 37 09/08/2016   LDLCALC 188 09/08/2016   LDLDIRECT 137.2 01/05/2011   TRIG 289 (A) 09/08/2016   CHOLHDL 5 01/05/2011   Assessment/Plan Hyperlipidemia  Chol 282, LDL 188, TRG 289 ( 09/08/2016). Discussed patient's lab results on the phone with Patient's Son POA at [phone] states patient has always had elevated TRG. Due to her advance dementia will manage with heart Healthy diet for now. No medication desired.     Family/ staff Communication: Reviewed plan of care with patient and facility Nurse supervisor.   Labs/tests ordered: None

## 2016-10-06 ENCOUNTER — Encounter: Payer: Self-pay | Admitting: Family

## 2016-10-06 ENCOUNTER — Non-Acute Institutional Stay (SKILLED_NURSING_FACILITY): Payer: Medicare Other | Admitting: Family

## 2016-10-06 DIAGNOSIS — E039 Hypothyroidism, unspecified: Secondary | ICD-10-CM

## 2016-10-06 DIAGNOSIS — K219 Gastro-esophageal reflux disease without esophagitis: Secondary | ICD-10-CM | POA: Diagnosis not present

## 2016-10-06 DIAGNOSIS — F341 Dysthymic disorder: Secondary | ICD-10-CM

## 2016-10-06 DIAGNOSIS — F0391 Unspecified dementia with behavioral disturbance: Secondary | ICD-10-CM | POA: Diagnosis not present

## 2016-10-06 NOTE — Progress Notes (Signed)
This encounter was created in error - please disregard.

## 2016-10-06 NOTE — Progress Notes (Signed)
Location:  [facility] Room Number: 202 B Place of Service:  SNF (31) Provider:  Nevae Pinnix FNP-C   Katy Apo, MD  Patient Care Team: Renford Dills, MD as PCP - General (Internal Medicine)  Extended Emergency Contact Information Primary Emergency Contact: Bergdoll,Kenneth Address: [phone]          [place], [place] Home Phone: [phone] Work Phone: [phone] Relation: Other Secondary Emergency Contact: Sarajane Marek [place] Home Phone: [phone] Mobile Phone: [phone] Relation: Son  Code Status:  Full Code  Goals of care: Advanced Directive information Advanced Directives 10/06/2016  Does Patient Have a Medical Advance Directive? No  Type of Advance Directive -  Does patient want to make changes to medical advance directive? -  Copy of Healthcare Power of Attorney in Chart? -  Would patient like information on creating a medical advance directive? -     Chief Complaint  Patient presents with  . Medical Management of Chronic Issues    Resident is being seen for a routine visit.     HPI:  Pt is a 83 y.o. female seen today at [facility] for medical management of chronic issues.She has a medical history of HTN,Hyperlipdidemia, Generalized anxiety, OSA, Hypothyroidism,Dementia among other conditions. She is seen in her room today.she is pleasantly confused at her baseline.she has had no recent acute illness or weight changes. Facility Nurse reports patient has good appetite and no new behavioral issues.she continues to require total care assistance with her ADL's.    Past Medical History:  Diagnosis Date  . Allergic rhinitis   . Anxiety   . Arthritis   . Dementia    neuro eval WNL, normal MRI, eval at behavioral health center after admission with dx mild-mod dementia  . Depression   . Herpes zoster 12/29/2006  . HLD (hyperlipidemia)   . HTN (hypertension)   . Migraine    . OSA (obstructive sleep apnea)    intermittent CPAP use  . Urinary incontinence    Past Surgical History:  Procedure Laterality Date  . ABDOMINAL HYSTERECTOMY     TAH for fibroids  . APPENDECTOMY    . NECK SURGERY     CALCIUM DEPOSITS    Allergies  Allergen Reactions  . Other Other (See Comments)    CRANBERRY JUICE  . Sulfonamide Derivatives Other (See Comments)    REACTION: Can't remember    Allergies as of 10/06/2016      Reactions   Other Other (See Comments)   CRANBERRY JUICE   Sulfonamide Derivatives Other (See Comments)   REACTION: Can't remember      Medication List       Accurate as of 10/06/16 12:52 PM. Always use your most recent med list.          CALCIUM 600+D 600-400 MG-UNIT tablet Generic drug:  Calcium Carbonate-Vitamin D Take 2 tablets by mouth daily. For bones   levothyroxine 50 MCG tablet Commonly known as:  SYNTHROID, LEVOTHROID Take 50 mcg by mouth daily before breakfast.   metoprolol tartrate 25 MG tablet Commonly known as:  LOPRESSOR Take 12.5 mg by mouth daily.   multivitamin tablet Take 1 tablet by mouth daily.   multivitamin-lutein Caps capsule Take 1 capsule by mouth daily.   omeprazole 40 MG capsule Commonly known as:  PRILOSEC Take 1 capsule (40 mg total) by mouth daily. For reflux   senna-docusate 8.6-50 MG tablet Commonly known as:  Senokot-S Take 1  tablet by mouth at bedtime.   sertraline 100 MG tablet Commonly known as:  ZOLOFT Take 100 mg by mouth daily.   UNABLE TO FIND Med Name: Med Pass 90 mL by mouth twice daily       Review of Systems  Unable to perform ROS: Dementia    Immunization History  Administered Date(s) Administered  . Influenza Whole 03/06/2008  . Influenza-Unspecified 03/06/2014  . PPD Test 08/20/2016  . Pneumococcal Polysaccharide-23 06/06/1998  . Td 06/06/2002  . Tdap 10/27/2015   Pertinent  Health Maintenance Due  Topic Date Due  . MAMMOGRAM  06/06/2018 (Originally 10/24/2012)  .  PAP SMEAR  06/06/2018 (Originally 04/13/2012)  . PNA vac Low Risk Adult (2 of 2 - PCV13) 06/06/2018 (Originally 06/07/1999)  . INFLUENZA VACCINE  01/04/2017  . DEXA SCAN  Completed    Vitals:   10/06/16 1006  BP: 105/63  Pulse: 63  Resp: 20  Temp: 97.4 F (36.3 C)  SpO2: 97%  Weight: 117 lb 8 oz (53.3 kg)  Height: 5\' 8"  (1.727 m)   Body mass index is 17.87 kg/m. Physical Exam  Constitutional: She appears well-developed and well-nourished. No distress.  HENT:  Head: Normocephalic.  Mouth/Throat: Oropharynx is clear and moist. No oropharyngeal exudate.  Eyes: Conjunctivae and EOM are normal. Pupils are equal, round, and reactive to light. Right eye exhibits no discharge. Left eye exhibits no discharge. No scleral icterus.  Neck: Normal range of motion. No JVD present. No thyromegaly present.  Cardiovascular: Normal rate, regular rhythm, normal heart sounds and intact distal pulses.  Exam reveals no gallop and no friction rub.   No murmur heard. Pulmonary/Chest: Effort normal and breath sounds normal. No respiratory distress. She has no wheezes. She has no rales. She exhibits no tenderness.  Abdominal: Soft. Bowel sounds are normal. She exhibits no distension. There is no tenderness. There is no rebound.  Genitourinary:  Genitourinary Comments: Incontinent   Musculoskeletal: She exhibits no edema, tenderness or deformity.  Moves x 4 extremities. Requires max assist with transfer. Spends most time on wheelchair.   Lymphadenopathy:    She has no cervical adenopathy.  Neurological: She is alert.  Pleasantly confused at her baseline.   Skin: Skin is warm and dry. No rash noted. No erythema. No pallor.  Psychiatric: She has a normal mood and affect.    Labs reviewed:  Recent Labs  08/01/16 1831 08/02/16 0559 08/03/16 0448 08/08/16 08/16/16 1338  NA 156* 154* 143 144 144  K 4.2 3.5 3.7 3.9 4.3  CL 120* 118* 111  --   --   CO2 29 28 29   --   --   GLUCOSE 96 121* 98  --   --     BUN 30* 27* 20 12 7   CREATININE 0.72 0.62 0.59 0.5 0.6  CALCIUM 9.3 8.8* 8.4*  --   --   MG 2.5*  --   --   --   --     Recent Labs  08/01/16 1831 08/02/16 0559 08/08/16  WBC 9.4 6.7 5.8  NEUTROABS 5.3  --   --   HGB 15.3* 13.9 12.7  HCT 48.5* 43.2 39  MCV 94.9 94.9  --   PLT 148* 115* 151   Lab Results  Component Value Date   TSH 1.58 08/08/2016   Lab Results  Component Value Date   HGBA1C 5.1 09/08/2016   Lab Results  Component Value Date   CHOL 282 (A) 09/08/2016   HDL 37 09/08/2016  LDLCALC 188 09/08/2016   LDLDIRECT 137.2 01/05/2011   TRIG 289 (A) 09/08/2016   CHOLHDL 5 01/05/2011    Assessment/Plan 1. Hypothyroidism Lab Results  Component Value Date   TSH 1.58 08/08/2016  Continue on levothyroxine 50 mcg tablet. Monitor TSH level.   2.  GERD Continue on Omeprazole 40 mg capsule daily.    3. Dementia with behavioral disturbance No new behavioral issues reported. Continue on Depakote 125 mg Capsule at bedtime. Continue to assist with ADL's.Fall and safety precautions. Continue Skin care.   4. Anxiety/Depression  Stable.Continue on Zoloft 100 mg Tablet daily. monitor for mood changes.   Family/ staff Communication: Reviewed plan of care with facility Nurse supervisor.   Labs/tests ordered: None

## 2016-10-21 ENCOUNTER — Encounter: Payer: Self-pay | Admitting: Family

## 2016-10-21 ENCOUNTER — Non-Acute Institutional Stay (SKILLED_NURSING_FACILITY): Payer: Medicare Other | Admitting: Family

## 2016-10-21 DIAGNOSIS — E781 Pure hyperglyceridemia: Secondary | ICD-10-CM | POA: Diagnosis not present

## 2016-10-21 MED ORDER — OMEGA-3-ACID ETHYL ESTERS 1 G PO CAPS: 1.0000 g | ORAL_CAPSULE | Freq: Two times a day (BID) | ORAL | Status: DC

## 2016-10-21 NOTE — Progress Notes (Signed)
Location:  [facility] Room Number: 202B Place of Service:  SNF (31) Provider:  Dinah Ngetich FNP-C   Renford DillsPolite, Ronald, MD  Patient Care Team: Renford DillsPolite, Ronald, MD as PCP - General (Internal Medicine)  Extended Emergency Contact Information Primary Emergency Contact: Hemp,Kenneth Address: [id number]          [place], [place] Home Phone: [phone] Work Phone: [phone] Relation: Other Secondary Emergency Contact: Sarajane MarekBrewer,Douglas  [place] Home Phone: [phone] Mobile Phone: [phone] Relation: Son  Code Status:  Full Code  Goals of care: Advanced Directive information Advanced Directives 10/21/2016  Does Patient Have a Medical Advance Directive? No  Type of Advance Directive -  Does patient want to make changes to medical advance directive? -  Copy of Healthcare Power of Attorney in Chart? -  Would patient like information on creating a medical advance directive? -     Chief Complaint  Patient presents with  . Acute Visit    Determine need for statin or omega 3 based on labs from April 2018    HPI:  Pt is a 83 y.o. female seen today at [facility] for medical management of chronic issues.She has a medical history of HTN,Hyperlipdidemia, Generalized anxiety, OSA, Hypothyroidism,Dementia among other conditions. She is seen in her room today.she is confused at baseline. Her last lab results Chol 282, LDL 188, TRG 289 ( 09/08/2016).Patient's son stated patient has had elevated TRG all her life relaxant to start on meds.she is not a candidate for Statin due to advance age to prevent muscle weakness.   Past Medical History:  Diagnosis Date  . Allergic rhinitis   . Anxiety   . Arthritis   . Dementia    neuro eval WNL, normal MRI, eval at behavioral health center after admission with dx mild-mod dementia  . Depression   . Herpes zoster 12/29/2006  . HLD (hyperlipidemia)   . HTN  (hypertension)   . Migraine   . OSA (obstructive sleep apnea)    intermittent CPAP use  . Urinary incontinence    Past Surgical History:  Procedure Laterality Date  . ABDOMINAL HYSTERECTOMY     TAH for fibroids  . APPENDECTOMY    . NECK SURGERY     CALCIUM DEPOSITS    Allergies  Allergen Reactions  . Other Other (See Comments)    CRANBERRY JUICE  . Sulfonamide Derivatives Other (See Comments)    REACTION: Can't remember    Allergies as of 10/21/2016      Reactions   Other Other (See Comments)   CRANBERRY JUICE   Sulfonamide Derivatives Other (See Comments)   REACTION: Can't remember      Medication List       Accurate as of 10/21/16 11:53 AM. Always use your most recent med list.          CALCIUM 600+D 600-400 MG-UNIT tablet Generic drug:  Calcium Carbonate-Vitamin D Take 2 tablets by mouth daily. For bones   levothyroxine 50 MCG tablet Commonly known as:  SYNTHROID, LEVOTHROID Take 50 mcg by mouth daily before breakfast.   metoprolol tartrate 25 MG tablet Commonly known as:  LOPRESSOR Take 12.5 mg by mouth daily.   multivitamin tablet Take 1 tablet by mouth daily.   multivitamin-lutein Caps capsule Take 1 capsule by mouth daily.   omeprazole 20 MG capsule Commonly known as:  PRILOSEC Take 20 mg by mouth daily.   senna-docusate 8.6-50 MG tablet Commonly known as:  Senokot-S Take 1 tablet by mouth at bedtime.   sertraline 100 MG tablet Commonly known as:  ZOLOFT Take 100 mg by mouth daily.   UNABLE TO FIND Take 90 mLs by mouth 2 (two) times daily. Med Name: Med Pass 2.0       Review of Systems  Unable to perform ROS: Dementia    Immunization History  Administered Date(s) Administered  . Influenza Whole 03/06/2008  . Influenza-Unspecified 03/06/2014  . PPD Test 08/09/2016, 08/20/2016  . Pneumococcal Polysaccharide-23 06/06/1998  . Td 06/06/2002  . Tdap 10/27/2015   Pertinent  Health Maintenance Due  Topic Date Due  . MAMMOGRAM   06/06/2018 (Originally 10/24/2012)  . PAP SMEAR  06/06/2018 (Originally 04/13/2012)  . PNA vac Low Risk Adult (2 of 2 - PCV13) 06/06/2018 (Originally 06/07/1999)  . INFLUENZA VACCINE  01/04/2017  . DEXA SCAN  Completed    Vitals:   10/21/16 0858  BP: 105/63  Pulse: 63  Resp: 20  Temp: 97.4 F (36.3 C)  SpO2: 97%  Weight: 117 lb (53.1 kg)  Height: 5\' 8"  (1.727 m)   Body mass index is 17.79 kg/m. Physical Exam  Constitutional: She appears well-developed and well-nourished. No distress.  Pleasantly confused elderly in no acute distress  HENT:  Head: Normocephalic.  Mouth/Throat: Oropharynx is clear and moist. No oropharyngeal exudate.  Eyes: Conjunctivae and EOM are normal. Pupils are equal, round, and reactive to light. Right eye exhibits no discharge. Left eye exhibits no discharge. No scleral icterus.  Neck: Normal range of motion. No JVD present. No thyromegaly present.  Cardiovascular: Normal rate, regular rhythm, normal heart sounds and intact distal pulses.  Exam reveals no gallop and no friction rub.   No murmur heard. Pulmonary/Chest: Effort normal and breath sounds normal. No respiratory distress. She has no wheezes. She has no rales. She exhibits no tenderness.  Abdominal: Soft. Bowel sounds are normal. She exhibits no distension. There is no tenderness. There is no rebound.  Genitourinary:  Genitourinary Comments: Incontinent   Musculoskeletal: She exhibits no edema, tenderness or deformity.  Moves x 4 extremities. Arthritic changes to both hands.   Lymphadenopathy:    She has no cervical adenopathy.  Neurological: She is alert.   confused at her baseline.   Skin: Skin is warm and dry. No rash noted. No erythema. No pallor.  Psychiatric: She has a normal mood and affect.    Labs reviewed:  Recent Labs  08/01/16 1831 08/02/16 0559 08/03/16 0448 08/08/16 08/16/16 1338  NA 156* 154* 143 144 144  K 4.2 3.5 3.7 3.9 4.3  CL 120* 118* 111  --   --   CO2 29 28 29    --   --   GLUCOSE 96 121* 98  --   --   BUN 30* 27* 20 12 7   CREATININE 0.72 0.62 0.59 0.5 0.6  CALCIUM 9.3 8.8* 8.4*  --   --   MG 2.5*  --   --   --   --     Recent Labs  08/01/16 1831 08/02/16 0559 08/08/16  WBC 9.4 6.7 5.8  NEUTROABS 5.3  --   --   HGB 15.3* 13.9 12.7  HCT 48.5* 43.2 39  MCV 94.9 94.9  --   PLT 148* 115* 151   Lab Results  Component Value Date   TSH 1.58 08/08/2016   Lab Results  Component Value Date   HGBA1C 5.1 09/08/2016   Lab Results  Component Value Date   CHOL  282 (A) 09/08/2016   HDL 37 09/08/2016   LDLCALC 188 09/08/2016   LDLDIRECT 137.2 01/05/2011   TRIG 289 (A) 09/08/2016   CHOLHDL 5 01/05/2011    Assessment/Plan  Hypertriglyceridemia    Chol 282, LDL 188, TRG 289 ( 09/08/2016).Will start on Omega 3 acid one gram capsule by mouth twice daily. Recheck fasting lipid panel in 3 months.   Family/ staff Communication: Reviewed plan of care with facility Nurse supervisor.   Labs/tests ordered: Fasting lipid panel in 3 months

## 2018-08-01 ENCOUNTER — Encounter (HOSPITAL_COMMUNITY): Payer: Self-pay

## 2018-08-01 ENCOUNTER — Emergency Department (HOSPITAL_COMMUNITY): Payer: Medicare Other

## 2018-08-01 ENCOUNTER — Inpatient Hospital Stay (HOSPITAL_COMMUNITY)
Admission: EM | Admit: 2018-08-01 | Discharge: 2018-08-03 | DRG: 193 | Disposition: A | Discharge status: Home / Self Care | Payer: Medicare Other | Attending: Internal Medicine | Admitting: Internal Medicine

## 2018-08-01 ENCOUNTER — Other Ambulatory Visit: Payer: Self-pay

## 2018-08-01 DIAGNOSIS — J189 Pneumonia, unspecified organism: Principal | ICD-10-CM

## 2018-08-01 DIAGNOSIS — Z87891 Personal history of nicotine dependence: Secondary | ICD-10-CM

## 2018-08-01 DIAGNOSIS — R4182 Altered mental status, unspecified: Secondary | ICD-10-CM | POA: Diagnosis not present

## 2018-08-01 DIAGNOSIS — Z681 Body mass index (BMI) 19 or less, adult: Secondary | ICD-10-CM

## 2018-08-01 DIAGNOSIS — Z79890 Hormone replacement therapy (postmenopausal): Secondary | ICD-10-CM

## 2018-08-01 DIAGNOSIS — Z8249 Family history of ischemic heart disease and other diseases of the circulatory system: Secondary | ICD-10-CM

## 2018-08-01 DIAGNOSIS — Z1612 Extended spectrum beta lactamase (ESBL) resistance: Secondary | ICD-10-CM | POA: Diagnosis present

## 2018-08-01 DIAGNOSIS — I10 Essential (primary) hypertension: Secondary | ICD-10-CM | POA: Diagnosis not present

## 2018-08-01 DIAGNOSIS — Z90710 Acquired absence of both cervix and uterus: Secondary | ICD-10-CM

## 2018-08-01 DIAGNOSIS — E785 Hyperlipidemia, unspecified: Secondary | ICD-10-CM | POA: Diagnosis present

## 2018-08-01 DIAGNOSIS — Z811 Family history of alcohol abuse and dependence: Secondary | ICD-10-CM

## 2018-08-01 DIAGNOSIS — G92 Toxic encephalopathy: Secondary | ICD-10-CM

## 2018-08-01 DIAGNOSIS — G4733 Obstructive sleep apnea (adult) (pediatric): Secondary | ICD-10-CM | POA: Diagnosis present

## 2018-08-01 DIAGNOSIS — G928 Other toxic encephalopathy: Secondary | ICD-10-CM

## 2018-08-01 DIAGNOSIS — F0390 Unspecified dementia without behavioral disturbance: Secondary | ICD-10-CM | POA: Diagnosis present

## 2018-08-01 DIAGNOSIS — Z66 Do not resuscitate: Secondary | ICD-10-CM | POA: Diagnosis present

## 2018-08-01 DIAGNOSIS — F0280 Dementia in other diseases classified elsewhere without behavioral disturbance: Secondary | ICD-10-CM | POA: Diagnosis present

## 2018-08-01 DIAGNOSIS — R627 Adult failure to thrive: Secondary | ICD-10-CM | POA: Diagnosis present

## 2018-08-01 DIAGNOSIS — F419 Anxiety disorder, unspecified: Secondary | ICD-10-CM | POA: Diagnosis present

## 2018-08-01 DIAGNOSIS — L8990 Pressure ulcer of unspecified site, unspecified stage: Secondary | ICD-10-CM

## 2018-08-01 DIAGNOSIS — F329 Major depressive disorder, single episode, unspecified: Secondary | ICD-10-CM | POA: Diagnosis present

## 2018-08-01 DIAGNOSIS — B9620 Unspecified Escherichia coli [E. coli] as the cause of diseases classified elsewhere: Secondary | ICD-10-CM | POA: Diagnosis present

## 2018-08-01 DIAGNOSIS — J181 Lobar pneumonia, unspecified organism: Secondary | ICD-10-CM

## 2018-08-01 DIAGNOSIS — N390 Urinary tract infection, site not specified: Secondary | ICD-10-CM

## 2018-08-01 DIAGNOSIS — G3183 Dementia with Lewy bodies: Secondary | ICD-10-CM | POA: Diagnosis present

## 2018-08-01 DIAGNOSIS — R1310 Dysphagia, unspecified: Secondary | ICD-10-CM | POA: Diagnosis present

## 2018-08-01 DIAGNOSIS — Z803 Family history of malignant neoplasm of breast: Secondary | ICD-10-CM

## 2018-08-01 DIAGNOSIS — Z79899 Other long term (current) drug therapy: Secondary | ICD-10-CM

## 2018-08-01 DIAGNOSIS — Z20828 Contact with and (suspected) exposure to other viral communicable diseases: Secondary | ICD-10-CM | POA: Diagnosis present

## 2018-08-01 DIAGNOSIS — Z807 Family history of other malignant neoplasms of lymphoid, hematopoietic and related tissues: Secondary | ICD-10-CM

## 2018-08-01 LAB — CBC WITH DIFFERENTIAL/PLATELET
Abs Immature Granulocytes: 0.03 10*3/uL (ref 0.00–0.07)
Basophils Absolute: 0 10*3/uL (ref 0.0–0.1)
Basophils Relative: 0 %
Eosinophils Absolute: 0.1 10*3/uL (ref 0.0–0.5)
Eosinophils Relative: 1 %
HCT: 51.5 % — ABNORMAL HIGH (ref 36.0–46.0)
HEMOGLOBIN: 15.6 g/dL — AB (ref 12.0–15.0)
Immature Granulocytes: 1 %
Lymphocytes Relative: 29 %
Lymphs Abs: 1.6 10*3/uL (ref 0.7–4.0)
MCH: 28.3 pg (ref 26.0–34.0)
MCHC: 30.3 g/dL (ref 30.0–36.0)
MCV: 93.5 fL (ref 80.0–100.0)
Monocytes Absolute: 0.6 10*3/uL (ref 0.1–1.0)
Monocytes Relative: 11 %
NEUTROS ABS: 3.2 10*3/uL (ref 1.7–7.7)
Neutrophils Relative %: 58 %
Platelets: 196 10*3/uL (ref 150–400)
RBC: 5.51 MIL/uL — ABNORMAL HIGH (ref 3.87–5.11)
RDW: 14.5 % (ref 11.5–15.5)
WBC: 5.6 10*3/uL (ref 4.0–10.5)
nRBC: 0 % (ref 0.0–0.2)

## 2018-08-01 LAB — URINALYSIS, ROUTINE W REFLEX MICROSCOPIC
Bilirubin Urine: NEGATIVE
Glucose, UA: NEGATIVE mg/dL
KETONES UR: NEGATIVE mg/dL
Nitrite: POSITIVE — AB
Protein, ur: NEGATIVE mg/dL
Specific Gravity, Urine: 1.015 (ref 1.005–1.030)
pH: 6 (ref 5.0–8.0)

## 2018-08-01 LAB — COMPREHENSIVE METABOLIC PANEL
ALT: 17 U/L (ref 0–44)
AST: 26 U/L (ref 15–41)
Albumin: 3.4 g/dL — ABNORMAL LOW (ref 3.5–5.0)
Alkaline Phosphatase: 121 U/L (ref 38–126)
Anion gap: 9 (ref 5–15)
BUN: 18 mg/dL (ref 8–23)
CO2: 27 mmol/L (ref 22–32)
Calcium: 9.1 mg/dL (ref 8.9–10.3)
Chloride: 105 mmol/L (ref 98–111)
Creatinine, Ser: 0.57 mg/dL (ref 0.44–1.00)
GFR calc Af Amer: >60 mL/min (ref >60)
GFR calc non Af Amer: >60 mL/min (ref >60)
Glucose, Bld: 88 mg/dL (ref 70–99)
POTASSIUM: 4.6 mmol/L (ref 3.5–5.1)
Sodium: 141 mmol/L (ref 135–145)
Total Bilirubin: 0.4 mg/dL (ref 0.3–1.2)
Total Protein: 7.4 g/dL (ref 6.5–8.1)

## 2018-08-01 LAB — LACTIC ACID, PLASMA: Lactic Acid, Venous: 1.1 mmol/L (ref 0.5–1.9)

## 2018-08-01 LAB — CBC
HCT: 46 % (ref 36.0–46.0)
HEMOGLOBIN: 14.1 g/dL (ref 12.0–15.0)
MCH: 28.9 pg (ref 26.0–34.0)
MCHC: 30.7 g/dL (ref 30.0–36.0)
MCV: 94.3 fL (ref 80.0–100.0)
Platelets: 176 10*3/uL (ref 150–400)
RBC: 4.88 MIL/uL (ref 3.87–5.11)
RDW: 14.4 % (ref 11.5–15.5)
WBC: 7 10*3/uL (ref 4.0–10.5)
nRBC: 0 % (ref 0.0–0.2)

## 2018-08-01 LAB — CREATININE, SERUM
CREATININE: 0.54 mg/dL (ref 0.44–1.00)
GFR calc Af Amer: >60 mL/min (ref >60)

## 2018-08-01 LAB — INFLUENZA PANEL BY PCR (TYPE A & B)
Influenza A By PCR: NEGATIVE
Influenza B By PCR: NEGATIVE

## 2018-08-01 LAB — TROPONIN I

## 2018-08-01 MED ORDER — SODIUM CHLORIDE 0.9 % IV SOLN
500.0000 mg | Freq: Every day | INTRAVENOUS | Status: DC
  Administered 2018-08-01 – 2018-08-02 (×2): 500 mg via INTRAVENOUS
  Filled 2018-08-01 (×3): qty 500

## 2018-08-01 MED ORDER — SODIUM CHLORIDE 0.9 % IV BOLUS
1000.0000 mL | Freq: Once | INTRAVENOUS | Status: AC
  Administered 2018-08-01: 1000 mL via INTRAVENOUS

## 2018-08-01 MED ORDER — SODIUM CHLORIDE 0.9 % IV SOLN
100.0000 mg | Freq: Once | INTRAVENOUS | Status: DC
  Filled 2018-08-01: qty 100

## 2018-08-01 MED ORDER — OSELTAMIVIR PHOSPHATE 75 MG PO CAPS
75.0000 mg | ORAL_CAPSULE | Freq: Every day | ORAL | Status: DC
  Administered 2018-08-01 – 2018-08-02 (×2): 75 mg via ORAL
  Filled 2018-08-01 (×2): qty 1

## 2018-08-01 MED ORDER — ACETAMINOPHEN 325 MG PO TABS
650.0000 mg | ORAL_TABLET | Freq: Four times a day (QID) | ORAL | Status: DC | PRN
  Filled 2018-08-01: qty 2

## 2018-08-01 MED ORDER — ENOXAPARIN SODIUM 40 MG/0.4ML ~~LOC~~ SOLN
40.0000 mg | Freq: Every day | SUBCUTANEOUS | Status: DC
  Administered 2018-08-02 (×2): 40 mg via SUBCUTANEOUS
  Filled 2018-08-01 (×2): qty 0.4

## 2018-08-01 MED ORDER — METRONIDAZOLE IN NACL 5-0.79 MG/ML-% IV SOLN
500.0000 mg | Freq: Once | INTRAVENOUS | Status: AC
  Administered 2018-08-01: 500 mg via INTRAVENOUS
  Filled 2018-08-01: qty 100

## 2018-08-01 MED ORDER — SODIUM CHLORIDE 0.9% FLUSH
3.0000 mL | Freq: Once | INTRAVENOUS | Status: AC
  Administered 2018-08-01: 3 mL via INTRAVENOUS

## 2018-08-01 MED ORDER — SODIUM CHLORIDE 0.9 % IV SOLN
1.0000 g | Freq: Once | INTRAVENOUS | Status: AC
  Administered 2018-08-01: 1 g via INTRAVENOUS
  Filled 2018-08-01: qty 10

## 2018-08-01 MED ORDER — SODIUM CHLORIDE 0.9 % IV SOLN
1.0000 g | INTRAVENOUS | Status: DC
  Administered 2018-08-02: 1 g via INTRAVENOUS
  Filled 2018-08-01: qty 1

## 2018-08-01 MED ORDER — ACETAMINOPHEN 650 MG RE SUPP: 650.0000 mg | Freq: Four times a day (QID) | RECTAL | Status: DC | PRN

## 2018-08-01 NOTE — ED Notes (Signed)
Attempted to call report, RN did not answer phone x2.

## 2018-08-01 NOTE — ED Notes (Addendum)
ED TO INPATIENT HANDOFF REPORT  Name/Age/Gender Brandy [name] 85 y.o. female  Code Status    Code Status Orders  (From admission, onward)         Start     Ordered   08/01/18 1830  Do not attempt resuscitation (DNR)  Continuous    Question Answer Comment  In the event of cardiac or respiratory ARREST Do not call a "code blue"   In the event of cardiac or respiratory ARREST Do not perform Intubation, CPR, defibrillation or ACLS   In the event of cardiac or respiratory ARREST Use medication by any route, position, wound care, and other measures to relive pain and suffering. May use oxygen, suction and manual treatment of airway obstruction as needed for comfort.      08/01/18 1830        Code Status History    Date Active Date Inactive Code Status Order ID Comments User Context   08/01/2016 2139 08/03/2016 1853 DNR [id number]  Eduard Clos, MD ED   08/15/2012 0933 08/20/2012 2138 DNR [id number]  Jannette Fogo, NP Inpatient   08/13/2012 1641 08/15/2012 0933 Full Code [id number]  Clydia Llano, MD Inpatient    Advance Directive Documentation     Most Recent Value  Type of Advance Directive  Healthcare Power of Metta Clines has a most form]  Pre-existing out of facility DNR order (yellow form or pink MOST form)  Pink MOST form placed in chart (order not valid for inpatient use)  "MOST" Form in Place?  -      Home/SNF/Other Home  Chief Complaint not responding  Level of Care/Admitting Diagnosis ED Disposition    ED Disposition Condition Comment   Admit  Hospital Area: [facility] [[id number]]  Level of Care: Med-Surg [16]  Diagnosis: Pneumonia [227785]  Admitting Physician: Jerald Kief [[id number]]  Attending Physician: CHIU, STEPHEN K [[id number]]  PT Class (Do Not Modify): Observation [104]  PT Acc Code (Do Not Modify): Observation [10022]       Medical History Past Medical History:  Diagnosis Date  . Allergic rhinitis   . Anxiety   . Arthritis    . Dementia (HCC)    neuro eval WNL, normal MRI, eval at behavioral [facility] after admission with dx mild-mod dementia  . Depression   . Herpes zoster 12/29/2006  . HLD (hyperlipidemia)   . HTN (hypertension)   . Migraine   . OSA (obstructive sleep apnea)    intermittent CPAP use  . Urinary incontinence     Allergies Allergies  Allergen Reactions  . Other Other (See Comments)    CRANBERRY JUICE  . Sulfonamide Derivatives Other (See Comments)    REACTION: Can't remember    IV Location/Drains/Wounds Patient Lines/Drains/Airways Status   Active Line/Drains/Airways    Name:   Placement date:   Placement time:   Site:   Days:   Peripheral IV 08/01/18 Left Wrist   08/01/18    1440    Wrist   less than 1   Peripheral IV 08/01/18 Right Antecubital   08/01/18    1445    Antecubital   less than 1          Labs/Imaging Results for orders placed or performed during the hospital encounter of 08/01/18 (from the past 48 hour(s))  Comprehensive metabolic panel     Status: Abnormal   Collection Time: 08/01/18  2:48 PM  Result Value Ref Range   Sodium 141 135 - 145 mmol/L  Potassium 4.6 3.5 - 5.1 mmol/L   Chloride 105 98 - 111 mmol/L   CO2 27 22 - 32 mmol/L   Glucose, Bld 88 70 - 99 mg/dL   BUN 18 8 - 23 mg/dL   Creatinine, Ser 1.61 0.44 - 1.00 mg/dL   Calcium 9.1 8.9 - 09.6 mg/dL   Total Protein 7.4 6.5 - 8.1 g/dL   Albumin 3.4 (L) 3.5 - 5.0 g/dL   AST 26 15 - 41 U/L   ALT 17 0 - 44 U/L   Alkaline Phosphatase 121 38 - 126 U/L   Total Bilirubin 0.4 0.3 - 1.2 mg/dL   GFR calc non Af Amer >60 >60 mL/min   GFR calc Af Amer >60 >60 mL/min   Anion gap 9 5 - 15    Comment: Performed at [facility], [address] [place], [place] [address]  CBC with Differential     Status: Abnormal   Collection Time: 08/01/18  2:48 PM  Result Value Ref Range   WBC 5.6 4.0 - 10.5 K/uL   RBC 5.51 (H) 3.87 - 5.11 MIL/uL   Hemoglobin 15.6 (H) 12.0 - 15.0 g/dL   HCT 98.1  (H) 19.1 - 46.0 %   MCV 93.5 80.0 - 100.0 fL   MCH 28.3 26.0 - 34.0 pg   MCHC 30.3 30.0 - 36.0 g/dL   RDW 47.8 29.5 - 62.1 %   Platelets 196 150 - 400 K/uL   nRBC 0.0 0.0 - 0.2 %   Neutrophils Relative % 58 %   Neutro Abs 3.2 1.7 - 7.7 K/uL   Lymphocytes Relative 29 %   Lymphs Abs 1.6 0.7 - 4.0 K/uL   Monocytes Relative 11 %   Monocytes Absolute 0.6 0.1 - 1.0 K/uL   Eosinophils Relative 1 %   Eosinophils Absolute 0.1 0.0 - 0.5 K/uL   Basophils Relative 0 %   Basophils Absolute 0.0 0.0 - 0.1 K/uL   Immature Granulocytes 1 %   Abs Immature Granulocytes 0.03 0.00 - 0.07 K/uL    Comment: Performed at [facility], [address], [place], [place] [address]  Troponin I - ONCE - STAT     Status: None   Collection Time: 08/01/18  2:48 PM  Result Value Ref Range   Troponin I <0.03 <0.03 ng/mL    Comment: Performed at [facility], [address] [place], [place] [address]  Lactic acid, plasma     Status: None   Collection Time: 08/01/18  2:50 PM  Result Value Ref Range   Lactic Acid, Venous 1.1 0.5 - 1.9 mmol/L    Comment: Performed at [facility], [address] [place], [place] [address]  Influenza panel by PCR (type A & B)     Status: None   Collection Time: 08/01/18  3:19 PM  Result Value Ref Range   Influenza A By PCR NEGATIVE NEGATIVE   Influenza B By PCR NEGATIVE NEGATIVE    Comment: (NOTE) The Xpert Xpress Flu assay is intended as an aid in the diagnosis of  influenza and should not be used as a sole basis for treatment.  This  assay is FDA approved for nasopharyngeal swab specimens only. Nasal  washings and aspirates are unacceptable for Xpert Xpress Flu testing. Performed at [facility], [address] [place], [place] [address]   Urinalysis, Routine w reflex microscopic     Status: Abnormal   Collection Time: 08/01/18  3:20 PM  Result Value Ref Range   Color, Urine YELLOW YELLOW    APPearance CLOUDY (A) CLEAR   Specific Gravity, Urine 1.015 1.005 - 1.030   pH 6.0 5.0 - 8.0   Glucose, UA NEGATIVE NEGATIVE mg/dL   Hgb urine dipstick SMALL (A) NEGATIVE   Bilirubin Urine NEGATIVE NEGATIVE   Ketones, ur NEGATIVE NEGATIVE mg/dL   Protein, ur NEGATIVE NEGATIVE mg/dL   Nitrite POSITIVE (A) NEGATIVE   Leukocytes,Ua MODERATE (A) NEGATIVE   RBC / HPF 11-20 0 - 5 RBC/hpf   WBC, UA >50 (H) 0 - 5 WBC/hpf   Bacteria, UA MANY (A) NONE SEEN   Mucus PRESENT     Comment: Performed at [facility], [address], [place], [place] [address]  CBC     Status: None   Collection Time: 08/01/18  6:28 PM  Result Value Ref Range   WBC 7.0 4.0 - 10.5 K/uL   RBC 4.88 3.87 - 5.11 MIL/uL   Hemoglobin 14.1 12.0 - 15.0 g/dL   HCT 37.1 06.2 - 69.4 %   MCV 94.3 80.0 - 100.0 fL   MCH 28.9 26.0 - 34.0 pg   MCHC 30.7 30.0 - 36.0 g/dL   RDW 85.4 62.7 - 03.5 %   Platelets 176 150 - 400 K/uL   nRBC 0.0 0.0 - 0.2 %    Comment: Performed at [facility], [address] [place], [place] [address]  Creatinine, serum     Status: None   Collection Time: 08/01/18  6:28 PM  Result Value Ref Range   Creatinine, Ser 0.54 0.44 - 1.00 mg/dL   GFR calc non Af Amer >60 >60 mL/min   GFR calc Af Amer >60 >60 mL/min    Comment: Performed at [facility], [address] [place], [place] [address]   Ct Head Wo Contrast  Result Date: 08/01/2018 CLINICAL DATA:  Altered level of consciousness. EXAM: CT HEAD WITHOUT CONTRAST TECHNIQUE: Contiguous axial images were obtained from the base of the skull through the vertex without intravenous contrast. COMPARISON:  CT head without contrast 10/27/2015 FINDINGS: Brain: Advanced atrophy and white matter disease is similar the prior exams. No acute infarct, hemorrhage, or mass lesion is present. The ventricles are of proportionate to the degree of atrophy. No significant extraaxial fluid collection is present.  Vascular: No hyperdense vessel or unexpected calcification. Skull: Calvarium is intact. No focal lytic or blastic lesions are present. Sinuses/Orbits: The paranasal sinuses and mastoid air cells are clear. The globes and orbits are within normal limits. IMPRESSION: Stable advanced atrophy and white matter disease likely reflects the sequela of chronic microvascular ischemia. No acute intracranial abnormality. Electronically Signed   By: Marin Roberts M.D.   On: 08/01/2018 16:39   Dg Chest Portable 1 View  Result Date: 08/01/2018 CLINICAL DATA:  Fever and lethargy. EXAM: PORTABLE CHEST 1 VIEW COMPARISON:  08/01/2016 FINDINGS: The cardiac silhouette, mediastinal and hilar contours are within normal limits and stable. There is moderate tortuosity and calcification of the thoracic aorta. Patchy right basilar airspace opacity is likely an infiltrate. The left lung is clear. No pleural effusions. IMPRESSION: Right basilar infiltrate. Electronically Signed   By: Rudie Meyer M.D.   On: 08/01/2018 16:25    Pending Labs Unresulted Labs (From admission, onward)    Start     Ordered   08/08/18 0500  Creatinine, serum  (enoxaparin (LOVENOX)    CrCl >/= 30 ml/min)  Weekly,  R    Comments:  while on enoxaparin therapy    08/01/18 1830   08/02/18 0500  Comprehensive metabolic panel  Tomorrow morning,   R     08/01/18 1830   08/02/18 0500  CBC  Tomorrow morning,   R     08/01/18 1830   08/01/18 1507  Urine culture  ONCE - STAT,   STAT     08/01/18 1506   08/01/18 1506  Culture, blood (routine x 2)  BLOOD CULTURE X 2,   STAT     08/01/18 1505          Vitals/Pain Today's Vitals   08/01/18 1730 08/01/18 1800 08/01/18 1830 08/01/18 1900  BP: (!) 145/78 (!) 152/73 (!) 153/74 137/72  Pulse: 65 69 69 69  Resp: 12 17 18 17   Temp:      TempSrc:      SpO2: 95% 94% 95% 95%  Weight:      Height:        Isolation Precautions Droplet precaution  Medications Medications  enoxaparin (LOVENOX)  injection 40 mg (has no administration in time range)  acetaminophen (TYLENOL) tablet 650 mg (has no administration in time range)    Or  acetaminophen (TYLENOL) suppository 650 mg (has no administration in time range)  cefTRIAXone (ROCEPHIN) 1 g in sodium chloride 0.9 % 100 mL IVPB (has no administration in time range)  azithromycin (ZITHROMAX) 500 mg in sodium chloride 0.9 % 250 mL IVPB (has no administration in time range)  oseltamivir (TAMIFLU) capsule 75 mg (has no administration in time range)  sodium chloride flush (NS) 0.9 % injection 3 mL (3 mLs Intravenous Given 08/01/18 1451)  sodium chloride 0.9 % bolus 1,000 mL (1,000 mLs Intravenous Bolus 08/01/18 1528)  cefTRIAXone (ROCEPHIN) 1 g in sodium chloride 0.9 % 100 mL IVPB (0 g Intravenous Stopped 08/01/18 1715)  metroNIDAZOLE (FLAGYL) IVPB 500 mg (0 mg Intravenous Stopped 08/01/18 1825)    Mobility Non-ambulatory

## 2018-08-01 NOTE — ED Notes (Signed)
Droplet precautions removed since flu was negative.

## 2018-08-01 NOTE — H&P (Addendum)
History and Physical    KEILI HASTEN RUE:[id number] DOB: [birth year] DOA: 08/01/2018  PCP: Renford Dills, MD  Patient coming from: [facility] facility  Chief Complaint: Mental status change  HPI: KRITI KATAYAMA is a 85 y.o. female with medical history significant of dementia, HTN presented to ED from facility with acute mental status change. Pt not good historian given known dementia, thus majority of history obtained form other sources. Pt noted to have increased lethargy over past several days prior to visit with report of fevers. Patient noted to be in close contact with multiple other residents who tested pos for flu. Pt subsequently brought to ED for further eval  ED Course: In ED, pt noted to be encephalopathic, unable to converse initially. Pt normotensive, afebrile with no leukocytosis. Flu swab was neg for influenza. UA was consistent with UTI and CXR with findings of R sided infiltrate worrisome for PNA. Patient was given empiric rocephin, doxycycline, flagyl and given 1L IVF. Mentation did improve somewhat, closer to baseline. Given patient's advanced age, hospitalist consulted for consideration for medical admission.  Review of Systems:  Review of Systems  Unable to perform ROS: Dementia    Past Medical History:  Diagnosis Date  . Allergic rhinitis   . Anxiety   . Arthritis   . Dementia (HCC)    neuro eval WNL, normal MRI, eval at behavioral health center after admission with dx mild-mod dementia  . Depression   . Herpes zoster 12/29/2006  . HLD (hyperlipidemia)   . HTN (hypertension)   . Migraine   . OSA (obstructive sleep apnea)    intermittent CPAP use  . Urinary incontinence     Past Surgical History:  Procedure Laterality Date  . ABDOMINAL HYSTERECTOMY     TAH for fibroids  . APPENDECTOMY    . NECK SURGERY     CALCIUM DEPOSITS     reports that she quit smoking about 49 years ago. Her smoking use included cigarettes. She has a 16.00 pack-year smoking  history. She has never used smokeless tobacco. She reports that she does not drink alcohol or use drugs.  Allergies  Allergen Reactions  . Other Other (See Comments)    CRANBERRY JUICE  . Sulfonamide Derivatives Other (See Comments)    REACTION: Can't remember    Family History  Problem Relation Age of Onset  . Heart attack Father        in 32's  . Alcohol abuse Father   . Lymphoma Mother   . Cancer Mother        LYMPHOMA  . Coronary artery disease Maternal Grandmother   . Breast cancer Maternal Grandmother   . Sudden death Maternal Grandfather     Prior to Admission medications   Medication Sig Start Date End Date Taking? Authorizing Provider  atorvastatin (LIPITOR) 10 MG tablet Take 10 mg by mouth daily. 07/26/18  Yes [provider]  divalproex (DEPAKOTE SPRINKLE) 125 MG capsule Take 250 mg by mouth daily.  07/31/18  Yes [provider]  levothyroxine (SYNTHROID, LEVOTHROID) 50 MCG tablet Take 50 mcg by mouth daily before breakfast.   Yes [provider]  metoprolol tartrate (LOPRESSOR) 25 MG tablet Take 12.5 mg by mouth daily.   Yes [provider]  ranitidine (ZANTAC) 150 MG tablet Take 150 mg by mouth 2 (two) times daily.  07/24/18  Yes [provider]  senna-docusate (SENOKOT-S) 8.6-50 MG per tablet Take 1 tablet by mouth at bedtime.    Yes [provider]  sertraline (ZOLOFT) 25 MG tablet Take 25 mg by mouth daily. 07/08/18  Yes [provider]  sertraline (ZOLOFT) 50 MG tablet Take 50 mg by mouth daily.   Yes [provider]  omega-3 acid ethyl esters (LOVAZA) 1 g capsule Take 1 capsule (1 g total) by mouth 2 (two) times daily. Patient not taking: Reported on 08/01/2018 10/21/16   Ngetich, Dinah C, NP  oseltamivir (TAMIFLU) 75 MG capsule Take 75 mg by mouth daily. 08/01/18   [provider]    Physical Exam: Vitals:   08/01/18 1630 08/01/18 1645 08/01/18 1700 08/01/18 1730  BP: (!) 154/80  (!)  146/65 (!) 145/78  Pulse: 72 78 61 65  Resp: 16 12 14 12   Temp:      TempSrc:      SpO2: 95% 95% 95% 95%  Weight:      Height:        Constitutional: NAD, calm, comfortable Vitals:   08/01/18 1630 08/01/18 1645 08/01/18 1700 08/01/18 1730  BP: (!) 154/80  (!) 146/65 (!) 145/78  Pulse: 72 78 61 65  Resp: 16 12 14 12   Temp:      TempSrc:      SpO2: 95% 95% 95% 95%  Weight:      Height:       Eyes: PERRL, lids and conjunctivae normal ENMT: Mucous membranes are moist. Posterior pharynx clear of any exudate or lesions.Normal dentition.  Neck: normal, supple, no masses, no thyromegaly Respiratory: clear to auscultation bilaterally, no wheezing, no crackles. Normal respiratory effort. No accessory muscle use.  Cardiovascular: Regular rate and rhythm, s1, s2 Abdomen: no tenderness, no masses palpated. No hepatosplenomegaly. Bowel sounds positive.  Musculoskeletal: no clubbing / cyanosis. No joint deformity upper and lower extremities. Good ROM, no contractures. Normal muscle tone.  Skin: no rashes, lesions, ulcers. No induration Neurologic: CN 2-12 grossly intact. Sensation intact, DTR normal. Strength 5/5 in all 4.  Psychiatric: Appears confused, mood appears intact, confused   Labs on Admission: I have personally reviewed following labs and imaging studies  CBC: Recent Labs  Lab 08/01/18 1448  WBC 5.6  NEUTROABS 3.2  HGB 15.6*  HCT 51.5*  MCV 93.5  PLT 196   Basic Metabolic Panel: Recent Labs  Lab 08/01/18 1448  NA 141  K 4.6  CL 105  CO2 27  GLUCOSE 88  BUN 18  CREATININE 0.57  CALCIUM 9.1   GFR: Estimated Creatinine Clearance: 42.3 mL/min (by C-G formula based on SCr of 0.57 mg/dL). Liver Function Tests: Recent Labs  Lab 08/01/18 1448  AST 26  ALT 17  ALKPHOS 121  BILITOT 0.4  PROT 7.4  ALBUMIN 3.4*   No results for input(s): LIPASE, AMYLASE in the last 168 hours. No results for input(s): AMMONIA in the last 168 hours. Coagulation Profile: No  results for input(s): INR, PROTIME in the last 168 hours. Cardiac Enzymes: Recent Labs  Lab 08/01/18 1448  TROPONINI <0.03   BNP (last 3 results) No results for input(s): PROBNP in the last 8760 hours. HbA1C: No results for input(s): HGBA1C in the last 72 hours. CBG: No results for input(s): GLUCAP in the last 168 hours. Lipid Profile: No results for input(s): CHOL, HDL, LDLCALC, TRIG, CHOLHDL, LDLDIRECT in the last 72 hours. Thyroid Function Tests: No results for input(s): TSH, T4TOTAL, FREET4, T3FREE, THYROIDAB in the last 72 hours. Anemia Panel: No results for input(s): VITAMINB12, FOLATE, FERRITIN, TIBC, IRON, RETICCTPCT in the last 72 hours. Urine analysis:  Component Value Date/Time   COLORURINE YELLOW 08/01/2018 1520   APPEARANCEUR CLOUDY (A) 08/01/2018 1520   LABSPEC 1.015 08/01/2018 1520   PHURINE 6.0 08/01/2018 1520   GLUCOSEU NEGATIVE 08/01/2018 1520   HGBUR SMALL (A) 08/01/2018 1520   HGBUR negative 04/20/2010 0755   BILIRUBINUR NEGATIVE 08/01/2018 1520   BILIRUBINUR neg 12/26/2011 1627   KETONESUR NEGATIVE 08/01/2018 1520   PROTEINUR NEGATIVE 08/01/2018 1520   UROBILINOGEN 2.0 (H) 08/13/2012 1225   NITRITE POSITIVE (A) 08/01/2018 1520   LEUKOCYTESUR MODERATE (A) 08/01/2018 1520   Sepsis Labs: !!!!!!!!!!!!!!!!!!!!!!!!!!!!!!!!!!!!!!!!!!!! @LABRCNTIP (procalcitonin:4,lacticidven:4) )No results found for this or any previous visit (from the past 240 hour(s)).   Radiological Exams on Admission: Ct Head Wo Contrast  Result Date: 08/01/2018 CLINICAL DATA:  Altered level of consciousness. EXAM: CT HEAD WITHOUT CONTRAST TECHNIQUE: Contiguous axial images were obtained from the base of the skull through the vertex without intravenous contrast. COMPARISON:  CT head without contrast 10/27/2015 FINDINGS: Brain: Advanced atrophy and white matter disease is similar the prior exams. No acute infarct, hemorrhage, or mass lesion is present. The ventricles are of proportionate  to the degree of atrophy. No significant extraaxial fluid collection is present. Vascular: No hyperdense vessel or unexpected calcification. Skull: Calvarium is intact. No focal lytic or blastic lesions are present. Sinuses/Orbits: The paranasal sinuses and mastoid air cells are clear. The globes and orbits are within normal limits. IMPRESSION: Stable advanced atrophy and white matter disease likely reflects the sequela of chronic microvascular ischemia. No acute intracranial abnormality. Electronically Signed   By: Marin Roberts M.D.   On: 08/01/2018 16:39   Dg Chest Portable 1 View  Result Date: 08/01/2018 CLINICAL DATA:  Fever and lethargy. EXAM: PORTABLE CHEST 1 VIEW COMPARISON:  08/01/2016 FINDINGS: The cardiac silhouette, mediastinal and hilar contours are within normal limits and stable. There is moderate tortuosity and calcification of the thoracic aorta. Patchy right basilar airspace opacity is likely an infiltrate. The left lung is clear. No pleural effusions. IMPRESSION: Right basilar infiltrate. Electronically Signed   By: Rudie Meyer M.D.   On: 08/01/2018 16:25    EKG: Independently reviewed. NSR  Assessment/Plan Principal Problem:   Toxic metabolic encephalopathy Active Problems:   Acute lower UTI   Senile dementia, uncomplicated (HCC)   Essential hypertension, benign   Pneumonia, organism unspecified(486)   Lewy body dementia without behavioral disturbance (HCC)   1. Toxic metabolic encephalopathy, acute 1. Presented with acute mental status change at facility involving decreased level of alertness 2. Suspect encephalopathy related to UTI and PNA noted at time of presentation, see below 3. Cont below abx 2. UTI 1. Presenting UA pos for UTI 2. Urine culture obtained, pending 3. Continue empiric rocephin per below 4. Repeat bmet and CBC in aM 3. PNA 1. CXR personally reviewed 2. Will continue azithromycin and rocephin 3. Per staff, food particles noted in mouth  at time of presentation, giving concern of possible aspiration 4. Will consult SLP for evaluation 4. Lewy body dementia 1. Will continue to monitor. Pt noted to have baseline dementia 5. HTN 1. BP stable at this time 6. Influenza exposure 1. Multiple residents at facility pos for flu 2. Flu swab neg at time of presentation 3. Would continue total 10 days of empiric tamiflu for prophylaxis  DVT prophylaxis: Lovenox subQ  Code Status: DNR - confirmed with family in room Family Communication: Pt in room  Disposition Plan: Uncertain at this time  Consults called:  Admission status: Observation as would likely require less  than 2 midnight stay to treat PNA and UTI   Rickey BarbaraStephen  MD [facility] Pager On Amion  If 7PM-7AM, please contact night-coverage  08/01/2018, 5:46 PM

## 2018-08-01 NOTE — ED Provider Notes (Signed)
[facility]-EMERGENCY DEPT Provider Note   CSN: [id number] Arrival date & time: 08/01/18  1419    History   Chief Complaint Chief Complaint  Patient presents with  . Altered Mental Status    HPI Brandy [name] is a 85 y.o. female.     HPI   Collateral information obtained from RN at [facility], [employer].  She states that she is overall and familiar with this patient, as she was floated to this unit, however the note from the previous nurse that took care of the patient state that patient was found "unresponsive" in her wheelchair at the lunch table.  They state that she was diaphoretic.  They state that she is often nonverbal at baseline, but does make some nonsensical statements.  She was nonverbal at this afternoon.  She does have a roommate with influenza currently and there are 6+ cases of influenza at [facility].  Additional collateral information obtained from patient's family.  They state that they last saw the patient 1 week ago and she was at her baseline which includes speaking non-nonsensical phrases, and alert to voice, however she does sometimes sleep consistently through the day and does not wake up at times even when they are calling her name.  Level 5 caveat altered mental status.  Past Medical History:  Diagnosis Date  . Allergic rhinitis   . Anxiety   . Arthritis   . Dementia (HCC)    neuro eval WNL, normal MRI, eval at behavioral health center after admission with dx mild-mod dementia  . Depression   . Herpes zoster 12/29/2006  . HLD (hyperlipidemia)   . HTN (hypertension)   . Migraine   . OSA (obstructive sleep apnea)    intermittent CPAP use  . Urinary incontinence     Patient Active Problem List   Diagnosis Date Noted  . Lewy body dementia without behavioral disturbance (HCC)   . Hypernatremia 08/01/2016  . Pneumonia, organism unspecified(486) 07/13/2013  . Unspecified urinary incontinence 02/16/2013  . Psychosis (HCC)  12/24/2012  . Hypothyroidism 10/18/2012  . Senile dementia, uncomplicated (HCC) 10/18/2012  . Essential hypertension, benign 10/18/2012  . Dysphagia, unspecified(787.20) 08/15/2012  . Adult failure to thrive 08/15/2012  . Unspecified constipation 08/15/2012  . Encephalopathy, metabolic 08/14/2012  . Urge incontinence 12/27/2011  . Leg swelling 12/27/2011  . Ganglion cyst 06/25/2011  . Thoracic back pain 04/26/2011  . GERD 11/16/2009  . OBSTRUCTIVE SLEEP APNEA 04/17/2009  . Dementia (HCC) 04/08/2009  . ANXIETY DEPRESSION 09/26/2008  . Hyperlipidemia 03/26/2008  . COMMON MIGRAINE 03/26/2008  . ALLERGIC RHINITIS 03/26/2008    Past Surgical History:  Procedure Laterality Date  . ABDOMINAL HYSTERECTOMY     TAH for fibroids  . APPENDECTOMY    . NECK SURGERY     CALCIUM DEPOSITS     OB History    Gravida  4   Para  3   Term  3   Preterm      AB  1   Living  3     SAB  1   TAB      Ectopic      Multiple      Live Births  3            Home Medications    Prior to Admission medications   Medication Sig Start Date End Date Taking? Authorizing Provider  atorvastatin (LIPITOR) 10 MG tablet Take 10 mg by mouth daily. 07/26/18  Yes [provider]  divalproex (DEPAKOTE SPRINKLE) 125 MG capsule Take 250 mg by mouth daily.  07/31/18  Yes [provider]  levothyroxine (SYNTHROID, LEVOTHROID) 50 MCG tablet Take 50 mcg by mouth daily before breakfast.   Yes [provider]  metoprolol tartrate (LOPRESSOR) 25 MG tablet Take 12.5 mg by mouth daily.   Yes [provider]  ranitidine (ZANTAC) 150 MG tablet Take 150 mg by mouth 2 (two) times daily.  07/24/18  Yes [provider]  senna-docusate (SENOKOT-S) 8.6-50 MG per tablet Take 1 tablet by mouth at bedtime.    Yes [provider]  sertraline (ZOLOFT) 25 MG tablet Take 25 mg by mouth daily. 07/08/18  Yes [provider]  sertraline (ZOLOFT) 50 MG tablet Take  50 mg by mouth daily.   Yes [provider]  omega-3 acid ethyl esters (LOVAZA) 1 g capsule Take 1 capsule (1 g total) by mouth 2 (two) times daily. Patient not taking: Reported on 08/01/2018 10/21/16   Ngetich, Dinah C, NP  oseltamivir (TAMIFLU) 75 MG capsule Take 75 mg by mouth daily. 08/01/18   [provider]    Family History Family History  Problem Relation Age of Onset  . Heart attack Father        in 63's  . Alcohol abuse Father   . Lymphoma Mother   . Cancer Mother        LYMPHOMA  . Coronary artery disease Maternal Grandmother   . Breast cancer Maternal Grandmother   . Sudden death Maternal Grandfather     Social History Social History   Tobacco Use  . Smoking status: Former Smoker    Packs/day: 2.00    Years: 8.00    Pack years: 16.00    Types: Cigarettes    Last attempt to quit: 06/06/1969    Years since quitting: 49.1  . Smokeless tobacco: Never Used  Substance Use Topics  . Alcohol use: No  . Drug use: No     Allergies   Other and Sulfonamide derivatives   Review of Systems Review of Systems  Constitutional: Negative for chills and fever.  Gastrointestinal: Negative for nausea and vomiting.  Psychiatric/Behavioral: Negative for agitation.    Level 5 caveat altered mental status.  Physical Exam Updated Vital Signs BP (!) 152/79 (BP Location: Left Arm)   Pulse 71   Temp 99.4 F (37.4 C) (Rectal)   Resp 14   Ht 5\' 8"  (1.727 m)   Wt 53.1 kg   SpO2 95%   BMI 17.80 kg/m   Physical Exam Vitals signs and nursing note reviewed.  Constitutional:      Appearance: She is well-developed.     Comments: Alert to voice after several reattempts.  Chronically ill-appearing.  Thin and frail appearing.  HENT:     Head: Normocephalic and atraumatic.     Mouth/Throat:     Mouth: Mucous membranes are dry.  Eyes:     Conjunctiva/sclera: Conjunctivae normal.     Pupils: Pupils are equal, round, and reactive to light.  Neck:      Musculoskeletal: Normal range of motion and neck supple.  Cardiovascular:     Rate and Rhythm: Normal rate and regular rhythm.     Heart sounds: S1 normal and S2 normal. No murmur.  Pulmonary:     Effort: Pulmonary effort is normal.     Breath sounds: Rales present. No wheezing.     Comments: Coarse crackles in bilateral lung bases. Abdominal:     General: There  is no distension.     Palpations: Abdomen is soft.     Tenderness: There is no abdominal tenderness. There is no guarding.  Genitourinary:    Comments: Sacral examination performed with RN chaperone present.  Patient has a padded bandage overlying the sacrum.  There is no surrounding erythema.  All skin surrounding the sacrum is blanching. Musculoskeletal: Normal range of motion.        General: No deformity.  Lymphadenopathy:     Cervical: No cervical adenopathy.  Skin:    General: Skin is warm and dry.     Findings: No erythema or rash.  Neurological:     Comments: Alert to voice with movement but does not vocalize; does not follow commands.  Does open eyes to sternal rub.  Slightly increased tone in all extremities.       ED Treatments / Results  Labs (all labs ordered are listed, but only abnormal results are displayed) Labs Reviewed  CBC WITH DIFFERENTIAL/PLATELET - Abnormal; Notable for the following components:      Result Value   RBC 5.51 (*)    Hemoglobin 15.6 (*)    HCT 51.5 (*)    All other components within normal limits  CULTURE, BLOOD (ROUTINE X 2)  CULTURE, BLOOD (ROUTINE X 2)  URINE CULTURE  LACTIC ACID, PLASMA  LACTIC ACID, PLASMA  COMPREHENSIVE METABOLIC PANEL  URINALYSIS, ROUTINE W REFLEX MICROSCOPIC  INFLUENZA PANEL BY PCR (TYPE A & B)    EKG None  Radiology No results found.  Procedures Procedures (including critical care time)  Medications Ordered in ED Medications  sodium chloride flush (NS) 0.9 % injection 3 mL (3 mLs Intravenous Given 08/01/18 1451)  sodium chloride 0.9 %  bolus 1,000 mL (1,000 mLs Intravenous Bolus 08/01/18 1528)     Initial Impression / Assessment and Plan / ED Course  I have reviewed the triage vital signs and the nursing notes.  Pertinent labs & imaging results that were available during my care of the patient were reviewed by me and considered in my medical decision making (see chart for details).  Clinical Course as of Aug 02 1739  Wed Aug 01, 2018  1606 Do not suspect severe sepsis.   Lactic Acid, Venous: 1.1 [AM]  1639 RLL infiltrate. Will treat for aspiration PNA.   DG Chest Portable 1 View [AM]  1739 Spoke with Dr. Craige Cotta of tried hospitalist will admit patient.  Appreciate his involvement in the care of this patient.   [AM]    Clinical Course User Index [AM] Elisha Ponder, PA-C       Patient nontoxic-appearing, hemodynamically stable with normal vital signs.  Differential diagnosis includes: ICH / Stroke, ACS, Sepsis syndrome, Infection - UTI/Pneumonia, Encephalopathy, Electrolyte abnormality, Drug overdose, DKA, Metabolic disorders including thyroid disorders, adrenal insufficiency, Cancer of unknown origin / paraneoplastic process, Hypercapnia / COPD, Hypoxia  Considered meningitis, however patient has 2 other sources of infection.  Additionally, she is improving with fluids and antibiotics here in the emergency department.  Work-up significant for no leukocytosis.  Normal renal function.  Patient does have urinary tract infection.  She also has right lower lobe infiltrate.  Influenza testing is negative.  At this time, mental status likely secondary to infection.  Will treat with Rocephin, Flagyl, and doxycycline.  Will admit.  This is a shared visit with Dr. Derwood Kaplan. Patient was independently evaluated by this attending physician. Attending physician consulted in evaluation and management.  Final Clinical Impressions(s) / ED Diagnoses  Final diagnoses:  Altered mental status, unspecified altered mental status  type  Urinary tract infection without hematuria, site unspecified  Community acquired pneumonia of right lower lobe of lung [facility])    ED Discharge Orders    None       Delia Chimes 08/01/18 1743    Derwood Kaplan, MD 08/02/18 [phone]

## 2018-08-01 NOTE — ED Notes (Signed)
Hospitalist at bedside 

## 2018-08-01 NOTE — ED Triage Notes (Signed)
Pt coming from [facility] facility complaining of a fever and increased lethargy. Per EMS pt's roommate recently dx with the flu. Pt is non-verbal at this time.

## 2018-08-01 NOTE — Progress Notes (Signed)
Pt's daughter states she has questions regarding the most form and where it is marked to only transfer patient for comfort measures and states they may want to change the choice on it. Instructed her to talk with admitting doctor regarding these concerns. She states pt's husband has hcpoa when questioned if pt has hcpoa.  Briscoe Burns BSN, RN-BC Admissions RN 08/01/2018 5:43 PM

## 2018-08-01 NOTE — ED Notes (Signed)
Bed: WA02 Expected date:  Expected time:  Means of arrival:  Comments: EMS/<L.O.C.

## 2018-08-02 DIAGNOSIS — J189 Pneumonia, unspecified organism: Secondary | ICD-10-CM | POA: Diagnosis present

## 2018-08-02 DIAGNOSIS — Z811 Family history of alcohol abuse and dependence: Secondary | ICD-10-CM | POA: Diagnosis not present

## 2018-08-02 DIAGNOSIS — N390 Urinary tract infection, site not specified: Secondary | ICD-10-CM | POA: Diagnosis present

## 2018-08-02 DIAGNOSIS — R1310 Dysphagia, unspecified: Secondary | ICD-10-CM | POA: Diagnosis present

## 2018-08-02 DIAGNOSIS — Z20828 Contact with and (suspected) exposure to other viral communicable diseases: Secondary | ICD-10-CM | POA: Diagnosis present

## 2018-08-02 DIAGNOSIS — J181 Lobar pneumonia, unspecified organism: Secondary | ICD-10-CM

## 2018-08-02 DIAGNOSIS — Z1612 Extended spectrum beta lactamase (ESBL) resistance: Secondary | ICD-10-CM | POA: Diagnosis present

## 2018-08-02 DIAGNOSIS — Z807 Family history of other malignant neoplasms of lymphoid, hematopoietic and related tissues: Secondary | ICD-10-CM | POA: Diagnosis not present

## 2018-08-02 DIAGNOSIS — F329 Major depressive disorder, single episode, unspecified: Secondary | ICD-10-CM | POA: Diagnosis present

## 2018-08-02 DIAGNOSIS — F0280 Dementia in other diseases classified elsewhere without behavioral disturbance: Secondary | ICD-10-CM | POA: Diagnosis present

## 2018-08-02 DIAGNOSIS — G92 Toxic encephalopathy: Secondary | ICD-10-CM | POA: Diagnosis not present

## 2018-08-02 DIAGNOSIS — F419 Anxiety disorder, unspecified: Secondary | ICD-10-CM | POA: Diagnosis present

## 2018-08-02 DIAGNOSIS — Z8249 Family history of ischemic heart disease and other diseases of the circulatory system: Secondary | ICD-10-CM | POA: Diagnosis not present

## 2018-08-02 DIAGNOSIS — G3183 Dementia with Lewy bodies: Secondary | ICD-10-CM | POA: Diagnosis present

## 2018-08-02 DIAGNOSIS — Z681 Body mass index (BMI) 19 or less, adult: Secondary | ICD-10-CM | POA: Diagnosis not present

## 2018-08-02 DIAGNOSIS — Z87891 Personal history of nicotine dependence: Secondary | ICD-10-CM | POA: Diagnosis not present

## 2018-08-02 DIAGNOSIS — R627 Adult failure to thrive: Secondary | ICD-10-CM | POA: Diagnosis present

## 2018-08-02 DIAGNOSIS — G4733 Obstructive sleep apnea (adult) (pediatric): Secondary | ICD-10-CM | POA: Diagnosis present

## 2018-08-02 DIAGNOSIS — B9620 Unspecified Escherichia coli [E. coli] as the cause of diseases classified elsewhere: Secondary | ICD-10-CM | POA: Diagnosis present

## 2018-08-02 DIAGNOSIS — Z79890 Hormone replacement therapy (postmenopausal): Secondary | ICD-10-CM | POA: Diagnosis not present

## 2018-08-02 DIAGNOSIS — Z803 Family history of malignant neoplasm of breast: Secondary | ICD-10-CM | POA: Diagnosis not present

## 2018-08-02 DIAGNOSIS — R4182 Altered mental status, unspecified: Secondary | ICD-10-CM | POA: Diagnosis not present

## 2018-08-02 DIAGNOSIS — E785 Hyperlipidemia, unspecified: Secondary | ICD-10-CM | POA: Diagnosis present

## 2018-08-02 DIAGNOSIS — I10 Essential (primary) hypertension: Secondary | ICD-10-CM | POA: Diagnosis not present

## 2018-08-02 DIAGNOSIS — Z66 Do not resuscitate: Secondary | ICD-10-CM | POA: Diagnosis present

## 2018-08-02 DIAGNOSIS — Z90710 Acquired absence of both cervix and uterus: Secondary | ICD-10-CM | POA: Diagnosis not present

## 2018-08-02 LAB — CBC
HCT: 44.2 % (ref 36.0–46.0)
Hemoglobin: 13.5 g/dL (ref 12.0–15.0)
MCH: 28.4 pg (ref 26.0–34.0)
MCHC: 30.5 g/dL (ref 30.0–36.0)
MCV: 93.1 fL (ref 80.0–100.0)
Platelets: 170 10*3/uL (ref 150–400)
RBC: 4.75 MIL/uL (ref 3.87–5.11)
RDW: 14.1 % (ref 11.5–15.5)
WBC: 5 10*3/uL (ref 4.0–10.5)
nRBC: 0 % (ref 0.0–0.2)

## 2018-08-02 LAB — COMPREHENSIVE METABOLIC PANEL
ALK PHOS: 102 U/L (ref 38–126)
ALT: 16 U/L (ref 0–44)
AST: 25 U/L (ref 15–41)
Albumin: 2.9 g/dL — ABNORMAL LOW (ref 3.5–5.0)
Anion gap: 7 (ref 5–15)
BUN: 14 mg/dL (ref 8–23)
CALCIUM: 8.4 mg/dL — AB (ref 8.9–10.3)
CO2: 26 mmol/L (ref 22–32)
Chloride: 107 mmol/L (ref 98–111)
Creatinine, Ser: 0.45 mg/dL (ref 0.44–1.00)
GFR calc Af Amer: >60 mL/min (ref >60)
GFR calc non Af Amer: >60 mL/min (ref >60)
Glucose, Bld: 79 mg/dL (ref 70–99)
Potassium: 3.8 mmol/L (ref 3.5–5.1)
Sodium: 140 mmol/L (ref 135–145)
Total Bilirubin: 0.9 mg/dL (ref 0.3–1.2)
Total Protein: 6.1 g/dL — ABNORMAL LOW (ref 6.5–8.1)

## 2018-08-02 MED ORDER — OSELTAMIVIR PHOSPHATE 30 MG PO CAPS
30.0000 mg | ORAL_CAPSULE | Freq: Every day | ORAL | Status: DC
  Filled 2018-08-02: qty 1

## 2018-08-02 NOTE — Progress Notes (Signed)
LCSW consulted for dc planning.   Patient is LTC at [facility].   LCSW will follow for dc needs.   Beulah Gandy Billingsley Long CSW [phone]

## 2018-08-02 NOTE — Evaluation (Signed)
Clinical/Bedside Swallow Evaluation Patient Details  Name: Brandy [name] MRN: [id number] Date of Birth: 1930/01/18  Today's Date: 08/02/2018 Time: SLP Start Time (ACUTE ONLY): 0908 SLP Stop Time (ACUTE ONLY): 0940 SLP Time Calculation (min) (ACUTE ONLY): 32 min  Past Medical History:  Past Medical History:  Diagnosis Date  . Allergic rhinitis   . Anxiety   . Arthritis   . Dementia (HCC)    neuro eval WNL, normal MRI, eval at behavioral health center after admission with dx mild-mod dementia  . Depression   . Herpes zoster 12/29/2006  . HLD (hyperlipidemia)   . HTN (hypertension)   . Migraine   . OSA (obstructive sleep apnea)    intermittent CPAP use  . Urinary incontinence    Past Surgical History:  Past Surgical History:  Procedure Laterality Date  . ABDOMINAL HYSTERECTOMY     TAH for fibroids  . APPENDECTOMY    . NECK SURGERY     CALCIUM DEPOSITS   HPI:  85 yo female adm to [facility] from SNF.  Pt was found down with food residuals in her mouth - noted to have a UTI and pna.  PMH + for dementia, GERD.  Pt has a MOST form completed in the chart - which indicated no feeding tube.     Assessment / Plan / Recommendation Clinical Impression  Patient presents with cognitive based dysphagia evidenced by indications of delay in oral transiting and holding.  Pt benefited from lingual pressure to tongue to elicit swallow trigger.  No indications of aspiration with all po observed - orange juice, grits, and biscuit.  Pt will need assistance to compensate for her cognitive based dysphagia. She also has h/o GERD and reflux precautions and indicated.  Recommend dys3/ground meat/thin diet with precautions. Skilled intervention including determining compensation strategies to mitigate dysphagia.  SLP to sign off at this time.   SLP Visit Diagnosis: Dysphagia, oral phase (R13.11)    Aspiration Risk  Moderate aspiration risk    Diet Recommendation Dysphagia 3 (Mech soft);Thin liquid    Liquid Administration via: Cup;Straw Medication Administration: Crushed with puree Supervision: Full supervision/cueing for compensatory strategies;Staff to assist with self feeding Compensations: Minimize environmental distractions;Slow rate;Small sips/bites;Other (Comment)(dry spoon pressure to mouth to aid elicit of swallow) Postural Changes: Remain upright for at least 30 minutes after po intake;Seated upright at 90 degrees    Other  Recommendations Oral Care Recommendations: Oral care prior to ice chip/H20   Follow up Recommendations None      Frequency and Duration     n/a       Prognosis   given dementia and concern for intake, guarded for po adequacy      Swallow Study   General Date of Onset: 08/02/18 HPI: 85 yo female adm to [facility] from SNF.  Pt was found down with food residuals in her mouth - noted to have a UTI and pna.  PMH + for dementia, GERD.  Pt has a MOST form completed in the chart - which indicated no feeding tube.   Type of Study: Bedside Swallow Evaluation Diet Prior to this Study: Regular;Thin liquids Temperature Spikes Noted: No Respiratory Status: Room air History of Recent Intubation: No Behavior/Cognition: Lethargic/Drowsy;Other (Comment);Doesn't follow directions(sleepy but would participate in po) Oral Cavity Assessment: Dry Oral Cavity - Dentition: Other (Comment);Poor condition Self-Feeding Abilities: Total assist(hand over hand assist) Patient Positioning: Upright in bed Baseline Vocal Quality: Normal(when pt phonates) Volitional Cough: Cognitively unable to elicit Volitional Swallow: Unable to elicit  Oral/Motor/Sensory Function Overall Oral Motor/Sensory Function: Other (comment)(pt did not participate in OME but able to seal lips on straw, demonstrated strong voice and no facial asymmetry)   Ice Chips Ice chips: Not tested   Thin Liquid Thin Liquid: Impaired Presentation: Straw;Cup;Spoon Pharyngeal  Phase Impairments: Suspected delayed  Swallow    Nectar Thick Nectar Thick Liquid: Not tested   Honey Thick Honey Thick Liquid: Not tested   Puree Puree: Impaired Presentation: Self Fed;Spoon Oral Phase Impairments: Reduced lingual movement/coordination Oral Phase Functional Implications: Prolonged oral transit Pharyngeal Phase Impairments: Suspected delayed Swallow   Solid     Solid: Impaired Presentation: Self Fed Oral Phase Impairments: Reduced lingual movement/coordination;Impaired mastication Oral Phase Functional Implications: Prolonged oral transit;Oral holding Other Comments: use of dry spoon pressure to tongue to elicit swallow      Chales Abrahams 08/02/2018,10:03 AM   Donavan Burnet, MS [facility] SLP Acute Rehab Services Pager [phone] Office [phone]

## 2018-08-02 NOTE — Progress Notes (Signed)
PHARMACY NOTE:  ANTIMICROBIAL RENAL DOSAGE ADJUSTMENT  Current antimicrobial regimen includes a mismatch between antimicrobial dosage and estimated renal function.  As per policy approved by the Pharmacy & Therapeutics and Medical Executive Committees, the antimicrobial dosage will be adjusted accordingly.  Current antimicrobial dosage:  oseltamivir 75 PO daily x 9 days   Indication: influenza prophylaxis  Renal Function:  Estimated Creatinine Clearance: 42.3 mL/min (by C-G formula based on SCr of 0.45 mg/dL). []      On intermittent HD, scheduled: []      On CRRT    Antimicrobial dosage has been changed to:  oseltamivir 30 mg PO daily x 9 days  Additional comments:   Thank you for allowing pharmacy to be a part of this patient's care.    Adalberto Cole, PharmD, BCPS Pager [phone] 08/02/2018 2:14 PM

## 2018-08-02 NOTE — Evaluation (Signed)
Physical Therapy Evaluation-1x Patient Details Name: Brandy [name] MRN: [id number] DOB: 01/27/1930 Today's Date: 08/02/2018   History of Present Illness  85 yo female admitted with encephalopathy, UTI, Pna. Hx of dementia  Clinical Impression  On eval, pt was Total Assist +2 for mobility. Pt does not follow commands. No family present during session. Pt is likely at baseline. Pt is not appropriate for PT. 1x eval. Will sign off.     Follow Up Recommendations No PT follow up    Equipment Recommendations  None recommended by PT    Recommendations for Other Services       Precautions / Restrictions Precautions Precautions: Fall Restrictions Weight Bearing Restrictions: No      Mobility  Bed Mobility Overal bed mobility: Needs Assistance Bed Mobility: Supine to Sit;Sit to Supine     Supine to sit: Total assist;+2 for physical assistance;+2 for safety/equipment Sit to supine: Total assist;+2 for physical assistance;+2 for safety/equipment   General bed mobility comments: Assist for trunk and bil LEs. Utilized bedpad. Pt did not follow commands.  Transfers                 General transfer comment: NT  Ambulation/Gait                Stairs            Wheelchair Mobility    Modified Rankin (Stroke Patients Only)       Balance Overall balance assessment: Needs assistance   Sitting balance-Leahy Scale: Poor Sitting balance - Comments: Mod-Max assist for static sitting balance. Knees flexed ~45 degrees. Mod-Heavy R lateral lean Postural control: Right lateral lean                                   Pertinent Vitals/Pain Pain Assessment: Faces Faces Pain Scale: No hurt    Home Living Family/patient expects to be discharged to:: Skilled nursing facility                      Prior Function Level of Independence: Needs assistance         Comments: no family present. Pt unable to provide history.      Hand  Dominance        Extremity/Trunk Assessment   Upper Extremity Assessment Upper Extremity Assessment: Defer to OT evaluation    Lower Extremity Assessment Lower Extremity Assessment: Difficult to assess due to impaired cognition    Cervical / Trunk Assessment Cervical / Trunk Assessment: Kyphotic  Communication   Communication: (nonsensical )  Cognition Arousal/Alertness: Awake/alert Behavior During Therapy: WFL for tasks assessed/performed Overall Cognitive Status: History of cognitive impairments - at baseline                                        General Comments      Exercises     Assessment/Plan    PT Assessment Patent does not need any further PT services  PT Problem List         PT Treatment Interventions      PT Goals (Current goals can be found in the Care Plan section)  Acute Rehab PT Goals PT Goal Formulation: All assessment and education complete, DC therapy    Frequency     Barriers to discharge  Co-evaluation   Reason for Co-Treatment: For patient/therapist safety PT goals addressed during session: Mobility/safety with mobility;Balance OT goals addressed during session: Strengthening/ROM;ADL's and self-care       AM-PAC PT "6 Clicks" Mobility  Outcome Measure Help needed turning from your back to your side while in a flat bed without using bedrails?: Total Help needed moving from lying on your back to sitting on the side of a flat bed without using bedrails?: Total Help needed moving to and from a bed to a chair (including a wheelchair)?: Total Help needed standing up from a chair using your arms (e.g., wheelchair or bedside chair)?: Total Help needed to walk in hospital room?: Total Help needed climbing 3-5 steps with a railing? : Total 6 Click Score: 6    End of Session   Activity Tolerance: Patient tolerated treatment well Patient left: in bed;with bed alarm set        Time: 9798-9211 PT Time Calculation  (min) (ACUTE ONLY): 20 min   Charges:   PT Evaluation $PT Eval Moderate Complexity: 1 Mod            Rebeca Alert, PT Acute Rehabilitation Services Pager: [phone] Office: [phone]

## 2018-08-02 NOTE — Progress Notes (Signed)
Patient confused, refused po medication and continues to tell staff to leave the room and leave her alone.

## 2018-08-02 NOTE — Evaluation (Signed)
Occupational Therapy Evaluation Patient Details Name: Brandy [name] MRN: [id number] DOB: May 14, 1930 Today's Date: 08/02/2018    History of Present Illness 85 yo female admitted with encephalopathy, UTI, Pna. Hx of dementia and arthritis   Clinical Impression   Pt was admitted for the above. Unsure of PLOF.  Pt needs total A at this time. Will defer further OT intervention to snf     Follow Up Recommendations  SNF    Equipment Recommendations  None recommended by OT    Recommendations for Other Services       Precautions / Restrictions Precautions Precautions: Fall Restrictions Weight Bearing Restrictions: No      Mobility Bed Mobility Overal bed mobility: Needs Assistance Bed Mobility: Supine to Sit;Sit to Supine     Supine to sit: Total assist;+2 for physical assistance;+2 for safety/equipment Sit to supine: Total assist;+2 for physical assistance;+2 for safety/equipment   General bed mobility comments: Assist for trunk and bil LEs. Utilized bedpad. Pt did not follow commands.  Transfers                 General transfer comment: NT    Balance Overall balance assessment: Needs assistance   Sitting balance-Leahy Scale: Poor Sitting balance - Comments: Mod-Max assist for static sitting balance. Knees flexed ~45 degrees. Mod-Heavy R lateral lean Postural control: Right lateral lean                                 ADL either performed or assessed with clinical judgement   ADL Overall ADL's : Needs assistance/impaired                                       General ADL Comments: pt needs total assist for all adls, +2 for bed level LB adls     Vision         Perception     Praxis      Pertinent Vitals/Pain Pain Assessment: Faces Faces Pain Scale: No hurt     Hand Dominance     Extremity/Trunk Assessment Upper Extremity Assessment Upper Extremity Assessment: Difficult to assess due to impaired cognition(L hand  does not open fully)   Lower Extremity Assessment Lower Extremity Assessment: Difficult to assess due to impaired cognition   Cervical / Trunk Assessment Cervical / Trunk Assessment: Kyphotic   Communication Communication Communication: (nonsensical )   Cognition Arousal/Alertness: Awake/alert Behavior During Therapy: WFL for tasks assessed/performed Overall Cognitive Status: History of cognitive impairments - at baseline                                     General Comments  pt pleasant but did not engage in activities.  Ate a few bites and took a few sips when food was offered; extra time to swallow    Exercises     Shoulder Instructions      Home Living Family/patient expects to be discharged to:: Skilled nursing facility                                        Prior Functioning/Environment Level of Independence: Needs assistance        Comments: no  family present. Pt unable to provide history.         OT Problem List:        OT Treatment/Interventions:      OT Goals(Current goals can be found in the care plan section) Acute Rehab OT Goals Patient Stated Goal: unable to state OT Goal Formulation: All assessment and education complete, DC therapy  OT Frequency:     Barriers to D/C:            Co-evaluation PT/OT/SLP Co-Evaluation/Treatment: Yes Reason for Co-Treatment: For patient/therapist safety PT goals addressed during session: Mobility/safety with mobility;Balance OT goals addressed during session: Strengthening/ROM;ADL's and self-care      AM-PAC OT "6 Clicks" Daily Activity     Outcome Measure Help from another person eating meals?: Total Help from another person taking care of personal grooming?: Total Help from another person toileting, which includes using toliet, bedpan, or urinal?: Total Help from another person bathing (including washing, rinsing, drying)?: Total Help from another person to put on and  taking off regular upper body clothing?: Total Help from another person to put on and taking off regular lower body clothing?: Total 6 Click Score: 6   End of Session    Activity Tolerance: Patient limited by fatigue Patient left: in bed;with call bell/phone within reach;with bed alarm set  OT Visit Diagnosis: Muscle weakness (generalized) (M62.81);Cognitive communication deficit (R41.841)                Time: 1319-1340 OT Time Calculation (min): 21 min Charges:  OT General Charges $OT Visit: 1 Visit OT Evaluation $OT Eval Low Complexity: 1 Low  Marica Otter, OTR/L Acute Rehabilitation Services [phone] [facility] pager [phone] office 08/02/2018  Devine Klingel 08/02/2018, 2:48 PM

## 2018-08-02 NOTE — Progress Notes (Signed)
PROGRESS NOTE    Brandy [name]  HUD:[id number] DOB: 10-23-1929 DOA: 08/01/2018 PCP: Renford Dills, MD    Brief Narrative:  85 y.o. female with medical history significant of dementia, HTN presented to ED from facility with acute mental status change. Pt not good historian given known dementia, thus majority of history obtained form other sources. Pt noted to have increased lethargy over past several days prior to visit with report of fevers. Patient noted to be in close contact with multiple other residents who tested pos for flu. Pt subsequently brought to ED for further eval  ED Course: In ED, pt noted to be encephalopathic, unable to converse initially. Pt normotensive, afebrile with no leukocytosis. Flu swab was neg for influenza. UA was consistent with UTI and CXR with findings of R sided infiltrate worrisome for PNA. Patient was given empiric rocephin, doxycycline, flagyl and given 1L IVF. Mentation did improve somewhat, closer to baseline. Given patient's advanced age, hospitalist consulted for consideration for medical admission.  Assessment & Plan:   Principal Problem:   Toxic metabolic encephalopathy Active Problems:   Acute lower UTI   Senile dementia, uncomplicated (HCC)   Essential hypertension, benign   Pneumonia, organism unspecified(486)   Lewy body dementia without behavioral disturbance (HCC)   Pneumonia   1. Toxic metabolic encephalopathy, acute 1. Presented with acute mental status change at facility involving decreased level of alertness 2. Suspect encephalopathy related to UTI and PNA noted at time of presentation, see below 3. Cont below abx 4. Improvement noted overnight. Seems near baseline today 2. UTI 1. Presenting UA pos for UTI 2. Urine culture obtained, notable for >100,000 gm neg rods 3. Continue empiric rocephin per below 4. Repeat bmet and CBC in aM 3. PNA 1. CXR personally reviewed 2. Will continue azithromycin and rocephin 3. Per staff,  food particles noted in mouth at time of presentation, giving concern of possible aspiration 4. Appreciate input by SLP, passed swallow 4. Lewy body dementia 1. Will continue to monitor. Pt noted to have baseline dementia 5. HTN 1. BP stable at this time 6. Influenza exposure 1. Multiple residents at facility pos for flu, including patient's room mate 2. Flu swab neg at time of presentation 3. Would continue total 10 days of empiric tamiflu for prophylaxis, renally dosed per pharmacy   DVT prophylaxis: Lovenox subQ Code Status: DNR Family Communication: Pt in room, family at bedside Disposition Plan: SNF, pending bed availability  Consultants:     Procedures:     Antimicrobials: Anti-infectives (From admission, onward)   Start     Dose/Rate Route Frequency Ordered Stop   08/03/18 1000  oseltamivir (TAMIFLU) capsule 30 mg     30 mg Oral Daily 08/02/18 1412 08/11/18 0959   08/02/18 1600  cefTRIAXone (ROCEPHIN) 1 g in sodium chloride 0.9 % 100 mL IVPB     1 g 200 mL/hr over 30 Minutes Intravenous Every 24 hours 08/01/18 1830 08/09/18 1559   08/01/18 2215  azithromycin (ZITHROMAX) 500 mg in sodium chloride 0.9 % 250 mL IVPB     500 mg 250 mL/hr over 60 Minutes Intravenous Daily at bedtime 08/01/18 1830 08/08/18 2159   08/01/18 2215  oseltamivir (TAMIFLU) capsule 75 mg  Status:  Discontinued     75 mg Oral Daily 08/01/18 1830 08/02/18 1413   08/01/18 1645  metroNIDAZOLE (FLAGYL) IVPB 500 mg     500 mg 100 mL/hr over 60 Minutes Intravenous  Once 08/01/18 1639 08/01/18 1825   08/01/18 1645  doxycycline (VIBRAMYCIN) 100 mg in sodium chloride 0.9 % 250 mL IVPB  Status:  Discontinued     100 mg 125 mL/hr over 120 Minutes Intravenous  Once 08/01/18 1639 08/01/18 1830   08/01/18 1630  cefTRIAXone (ROCEPHIN) 1 g in sodium chloride 0.9 % 100 mL IVPB     1 g 200 mL/hr over 30 Minutes Intravenous  Once 08/01/18 1624 08/01/18 1715       Subjective: More alert, baseline  confused  Objective: Vitals:   08/01/18 2030 08/01/18 2220 08/02/18 0521 08/02/18 1531  BP: 119/67 (!) 170/69 126/76 130/68  Pulse:  71 82 92  Resp: 13 16 16 16   Temp:  98.1 F (36.7 C) 98.3 F (36.8 C) 99.5 F (37.5 C)  TempSrc:  Axillary Oral Oral  SpO2:  96% 97% 96%  Weight:      Height:        Intake/Output Summary (Last 24 hours) at 08/02/2018 1736 Last data filed at 08/02/2018 1702 Gross per 24 hour  Intake 737 ml  Output 400 ml  Net 337 ml   Filed Weights   08/01/18 1437  Weight: 53.1 kg    Examination:  General exam: Appears calm and comfortable  Respiratory system: Clear to auscultation. Respiratory effort normal. Cardiovascular system: S1 & S2 heard, RRR Gastrointestinal system: Abdomen is nondistended, soft and nontender. No organomegaly or masses felt. Normal bowel sounds heard. Central nervous system: Alert, confused. No focal neurological deficits. Extremities: Symmetric 5 x 5 power. Skin: No rashes, lesions  Psychiatry: difficult to assess given patient's mental state  Data Reviewed: I have personally reviewed following labs and imaging studies  CBC: Recent Labs  Lab 08/01/18 1448 08/01/18 1828 08/02/18 0543  WBC 5.6 7.0 5.0  NEUTROABS 3.2  --   --   HGB 15.6* 14.1 13.5  HCT 51.5* 46.0 44.2  MCV 93.5 94.3 93.1  PLT 196 176 170   Basic Metabolic Panel: Recent Labs  Lab 08/01/18 1448 08/01/18 1828 08/02/18 0543  NA 141  --  140  K 4.6  --  3.8  CL 105  --  107  CO2 27  --  26  GLUCOSE 88  --  79  BUN 18  --  14  CREATININE 0.57 0.54 0.45  CALCIUM 9.1  --  8.4*   GFR: Estimated Creatinine Clearance: 42.3 mL/min (by C-G formula based on SCr of 0.45 mg/dL). Liver Function Tests: Recent Labs  Lab 08/01/18 1448 08/02/18 0543  AST 26 25  ALT 17 16  ALKPHOS 121 102  BILITOT 0.4 0.9  PROT 7.4 6.1*  ALBUMIN 3.4* 2.9*   No results for input(s): LIPASE, AMYLASE in the last 168 hours. No results for input(s): AMMONIA in the last 168  hours. Coagulation Profile: No results for input(s): INR, PROTIME in the last 168 hours. Cardiac Enzymes: Recent Labs  Lab 08/01/18 1448  TROPONINI <0.03   BNP (last 3 results) No results for input(s): PROBNP in the last 8760 hours. HbA1C: No results for input(s): HGBA1C in the last 72 hours. CBG: No results for input(s): GLUCAP in the last 168 hours. Lipid Profile: No results for input(s): CHOL, HDL, LDLCALC, TRIG, CHOLHDL, LDLDIRECT in the last 72 hours. Thyroid Function Tests: No results for input(s): TSH, T4TOTAL, FREET4, T3FREE, THYROIDAB in the last 72 hours. Anemia Panel: No results for input(s): VITAMINB12, FOLATE, FERRITIN, TIBC, IRON, RETICCTPCT in the last 72 hours. Sepsis Labs: Recent Labs  Lab 08/01/18 1450  LATICACIDVEN 1.1    Recent  Results (from the past 240 hour(s))  Culture, blood (routine x 2)     Status: None (Preliminary result)   Collection Time: 08/01/18  3:19 PM  Result Value Ref Range Status   Specimen Description   Final    BLOOD RIGHT ANTECUBITAL Performed at [facility], [address] [place], [place] [address]    Special Requests   Final    BOTTLES DRAWN AEROBIC AND ANAEROBIC Blood Culture adequate volume Performed at [facility], [address], [place], [place] [address]    Culture   Final    NO GROWTH < 24 HOURS Performed at [facility], [address], [place], [place] [address]    Report Status PENDING  Incomplete  Culture, blood (routine x 2)     Status: None (Preliminary result)   Collection Time: 08/01/18  3:19 PM  Result Value Ref Range Status   Specimen Description   Final    BLOOD RIGHT WRIST Performed at [facility], [address], [place], [place] [address]    Special Requests   Final    BOTTLES DRAWN AEROBIC AND ANAEROBIC Blood Culture results may not be optimal due to an inadequate volume of blood received in culture bottles Performed at [facility], [address], [place], [place] [address]    Culture   Final    NO GROWTH < 24 HOURS Performed at [facility], [address], [place], [place] [address]    Report Status PENDING  Incomplete  Urine culture     Status: Abnormal (Preliminary result)   Collection Time: 08/01/18  3:19 PM  Result Value Ref Range Status   Specimen Description   Final    URINE, CLEAN CATCH Performed at [facility], [address], [place], [place] [address]    Special Requests   Final    NONE Performed at [facility], [address], [place], [place] [address]    Culture (A)  Final    >=100,000 COLONIES/mL GRAM NEGATIVE RODS IDENTIFICATION AND SUSCEPTIBILITIES TO FOLLOW Performed at [facility], [address], [place], [place] [address]    Report Status PENDING  Incomplete     Radiology Studies: Ct Head Wo Contrast  Result Date: 08/01/2018 CLINICAL DATA:  Altered level of consciousness. EXAM: CT HEAD WITHOUT CONTRAST TECHNIQUE: Contiguous axial images were obtained from the base of the skull through the vertex without intravenous contrast. COMPARISON:  CT head without contrast 10/27/2015 FINDINGS: Brain: Advanced atrophy and white matter disease is similar the prior exams. No acute infarct, hemorrhage, or mass lesion is present. The ventricles are of proportionate to the degree of atrophy. No significant extraaxial fluid collection is present. Vascular: No hyperdense vessel or unexpected calcification. Skull: Calvarium is intact. No focal lytic or blastic lesions are present. Sinuses/Orbits: The paranasal sinuses and mastoid air cells are clear. The globes and orbits are within normal limits. IMPRESSION: Stable advanced atrophy and white matter disease likely reflects the sequela of chronic microvascular ischemia. No acute intracranial abnormality. Electronically Signed   By: Marin Roberts M.D.   On: 08/01/2018 16:39   Dg  Chest Portable 1 View  Result Date: 08/01/2018 CLINICAL DATA:  Fever and lethargy. EXAM: PORTABLE CHEST 1 VIEW COMPARISON:  08/01/2016 FINDINGS: The cardiac silhouette, mediastinal and hilar contours are within normal limits and stable. There is moderate tortuosity and calcification of the thoracic aorta. Patchy right basilar airspace opacity  is likely an infiltrate. The left lung is clear. No pleural effusions. IMPRESSION: Right basilar infiltrate. Electronically Signed   By: Rudie Meyer M.D.   On: 08/01/2018 16:25    Scheduled Meds: . enoxaparin (LOVENOX) injection  40 mg Subcutaneous QHS  . [START ON 08/03/2018] oseltamivir  30 mg Oral Daily   Continuous Infusions: . azithromycin Stopped (08/02/18 0151)  . cefTRIAXone (ROCEPHIN)  IV 1 g (08/02/18 1702)     LOS: 0 days   Rickey Barbara, MD [facility] Pager On Amion  If 7PM-7AM, please contact night-coverage 08/02/2018, 5:36 PM

## 2018-08-03 DIAGNOSIS — L8990 Pressure ulcer of unspecified site, unspecified stage: Secondary | ICD-10-CM

## 2018-08-03 LAB — URINE CULTURE: Culture: >=100000 — AB

## 2018-08-03 MED ORDER — OSELTAMIVIR PHOSPHATE 30 MG PO CAPS: 30.0000 mg | ORAL_CAPSULE | Freq: Every day | ORAL | 0 refills | Status: AC

## 2018-08-03 MED ORDER — ADULT MULTIVITAMIN W/MINERALS CH: 1.0000 | ORAL_TABLET | Freq: Every day | ORAL | Status: DC

## 2018-08-03 MED ORDER — AZITHROMYCIN 250 MG PO TABS: ORAL_TABLET | ORAL | 0 refills | Status: AC

## 2018-08-03 MED ORDER — FOSFOMYCIN TROMETHAMINE 3 G PO PACK
3.0000 g | PACK | Freq: Once | ORAL | Status: DC
  Filled 2018-08-03: qty 3

## 2018-08-03 MED ORDER — SODIUM CHLORIDE 0.9 % IV SOLN
1.0000 g | Freq: Two times a day (BID) | INTRAVENOUS | Status: DC
  Administered 2018-08-03: 1 g via INTRAVENOUS
  Filled 2018-08-03 (×2): qty 1

## 2018-08-03 MED ORDER — ENSURE ENLIVE PO LIQD: 237.0000 mL | Freq: Two times a day (BID) | ORAL | Status: DC

## 2018-08-03 MED ORDER — CEFDINIR 300 MG PO CAPS: 300.0000 mg | ORAL_CAPSULE | Freq: Two times a day (BID) | ORAL | 0 refills | Status: AC

## 2018-08-03 MED ORDER — JUVEN PO PACK
1.0000 | PACK | Freq: Two times a day (BID) | ORAL | Status: DC
  Filled 2018-08-03: qty 1

## 2018-08-03 NOTE — Discharge Summary (Signed)
Physician Discharge Summary  Brandy [name] HEN:[id number] DOB: 24-Dec-1929 DOA: 08/01/2018  PCP: Renford Dills, MD  Admit date: 08/01/2018 Discharge date: 08/03/2018  Admitted From: SNF Disposition:  SNF  Recommendations for Outpatient Follow-up:  1. Follow up with PCP in 1-2 weeks 2. Recommend palliative care referral at facility  Discharge Condition:Improved CODE STATUS:DNR Diet recommendation: Dysphagia 3 with thin liquids   Brief/Interim Summary: 85 y.o.femalewith medical history significant ofdementia, HTN presented to ED from facility with acute mental status change. Pt not good historian given known dementia, thus majority of history obtained form other sources. Pt noted to have increased lethargy over past several days prior to visit with report of fevers. Patient noted to be in close contact with multiple other residents who tested pos for flu. Pt subsequently brought to ED for further eval  ED Course:In ED, pt noted to be encephalopathic, unable to converse initially. Pt normotensive, afebrile with no leukocytosis. Flu swab was neg for influenza. UA was consistent with UTI and CXR with findings of R sided infiltrate worrisome for PNA. Patient was given empiric rocephin, doxycycline, flagyl and given 1L IVF. Mentation did improve somewhat, closer to baseline. Given patient's advanced age, hospitalist consulted for consideration for medical admission.   Discharge Diagnoses:  Principal Problem:   Toxic metabolic encephalopathy Active Problems:   Acute lower UTI   Senile dementia, uncomplicated (HCC)   Essential hypertension, benign   Pneumonia, organism unspecified(486)   Lewy body dementia without behavioral disturbance (HCC)   Pneumonia   1. Toxic metabolic encephalopathy, acute 1. Presented with acute mental status change at facility involving decreased level of alertness 2. Suspect encephalopathy related to UTI and PNA noted at time of presentation, see  below 3. Cont below abx 4. Improvement noted overnight. Seems near baseline today 2. ESBL ecoli UTI 1. Presenting UA pos for UTI 2. Urine culture obtained, notable for >100,000 ESBL ecoli 3. Had been on empiric rocephin. Transitione to IV meropenem, will give one dose of fosfomycin prior to d/c 3. PNA 1. CXR personally reviewed 2. Will continue azithromycin. Rocephin changed to meropenem per above. Will complete 3 more days of abx with po azithromycin plus omnicef on discharge 3. Per staff, food particles noted in mouth at time of presentation, giving concern of possible aspiration 4. Appreciate input by SLP, passed swallow, recommendation for dysphagia 3 diet 5. Stable at present 4. Lewy body dementia 1. Will continue to monitor. Pt noted to have baseline dementia 5. HTN 1. BP stable at this time 2. Currently stable 6. Influenza exposure 1. Multiple residents at facility pos for flu, including patient's room mate 2. Flu swab neg at time of presentation 3. Would continue total 10 days of empiric tamiflu for prophylaxis, renally dosed per pharmacy   Discharge Instructions   Allergies as of 08/03/2018      Reactions   Other Other (See Comments)   CRANBERRY JUICE   Sulfonamide Derivatives Other (See Comments)   REACTION: Can't remember      Medication List    STOP taking these medications   omega-3 acid ethyl esters 1 g capsule Commonly known as:  LOVAZA     TAKE these medications   atorvastatin 10 MG tablet Commonly known as:  LIPITOR Take 10 mg by mouth daily.   azithromycin 250 MG tablet Commonly known as:  ZITHROMAX 1 tab po daily x 3 more days then stop, zero refills   cefdinir 300 MG capsule Commonly known as:  OMNICEF Take 1 capsule (300  mg total) by mouth 2 (two) times daily for 3 days.   divalproex 125 MG capsule Commonly known as:  DEPAKOTE SPRINKLE Take 250 mg by mouth daily.   levothyroxine 50 MCG tablet Commonly known as:  SYNTHROID,  LEVOTHROID Take 50 mcg by mouth daily before breakfast.   metoprolol tartrate 25 MG tablet Commonly known as:  LOPRESSOR Take 12.5 mg by mouth daily.   oseltamivir 30 MG capsule Commonly known as:  TAMIFLU Take 1 capsule (30 mg total) by mouth daily for 7 days. Start taking on:  August 04, 2018 What changed:    medication strength  how much to take   ranitidine 150 MG tablet Commonly known as:  ZANTAC Take 150 mg by mouth 2 (two) times daily.   senna-docusate 8.6-50 MG tablet Commonly known as:  Senokot-S Take 1 tablet by mouth at bedtime.   sertraline 50 MG tablet Commonly known as:  ZOLOFT Take 50 mg by mouth daily.   sertraline 25 MG tablet Commonly known as:  ZOLOFT Take 25 mg by mouth daily.      Follow-up Information    Renford Dills, MD. Schedule an appointment as soon as possible for a visit in 1 week(s).   Specialty:  Internal Medicine Contact information: [address] [place] [address] [phone]          Allergies  Allergen Reactions  . Other Other (See Comments)    CRANBERRY JUICE  . Sulfonamide Derivatives Other (See Comments)    REACTION: Can't remember     Procedures/Studies: Ct Head Wo Contrast  Result Date: 08/01/2018 CLINICAL DATA:  Altered level of consciousness. EXAM: CT HEAD WITHOUT CONTRAST TECHNIQUE: Contiguous axial images were obtained from the base of the skull through the vertex without intravenous contrast. COMPARISON:  CT head without contrast 10/27/2015 FINDINGS: Brain: Advanced atrophy and white matter disease is similar the prior exams. No acute infarct, hemorrhage, or mass lesion is present. The ventricles are of proportionate to the degree of atrophy. No significant extraaxial fluid collection is present. Vascular: No hyperdense vessel or unexpected calcification. Skull: Calvarium is intact. No focal lytic or blastic lesions are present. Sinuses/Orbits: The paranasal sinuses and mastoid air cells  are clear. The globes and orbits are within normal limits. IMPRESSION: Stable advanced atrophy and white matter disease likely reflects the sequela of chronic microvascular ischemia. No acute intracranial abnormality. Electronically Signed   By: Marin Roberts M.D.   On: 08/01/2018 16:39   Dg Chest Portable 1 View  Result Date: 08/01/2018 CLINICAL DATA:  Fever and lethargy. EXAM: PORTABLE CHEST 1 VIEW COMPARISON:  08/01/2016 FINDINGS: The cardiac silhouette, mediastinal and hilar contours are within normal limits and stable. There is moderate tortuosity and calcification of the thoracic aorta. Patchy right basilar airspace opacity is likely an infiltrate. The left lung is clear. No pleural effusions. IMPRESSION: Right basilar infiltrate. Electronically Signed   By: Rudie Meyer M.D.   On: 08/01/2018 16:25    Subjective: Confused this AM  Discharge Exam: Vitals:   08/02/18 2050 08/03/18 0507  BP: 115/70 98/65  Pulse: 75 (!) 58  Resp: 15 14  Temp: (!) 97.5 F (36.4 C) 97.6 F (36.4 C)  SpO2: 97% 96%   Vitals:   08/02/18 0521 08/02/18 1531 08/02/18 2050 08/03/18 0507  BP: 126/76 130/68 115/70 98/65  Pulse: 82 92 75 (!) 58  Resp: Temp: 98.3 F (36.8 C) 99.5 F (37.5 C) (!) 97.5 F (36.4 C)  97.6 F (36.4 C)  TempSrc: Oral Oral Oral Oral  SpO2: 97% 96% 97% 96%  Weight:      Height:        General: Pt is alert, awake, not in acute distress Cardiovascular: RRR, S1/S2 +, no rubs, no gallops Respiratory: CTA bilaterally, no wheezing, no rhonchi Abdominal: Soft, NT, ND, bowel sounds + Extremities: no edema, no cyanosis   The results of significant diagnostics from this hospitalization (including imaging, microbiology, ancillary and laboratory) are listed below for reference.     Microbiology: Recent Results (from the past 240 hour(s))  Culture, blood (routine x 2)     Status: None (Preliminary result)   Collection Time: 08/01/18  3:19 PM  Result Value Ref  Range Status   Specimen Description BLOOD RIGHT ANTECUBITAL  Final   Special Requests   Final    BOTTLES DRAWN AEROBIC AND ANAEROBIC Blood Culture adequate volume Performed at [facility], [address], [place], [place] [address]    Culture NO GROWTH 2 DAYS  Final   Report Status PENDING  Incomplete  Culture, blood (routine x 2)     Status: None (Preliminary result)   Collection Time: 08/01/18  3:19 PM  Result Value Ref Range Status   Specimen Description BLOOD RIGHT WRIST  Final   Special Requests   Final    BOTTLES DRAWN AEROBIC AND ANAEROBIC Blood Culture results may not be optimal due to an inadequate volume of blood received in culture bottles Performed at [facility], [address], [place], [place] [address]    Culture NO GROWTH 2 DAYS  Final   Report Status PENDING  Incomplete  Urine culture     Status: Abnormal   Collection Time: 08/01/18  3:19 PM  Result Value Ref Range Status   Specimen Description   Final    URINE, CLEAN CATCH Performed at [facility], [address] [place], [place] [address]    Special Requests   Final    NONE Performed at [facility], [address] [place], [place] [address]    Culture (A)  Final    >=100,000 COLONIES/mL ESCHERICHIA COLI Confirmed Extended Spectrum Beta-Lactamase Producer (ESBL).  In bloodstream infections from ESBL organisms, carbapenems are preferred over piperacillin/tazobactam. They are shown to have a lower risk of mortality.    Report Status 08/03/2018 FINAL  Final   Organism ID, Bacteria ESCHERICHIA COLI (A)  Final      Susceptibility   Escherichia coli - MIC*    AMPICILLIN >=32 RESISTANT Resistant     CEFAZOLIN >=64 RESISTANT Resistant     CEFTRIAXONE >=64 RESISTANT Resistant     CIPROFLOXACIN >=4 RESISTANT Resistant     GENTAMICIN <=1 SENSITIVE Sensitive     IMIPENEM <=0.25 SENSITIVE Sensitive     NITROFURANTOIN <=16 SENSITIVE  Sensitive     TRIMETH/SULFA >=320 RESISTANT Resistant     AMPICILLIN/SULBACTAM >=32 RESISTANT Resistant     PIP/TAZO <=4 SENSITIVE Sensitive     Extended ESBL POSITIVE Resistant     * >=100,000 COLONIES/mL ESCHERICHIA COLI     Labs: BNP (last 3 results) No results for input(s): BNP in the last 8760 hours. Basic Metabolic Panel: Recent Labs  Lab 08/01/18 1448 08/01/18 1828 08/02/18 0543  NA 141  --  140  K 4.6  --  3.8  CL 105  --  107  CO2 27  --  26  GLUCOSE 88  --  79  BUN  18  --  14  CREATININE 0.57 0.54 0.45  CALCIUM 9.1  --  8.4*   Liver Function Tests: Recent Labs  Lab 08/01/18 1448 08/02/18 0543  AST 26 25  ALT 17 16  ALKPHOS 121 102  BILITOT 0.4 0.9  PROT 7.4 6.1*  ALBUMIN 3.4* 2.9*   No results for input(s): LIPASE, AMYLASE in the last 168 hours. No results for input(s): AMMONIA in the last 168 hours. CBC: Recent Labs  Lab 08/01/18 1448 08/01/18 1828 08/02/18 0543  WBC 5.6 7.0 5.0  NEUTROABS 3.2  --   --   HGB 15.6* 14.1 13.5  HCT 51.5* 46.0 44.2  MCV 93.5 94.3 93.1  PLT 196 176 170   Cardiac Enzymes: Recent Labs  Lab 08/01/18 1448  TROPONINI <0.03   BNP: Invalid input(s): POCBNP CBG: No results for input(s): GLUCAP in the last 168 hours. D-Dimer No results for input(s): DDIMER in the last 72 hours. Hgb A1c No results for input(s): HGBA1C in the last 72 hours. Lipid Profile No results for input(s): CHOL, HDL, LDLCALC, TRIG, CHOLHDL, LDLDIRECT in the last 72 hours. Thyroid function studies No results for input(s): TSH, T4TOTAL, T3FREE, THYROIDAB in the last 72 hours.  Invalid input(s): FREET3 Anemia work up No results for input(s): VITAMINB12, FOLATE, FERRITIN, TIBC, IRON, RETICCTPCT in the last 72 hours. Urinalysis    Component Value Date/Time   COLORURINE YELLOW 08/01/2018 1520   APPEARANCEUR CLOUDY (A) 08/01/2018 1520   LABSPEC 1.015 08/01/2018 1520   PHURINE 6.0 08/01/2018 1520   GLUCOSEU NEGATIVE 08/01/2018 1520   HGBUR  SMALL (A) 08/01/2018 1520   HGBUR negative 04/20/2010 0755   BILIRUBINUR NEGATIVE 08/01/2018 1520   BILIRUBINUR neg 12/26/2011 1627   KETONESUR NEGATIVE 08/01/2018 1520   PROTEINUR NEGATIVE 08/01/2018 1520   UROBILINOGEN 2.0 (H) 08/13/2012 1225   NITRITE POSITIVE (A) 08/01/2018 1520   LEUKOCYTESUR MODERATE (A) 08/01/2018 1520   Sepsis Labs Invalid input(s): PROCALCITONIN,  WBC,  LACTICIDVEN Microbiology Recent Results (from the past 240 hour(s))  Culture, blood (routine x 2)     Status: None (Preliminary result)   Collection Time: 08/01/18  3:19 PM  Result Value Ref Range Status   Specimen Description BLOOD RIGHT ANTECUBITAL  Final   Special Requests   Final    BOTTLES DRAWN AEROBIC AND ANAEROBIC Blood Culture adequate volume Performed at [facility], [address] [place], [place] [address]    Culture NO GROWTH 2 DAYS  Final   Report Status PENDING  Incomplete  Culture, blood (routine x 2)     Status: None (Preliminary result)   Collection Time: 08/01/18  3:19 PM  Result Value Ref Range Status   Specimen Description BLOOD RIGHT WRIST  Final   Special Requests   Final    BOTTLES DRAWN AEROBIC AND ANAEROBIC Blood Culture results may not be optimal due to an inadequate volume of blood received in culture bottles Performed at [facility], [address] [place], [place] [address]    Culture NO GROWTH 2 DAYS  Final   Report Status PENDING  Incomplete  Urine culture     Status: Abnormal   Collection Time: 08/01/18  3:19 PM  Result Value Ref Range Status   Specimen Description   Final    URINE, CLEAN CATCH Performed at [facility], [address] [place], [place] [address]    Special Requests   Final    NONE Performed at [facility], [address] [place], [place] [address]    Culture (A)  Final    >=100,000 COLONIES/mL ESCHERICHIA COLI Confirmed Extended Spectrum Beta-Lactamase Producer  (ESBL).  In bloodstream infections from ESBL organisms, carbapenems are preferred over piperacillin/tazobactam. They are shown to have a lower risk of mortality.    Report Status 08/03/2018 FINAL  Final   Organism ID, Bacteria ESCHERICHIA COLI (A)  Final      Susceptibility   Escherichia coli - MIC*    AMPICILLIN >=32 RESISTANT Resistant     CEFAZOLIN >=64 RESISTANT Resistant     CEFTRIAXONE >=64 RESISTANT Resistant     CIPROFLOXACIN >=4 RESISTANT Resistant     GENTAMICIN <=1 SENSITIVE Sensitive     IMIPENEM <=0.25 SENSITIVE Sensitive     NITROFURANTOIN <=16 SENSITIVE Sensitive     TRIMETH/SULFA >=320 RESISTANT Resistant     AMPICILLIN/SULBACTAM >=32 RESISTANT Resistant     PIP/TAZO <=4 SENSITIVE Sensitive     Extended ESBL POSITIVE Resistant     * >=100,000 COLONIES/mL ESCHERICHIA COLI   Time spent:  SIGNED:   Rickey Barbara, MD  [facility] 08/03/2018, 1:23 PM  If 7PM-7AM, please contact night-coverage

## 2018-08-03 NOTE — Progress Notes (Signed)
Initial Nutrition Assessment  DOCUMENTATION CODES:   Underweight  INTERVENTION:  Magic cup TID with meals, each supplement provides 290 kcal and 9 grams of protein Ensure Enlive po BID, each supplement provides 350 kcal and 20 grams of protein -1 packet Juven BID, each packet provides 80 calories, 8 grams of carbohydrate, and 14 grams of amino acids; supplement contains CaHMB, glutamine, and arginine, to promote wound healing   NUTRITION DIAGNOSIS:   Increased nutrient needs related to wound healing as evidenced by estimated needs.    GOAL:   Patient will meet greater than or equal to 90% of their needs   MONITOR:   PO intake, Weight trends, Supplement acceptance, Labs  REASON FOR ASSESSMENT:   Other (Comment)(wound/low BMI)    ASSESSMENT:  85 year old female presented to ED from [facility] facility with acute mental status change. PMH includes dementia, HTN, GERD, dysphagia, OSA,   Patient sleeping at time of visit, due to reported AMS RD did not attempt to wake.   Medications reviewed and include: Zithromax, Tamiflu, Lovenox  Labs: K 3.8 (L)  NUTRITION - FOCUSED PHYSICAL EXAM:  Not able to access at this time  Diet Order:   Diet Order            DIET DYS 3 Room service appropriate? Yes; Fluid consistency: Thin  Diet effective now              EDUCATION NEEDS:   No education needs have been identified at this time  Skin:  Skin Assessment: Skin Integrity Issues: Skin Integrity Issues:: Stage I Stage I: sacrum  Last BM:  2/28; type 6 (small)  Height:   Ht Readings from Last 1 Encounters:  08/01/18 5\' 8"  (1.727 m)    Weight:   Wt Readings from Last 1 Encounters:  08/01/18 53.1 kg    Ideal Body Weight:  63.6 kg  BMI:  Body mass index is 17.8 kg/m.  Estimated Nutritional Needs:   Kcal:  1350-1600  Protein:  63-74 grams  Fluid:  1.5L/day    Lars Masson, RD, LDN  After Hours/Weekend Pager: [phone]

## 2018-08-03 NOTE — Progress Notes (Signed)
Gave report to [employer] at [employer] and all questions answered

## 2018-08-03 NOTE — Clinical Social Work Placement (Signed)
   [facility].   LCSW confirmed bed with facility.   LCSW faxed dc summary.   [facility] will transport by [facility].   RN report # [phone]  BK[facility]  CLINICAL SOCIAL WORK PLACEMENT  NOTE  Date:  08/03/2018  Patient Details  Name: Brandy [name] MRN: [id number] Date of Birth: [birth year]  Clinical Social Work is seeking post-discharge placement for this patient at the Skilled  Nursing Facility level of care (*CSW will initial, date and re-position this form in  chart as items are completed):  Yes   Patient/family provided with [facility] Clinical Social Work Department's list of facilities offering this level of care within the geographic area requested by the patient (or if unable, by the patient's family).  Yes   Patient/family informed of their freedom to choose among providers that offer the needed level of care, that participate in Medicare, Medicaid or managed care program needed by the patient, have an available bed and are willing to accept the patient.  Yes   Patient/family informed of [facility]'s ownership interest in [facility] and [facility], as well as of the fact that they are under no obligation to receive care at these facilities.  PASRR submitted to EDS on       PASRR number received on       Existing PASRR number confirmed on       FL2 transmitted to all facilities in geographic area requested by pt/family on       FL2 transmitted to all facilities within larger geographic area on       Patient informed that his/her managed care company has contracts with or will negotiate with certain facilities, including the following:            Patient/family informed of bed offers received.  Patient chooses bed at [facility]     Physician recommends and patient chooses bed at      Patient to be [facility] on 08/03/18.  Patient to be transferred to facility by EMS     Patient family notified on 08/03/18 of  transfer.  Name of family member notified:  Neysa Bonito and [name]     PHYSICIAN       Additional Comment:    _______________________________________________ Coralyn Helling, LCSW 08/03/2018, 1:40 PM

## 2018-08-03 NOTE — Progress Notes (Addendum)
LCSW consulted for dc planning.   Patient from [facility] for LTC. Family wanting move at dc. Family wanting patient to be closer to [place] where her family is.   LCSW spoke with daughter Neysa Bonito and spouse [name]. LCSW explained that LTC is not typically done from the hospital.   Family has found placement and request that LCSW fax documents to new facility.   LCSW faxed documents.  LCSW will follow for dc needs.    Beulah Gandy [facility] CSW [phone]

## 2018-08-03 NOTE — NC FL2 (Signed)
[place] MEDICAID FL2 LEVEL OF CARE SCREENING TOOL     IDENTIFICATION  Patient Name: Brandy [name] Birthdate: 1929/10/02 Sex: female Admission Date (Current Location): 08/01/2018  [facility] and [place] Number:  Producer, television/film/video and Address:  [facility],  [address], [place] [address]      Provider Number: [id number]  Attending Physician Name and Address:  Jerald Kief, MD  Relative Name and Phone Number:       Current Level of Care: Hospital Recommended Level of Care: Skilled Nursing Facility Prior Approval Number:    Date Approved/Denied:   PASRR Number:    Discharge Plan: SNF    Current Diagnoses: Patient Active Problem List   Diagnosis Date Noted  . Pneumonia 08/01/2018  . Lewy body dementia without behavioral disturbance (HCC)   . Hypernatremia 08/01/2016  . Pneumonia, organism unspecified(486) 07/13/2013  . Unspecified urinary incontinence 02/16/2013  . Psychosis (HCC) 12/24/2012  . Hypothyroidism 10/18/2012  . Senile dementia, uncomplicated (HCC) 10/18/2012  . Essential hypertension, benign 10/18/2012  . Dysphagia, unspecified(787.20) 08/15/2012  . Adult failure to thrive 08/15/2012  . Unspecified constipation 08/15/2012  . Toxic metabolic encephalopathy 08/14/2012  . Acute lower UTI 08/13/2012  . Urge incontinence 12/27/2011  . Leg swelling 12/27/2011  . Ganglion cyst 06/25/2011  . Thoracic back pain 04/26/2011  . GERD 11/16/2009  . OBSTRUCTIVE SLEEP APNEA 04/17/2009  . Dementia (HCC) 04/08/2009  . ANXIETY DEPRESSION 09/26/2008  . Hyperlipidemia 03/26/2008  . COMMON MIGRAINE 03/26/2008  . ALLERGIC RHINITIS 03/26/2008    Orientation RESPIRATION BLADDER Height & Weight     Self(Responds to voice)  Normal Incontinent, External catheter Weight: 117 lb 1 oz (53.1 kg) Height:  5\' 8"  (172.7 cm)  BEHAVIORAL SYMPTOMS/MOOD NEUROLOGICAL BOWEL NUTRITION STATUS      Incontinent Diet(See dc summary)  AMBULATORY STATUS  COMMUNICATION OF NEEDS Skin   Total Care Verbally PU Stage and Appropriate Care PU Stage 1 Dressing: (Sacrum)                     Personal Care Assistance Level of Assistance  Total care       Total Care Assistance: Maximum assistance   Functional Limitations Info             SPECIAL CARE FACTORS FREQUENCY                       Contractures Contractures Info: Not present    Additional Factors Info  Code Status, Allergies Code Status Info: DNR Allergies Info: Other, Sulfonamide Derivatives           Current Medications (08/03/2018):  This is the current hospital active medication list Current Facility-Administered Medications  Medication Dose Route Frequency Provider Last Rate Last Dose  . acetaminophen (TYLENOL) tablet 650 mg  650 mg Oral Q6H PRN Jerald Kief, MD       Or  . acetaminophen (TYLENOL) suppository 650 mg  650 mg Rectal Q6H PRN Jerald Kief, MD      . azithromycin (ZITHROMAX) 500 mg in sodium chloride 0.9 % 250 mL IVPB  500 mg Intravenous QHS Jerald Kief, MD   Stopped at 08/02/18 2239  . enoxaparin (LOVENOX) injection 40 mg  40 mg Subcutaneous QHS Jerald Kief, MD   40 mg at 08/02/18 2138  . meropenem (MERREM) 1 g in sodium chloride 0.9 % 100 mL IVPB  1 g Intravenous Q12H Jerald Kief, MD      .  oseltamivir (TAMIFLU) capsule 30 mg  30 mg Oral Daily Adalberto Cole, [facility]         Discharge Medications: Please see discharge summary for a list of discharge medications.  Relevant Imaging Results:  Relevant Lab Results:   Additional Information ss#[id number]  Coralyn Helling, LCSW

## 2018-08-06 LAB — CULTURE, BLOOD (ROUTINE X 2)
Culture: NO GROWTH
Culture: NO GROWTH
Special Requests: ADEQUATE

## 2019-01-05 DEATH — deceased

## 2019-10-15 IMAGING — CT CT HEAD W/O CM
3 series · 15 of 47 positions shown, 18 images · non-contrast
Comparison: CT head without contrast 10/27/2015

CLINICAL DATA: Altered level of consciousness.

EXAM:
CT HEAD WITHOUT CONTRAST
TECHNIQUE: Contiguous axial images were obtained from the base of the skull
through the vertex without intravenous contrast.

[Series 2: head wo · axial · 0.43mm/px · z∈[-132,-7]mm · 9 of 30 slices shown, 12 images]
[im 3/30  brain]
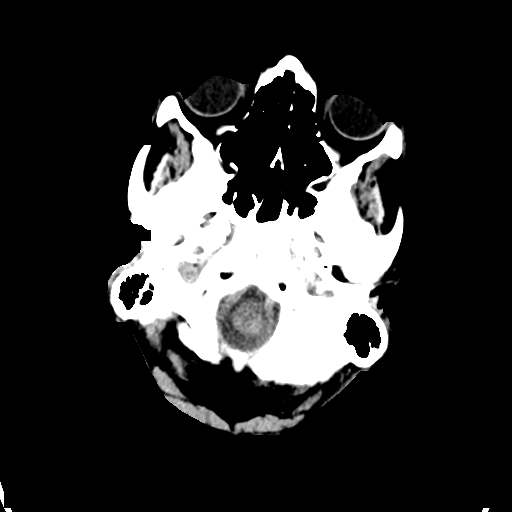
[im 3/30  bone]
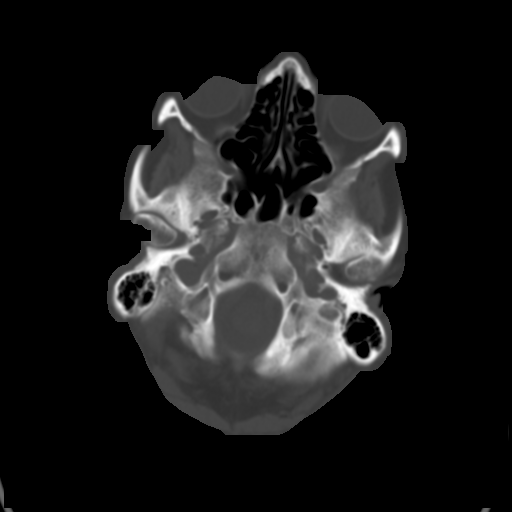
[im 6/30  brain]
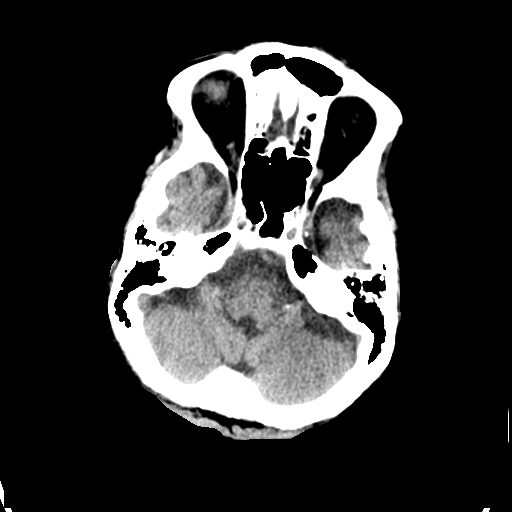
[im 9/30  brain]
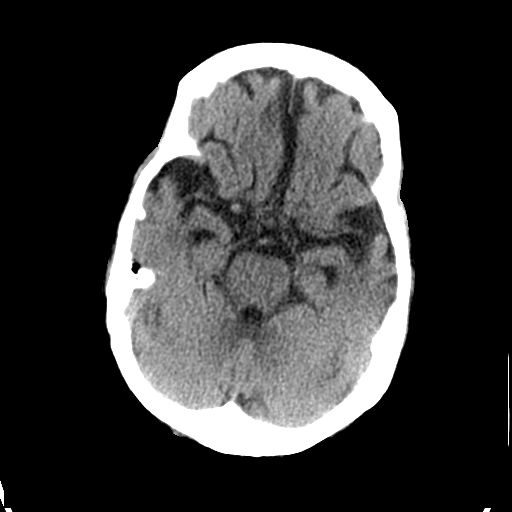
[im 12/30  brain]
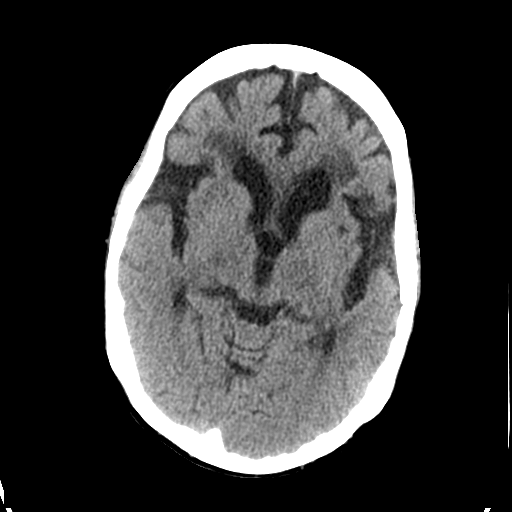
[im 16/30  brain]
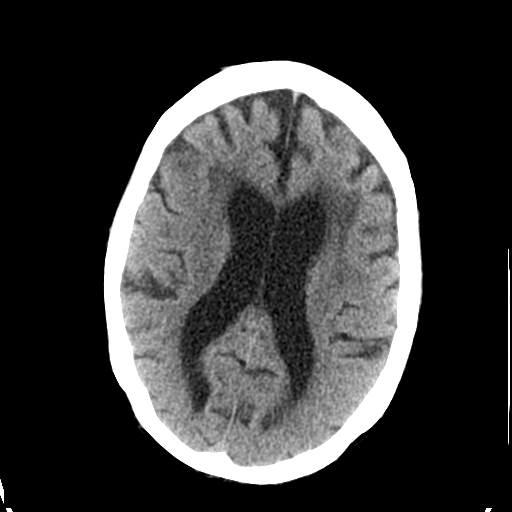
[im 16/30  bone]
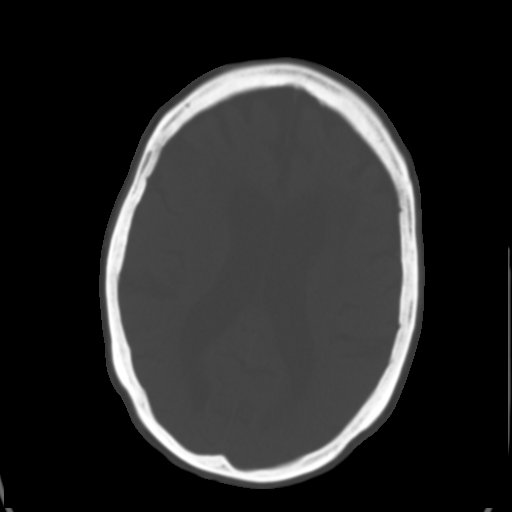
[im 19/30  brain]
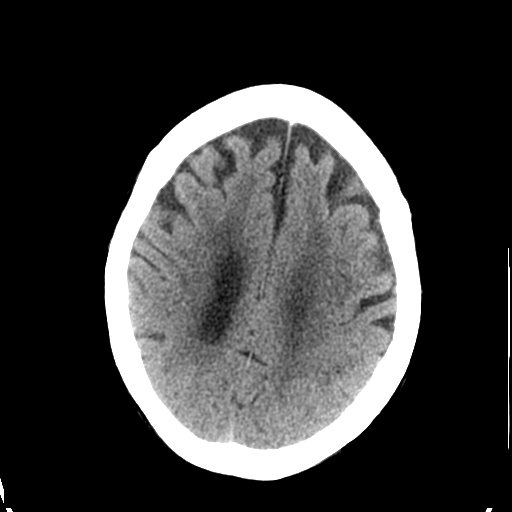
[im 22/30  brain]
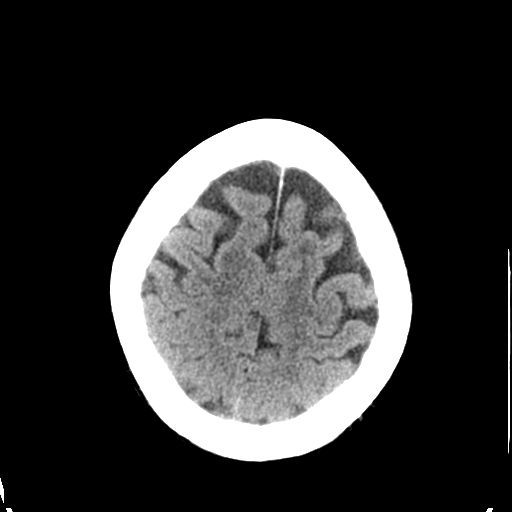
[im 25/30  brain]
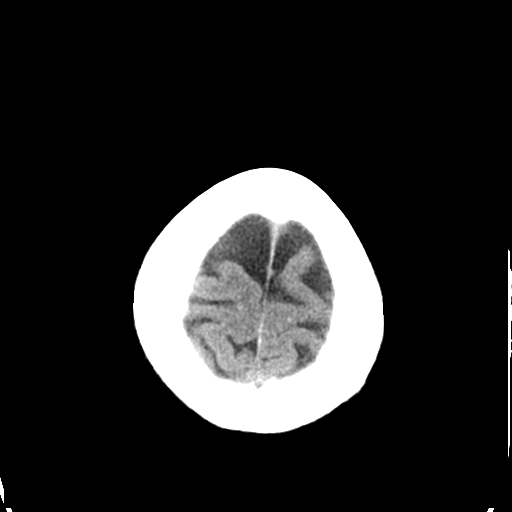
[im 28/30  brain]
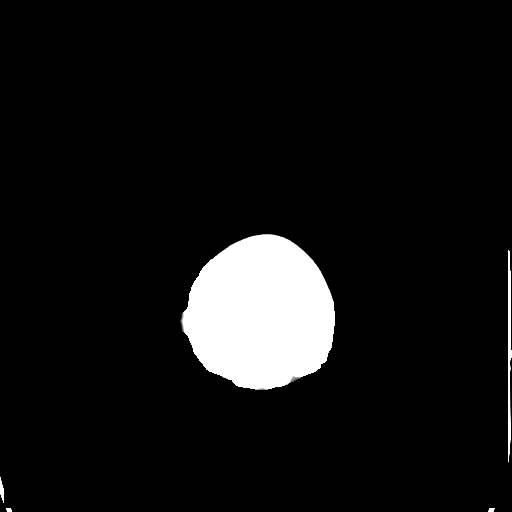
[im 28/30  bone]
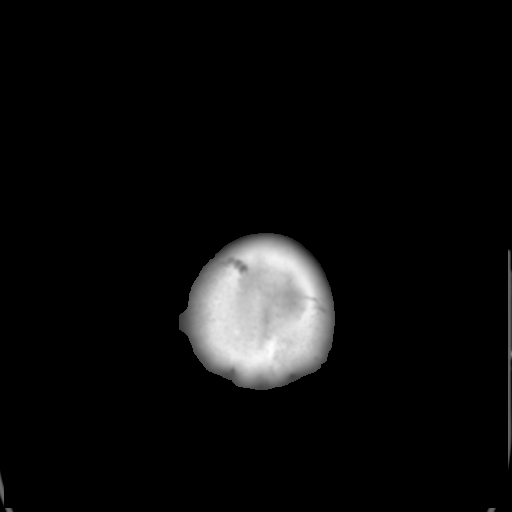

[Series 5: coronal soft tissue · coronal · 0.29mm/px · 3 of 64 slices shown]
[im 22/64  brain]
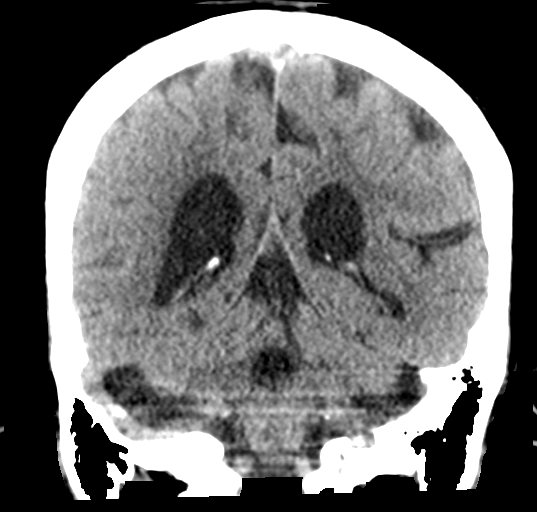
[im 29/64  brain]
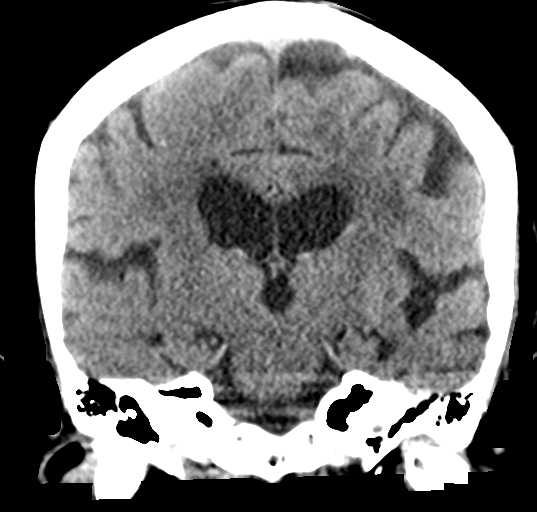
[im 36/64  brain]
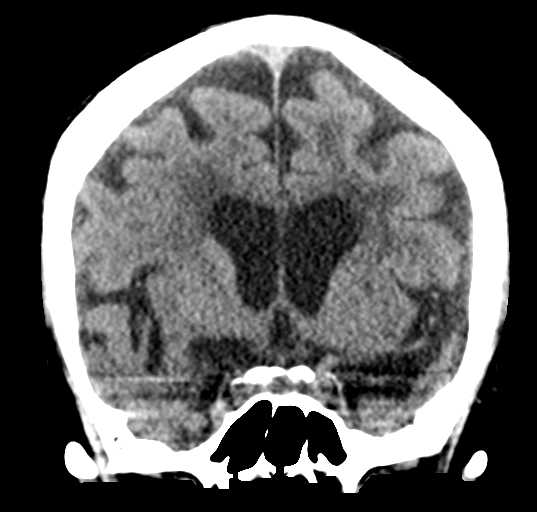

[Series 6: sagittal soft tissue · sagittal · 0.29mm/px · 3 of 49 slices shown]
[im 17/49  brain]
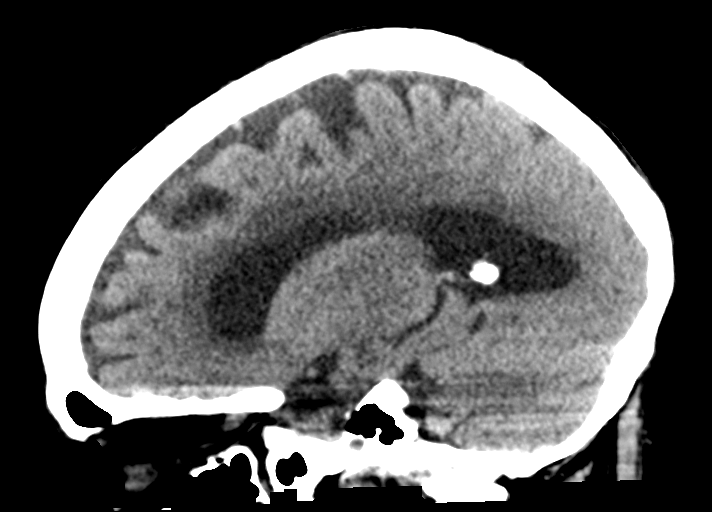
[im 25/49  brain]
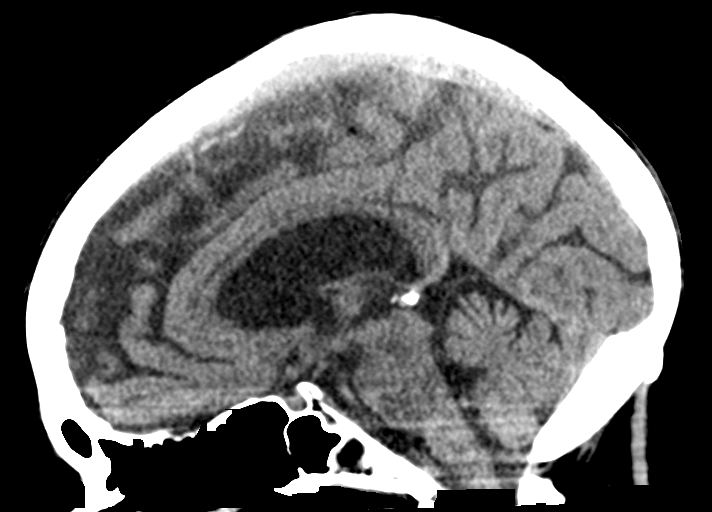
[im 33/49  brain]
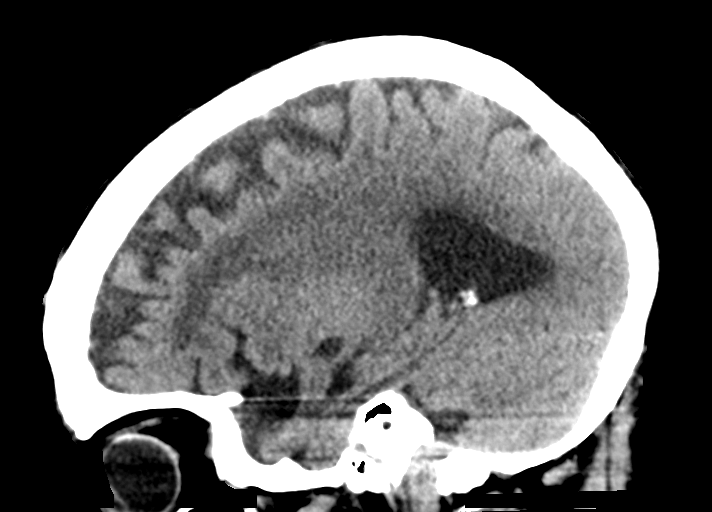

[15 of 47 positions shown; findings below may reference images not displayed]

FINDINGS: Brain: Advanced atrophy and white matter disease is similar the
prior exams. No acute infarct, hemorrhage, or mass lesion is
present. The ventricles are of proportionate to the degree of
atrophy. No significant extraaxial fluid collection is present.

Vascular: No hyperdense vessel or unexpected calcification.

Skull: Calvarium is intact. No focal lytic or blastic lesions are
present.

Sinuses/Orbits: The paranasal sinuses and mastoid air cells are
clear. The globes and orbits are within normal limits.
IMPRESSION: Stable advanced atrophy and white matter disease likely reflects the
sequela of chronic microvascular ischemia. No acute intracranial
abnormality.
